# Patient Record
Sex: Female | Born: 1980 | Race: White | Hispanic: No | Marital: Married | Smoking: Current every day smoker
Health system: Southern US, Community
[De-identification: ages and names within clinical notes are randomized; demographics above are authoritative.]

## PROBLEM LIST (undated history)

## (undated) VITALS — BP 113/73 | HR 84 | Temp 98.3°F | Resp 16 | Ht 64.5 in | Wt 186.0 lb

## (undated) VITALS — BP 110/76 | HR 88 | Temp 98.2°F | Resp 16 | Ht 63.0 in | Wt 182.0 lb

## (undated) DIAGNOSIS — K648 Other hemorrhoids: Secondary | ICD-10-CM

## (undated) DIAGNOSIS — Z87410 Personal history of cervical dysplasia: Secondary | ICD-10-CM

## (undated) DIAGNOSIS — F4310 Post-traumatic stress disorder, unspecified: Secondary | ICD-10-CM

## (undated) DIAGNOSIS — M419 Scoliosis, unspecified: Secondary | ICD-10-CM

## (undated) DIAGNOSIS — F1010 Alcohol abuse, uncomplicated: Secondary | ICD-10-CM

## (undated) DIAGNOSIS — F329 Major depressive disorder, single episode, unspecified: Secondary | ICD-10-CM

## (undated) DIAGNOSIS — K644 Residual hemorrhoidal skin tags: Secondary | ICD-10-CM

## (undated) DIAGNOSIS — K6289 Other specified diseases of anus and rectum: Secondary | ICD-10-CM

## (undated) HISTORY — PX: FOOT SURGERY: SHX648

## (undated) HISTORY — PX: PELVIC FRACTURE SURGERY: SHX119

## (undated) HISTORY — PX: KNEE SURGERY: SHX244

---
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
## 1998-03-18 ENCOUNTER — Encounter: Admission: RE | Admit: 1998-03-18 | Discharge: 1998-03-18 | Payer: Self-pay | Admitting: Family Medicine

## 1998-06-17 ENCOUNTER — Encounter: Admission: RE | Admit: 1998-06-17 | Discharge: 1998-06-17 | Payer: Self-pay | Admitting: Family Medicine

## 1998-06-27 ENCOUNTER — Encounter: Admission: RE | Admit: 1998-06-27 | Discharge: 1998-06-27 | Payer: Self-pay | Admitting: Family Medicine

## 1998-11-12 ENCOUNTER — Encounter: Admission: RE | Admit: 1998-11-12 | Discharge: 1998-11-12 | Payer: Self-pay | Admitting: Family Medicine

## 1998-12-11 ENCOUNTER — Encounter: Admission: RE | Admit: 1998-12-11 | Discharge: 1998-12-11 | Payer: Self-pay | Admitting: Family Medicine

## 1998-12-25 ENCOUNTER — Encounter: Admission: RE | Admit: 1998-12-25 | Discharge: 1998-12-25 | Payer: Self-pay | Admitting: Family Medicine

## 1999-01-01 ENCOUNTER — Encounter: Admission: RE | Admit: 1999-01-01 | Discharge: 1999-01-01 | Payer: Self-pay | Admitting: Family Medicine

## 1999-04-02 ENCOUNTER — Encounter: Admission: RE | Admit: 1999-04-02 | Discharge: 1999-04-02 | Payer: Self-pay | Admitting: Family Medicine

## 1999-07-14 ENCOUNTER — Encounter: Admission: RE | Admit: 1999-07-14 | Discharge: 1999-07-14 | Payer: Self-pay | Admitting: Family Medicine

## 1999-07-15 ENCOUNTER — Encounter: Payer: Self-pay | Admitting: Sports Medicine

## 1999-07-15 ENCOUNTER — Encounter: Admission: RE | Admit: 1999-07-15 | Discharge: 1999-07-15 | Payer: Self-pay | Admitting: Sports Medicine

## 1999-07-16 ENCOUNTER — Encounter: Admission: RE | Admit: 1999-07-16 | Discharge: 1999-07-16 | Payer: Self-pay | Admitting: Family Medicine

## 1999-07-21 ENCOUNTER — Encounter: Admission: RE | Admit: 1999-07-21 | Discharge: 1999-07-21 | Payer: Self-pay | Admitting: Family Medicine

## 1999-08-13 ENCOUNTER — Encounter: Admission: RE | Admit: 1999-08-13 | Discharge: 1999-08-13 | Payer: Self-pay | Admitting: Family Medicine

## 1999-12-15 ENCOUNTER — Other Ambulatory Visit: Admission: RE | Admit: 1999-12-15 | Discharge: 1999-12-15 | Payer: Self-pay | Admitting: Internal Medicine

## 2000-01-10 ENCOUNTER — Emergency Department (HOSPITAL_COMMUNITY): Admission: EM | Admit: 2000-01-10 | Discharge: 2000-01-10 | Payer: Self-pay | Admitting: Emergency Medicine

## 2000-01-10 ENCOUNTER — Encounter: Payer: Self-pay | Admitting: Emergency Medicine

## 2000-09-07 ENCOUNTER — Encounter: Admission: RE | Admit: 2000-09-07 | Discharge: 2000-09-07 | Payer: Self-pay | Admitting: Family Medicine

## 2000-09-27 ENCOUNTER — Other Ambulatory Visit: Admission: RE | Admit: 2000-09-27 | Discharge: 2000-09-27 | Payer: Self-pay | Admitting: *Deleted

## 2000-09-27 ENCOUNTER — Ambulatory Visit (HOSPITAL_COMMUNITY): Admission: RE | Admit: 2000-09-27 | Discharge: 2000-09-27 | Payer: Self-pay | Admitting: *Deleted

## 2000-09-27 ENCOUNTER — Encounter: Admission: RE | Admit: 2000-09-27 | Discharge: 2000-09-27 | Payer: Self-pay | Admitting: Family Medicine

## 2000-10-04 ENCOUNTER — Encounter: Admission: RE | Admit: 2000-10-04 | Discharge: 2000-10-04 | Payer: Self-pay | Admitting: Family Medicine

## 2000-10-27 ENCOUNTER — Encounter: Admission: RE | Admit: 2000-10-27 | Discharge: 2000-10-27 | Payer: Self-pay | Admitting: Family Medicine

## 2000-11-22 ENCOUNTER — Ambulatory Visit (HOSPITAL_COMMUNITY): Admission: RE | Admit: 2000-11-22 | Discharge: 2000-11-22 | Payer: Self-pay | Admitting: Family Medicine

## 2000-11-22 ENCOUNTER — Encounter: Admission: RE | Admit: 2000-11-22 | Discharge: 2000-11-22 | Payer: Self-pay | Admitting: Family Medicine

## 2000-12-20 ENCOUNTER — Encounter: Admission: RE | Admit: 2000-12-20 | Discharge: 2000-12-20 | Payer: Self-pay | Admitting: Family Medicine

## 2000-12-30 ENCOUNTER — Ambulatory Visit (HOSPITAL_COMMUNITY): Admission: RE | Admit: 2000-12-30 | Discharge: 2000-12-30 | Payer: Self-pay

## 2001-01-12 ENCOUNTER — Inpatient Hospital Stay (HOSPITAL_COMMUNITY): Admission: AD | Admit: 2001-01-12 | Discharge: 2001-01-12 | Payer: Self-pay | Admitting: Obstetrics

## 2001-02-02 ENCOUNTER — Encounter: Admission: RE | Admit: 2001-02-02 | Discharge: 2001-02-02 | Payer: Self-pay | Admitting: Family Medicine

## 2001-02-03 ENCOUNTER — Encounter: Admission: RE | Admit: 2001-02-03 | Discharge: 2001-02-03 | Payer: Self-pay | Admitting: Family Medicine

## 2001-02-14 ENCOUNTER — Encounter: Admission: RE | Admit: 2001-02-14 | Discharge: 2001-02-14 | Payer: Self-pay | Admitting: Family Medicine

## 2001-03-01 ENCOUNTER — Encounter: Admission: RE | Admit: 2001-03-01 | Discharge: 2001-03-01 | Payer: Self-pay | Admitting: Family Medicine

## 2001-03-18 ENCOUNTER — Encounter: Admission: RE | Admit: 2001-03-18 | Discharge: 2001-03-18 | Payer: Self-pay | Admitting: Family Medicine

## 2001-03-25 ENCOUNTER — Encounter: Admission: RE | Admit: 2001-03-25 | Discharge: 2001-03-25 | Payer: Self-pay | Admitting: Family Medicine

## 2001-03-26 ENCOUNTER — Inpatient Hospital Stay (HOSPITAL_COMMUNITY): Admission: AD | Admit: 2001-03-26 | Discharge: 2001-03-26 | Payer: Self-pay | Admitting: *Deleted

## 2001-03-28 ENCOUNTER — Encounter: Admission: RE | Admit: 2001-03-28 | Discharge: 2001-03-28 | Payer: Self-pay | Admitting: Family Medicine

## 2001-03-29 ENCOUNTER — Inpatient Hospital Stay (HOSPITAL_COMMUNITY): Admission: AD | Admit: 2001-03-29 | Discharge: 2001-03-29 | Payer: Self-pay | Admitting: Obstetrics

## 2001-04-06 ENCOUNTER — Encounter: Admission: RE | Admit: 2001-04-06 | Discharge: 2001-04-06 | Payer: Self-pay | Admitting: Family Medicine

## 2001-04-11 ENCOUNTER — Encounter: Admission: RE | Admit: 2001-04-11 | Discharge: 2001-04-11 | Payer: Self-pay | Admitting: Family Medicine

## 2001-04-15 ENCOUNTER — Encounter (HOSPITAL_COMMUNITY): Admission: RE | Admit: 2001-04-15 | Discharge: 2001-04-19 | Payer: Self-pay | Admitting: *Deleted

## 2001-04-19 ENCOUNTER — Encounter: Admission: RE | Admit: 2001-04-19 | Discharge: 2001-04-19 | Payer: Self-pay | Admitting: Sports Medicine

## 2001-04-21 ENCOUNTER — Inpatient Hospital Stay (HOSPITAL_COMMUNITY): Admission: AD | Admit: 2001-04-21 | Discharge: 2001-04-24 | Payer: Self-pay | Admitting: Obstetrics & Gynecology

## 2001-10-24 ENCOUNTER — Encounter: Admission: RE | Admit: 2001-10-24 | Discharge: 2001-10-24 | Payer: Self-pay | Admitting: Family Medicine

## 2001-11-09 ENCOUNTER — Other Ambulatory Visit: Admission: RE | Admit: 2001-11-09 | Discharge: 2001-11-09 | Payer: Self-pay | Admitting: *Deleted

## 2001-11-09 ENCOUNTER — Encounter: Admission: RE | Admit: 2001-11-09 | Discharge: 2001-11-09 | Payer: Self-pay | Admitting: Family Medicine

## 2001-11-28 ENCOUNTER — Encounter: Admission: RE | Admit: 2001-11-28 | Discharge: 2001-11-28 | Payer: Self-pay | Admitting: Family Medicine

## 2002-04-03 ENCOUNTER — Encounter: Admission: RE | Admit: 2002-04-03 | Discharge: 2002-04-03 | Payer: Self-pay | Admitting: Family Medicine

## 2002-06-26 ENCOUNTER — Encounter: Admission: RE | Admit: 2002-06-26 | Discharge: 2002-06-26 | Payer: Self-pay | Admitting: Sports Medicine

## 2002-06-26 ENCOUNTER — Encounter: Admission: RE | Admit: 2002-06-26 | Discharge: 2002-06-26 | Payer: Self-pay | Admitting: Family Medicine

## 2002-06-26 ENCOUNTER — Encounter: Payer: Self-pay | Admitting: Sports Medicine

## 2002-06-28 ENCOUNTER — Encounter: Admission: RE | Admit: 2002-06-28 | Discharge: 2002-06-28 | Payer: Self-pay | Admitting: Family Medicine

## 2002-11-27 ENCOUNTER — Encounter (INDEPENDENT_AMBULATORY_CARE_PROVIDER_SITE_OTHER): Payer: Self-pay | Admitting: Specialist

## 2002-11-27 ENCOUNTER — Encounter: Admission: RE | Admit: 2002-11-27 | Discharge: 2002-11-27 | Payer: Self-pay | Admitting: Sports Medicine

## 2002-11-27 ENCOUNTER — Other Ambulatory Visit: Admission: RE | Admit: 2002-11-27 | Discharge: 2002-11-27 | Payer: Self-pay | Admitting: Family Medicine

## 2002-12-04 ENCOUNTER — Encounter: Admission: RE | Admit: 2002-12-04 | Discharge: 2002-12-04 | Payer: Self-pay | Admitting: Family Medicine

## 2003-01-09 ENCOUNTER — Encounter: Admission: RE | Admit: 2003-01-09 | Discharge: 2003-01-09 | Payer: Self-pay | Admitting: Family Medicine

## 2003-02-13 ENCOUNTER — Encounter: Admission: RE | Admit: 2003-02-13 | Discharge: 2003-02-13 | Payer: Self-pay | Admitting: Family Medicine

## 2003-02-28 ENCOUNTER — Encounter: Admission: RE | Admit: 2003-02-28 | Discharge: 2003-02-28 | Payer: Self-pay | Admitting: Family Medicine

## 2003-05-15 ENCOUNTER — Encounter: Admission: RE | Admit: 2003-05-15 | Discharge: 2003-05-15 | Payer: Self-pay | Admitting: Obstetrics and Gynecology

## 2003-05-17 ENCOUNTER — Encounter: Admission: RE | Admit: 2003-05-17 | Discharge: 2003-05-17 | Payer: Self-pay | Admitting: Obstetrics and Gynecology

## 2003-06-11 ENCOUNTER — Encounter (INDEPENDENT_AMBULATORY_CARE_PROVIDER_SITE_OTHER): Payer: Self-pay | Admitting: *Deleted

## 2003-06-11 ENCOUNTER — Ambulatory Visit (HOSPITAL_COMMUNITY): Admission: RE | Admit: 2003-06-11 | Discharge: 2003-06-11 | Payer: Self-pay | Admitting: Family Medicine

## 2003-06-11 HISTORY — PX: CERVICAL BIOPSY  W/ LOOP ELECTRODE EXCISION: SUR135

## 2003-06-29 ENCOUNTER — Encounter: Admission: RE | Admit: 2003-06-29 | Discharge: 2003-06-29 | Payer: Self-pay | Admitting: Family Medicine

## 2003-09-27 ENCOUNTER — Encounter: Admission: RE | Admit: 2003-09-27 | Discharge: 2003-09-27 | Payer: Self-pay | Admitting: Sports Medicine

## 2003-12-20 ENCOUNTER — Encounter: Admission: RE | Admit: 2003-12-20 | Discharge: 2003-12-20 | Payer: Self-pay | Admitting: Family Medicine

## 2004-06-03 ENCOUNTER — Ambulatory Visit: Payer: Self-pay | Admitting: Family Medicine

## 2004-09-10 ENCOUNTER — Ambulatory Visit: Payer: Self-pay | Admitting: Family Medicine

## 2004-10-09 ENCOUNTER — Other Ambulatory Visit: Admission: RE | Admit: 2004-10-09 | Discharge: 2004-10-09 | Payer: Self-pay | Admitting: Family Medicine

## 2004-10-09 ENCOUNTER — Ambulatory Visit: Payer: Self-pay | Admitting: Family Medicine

## 2004-10-14 ENCOUNTER — Ambulatory Visit (HOSPITAL_COMMUNITY): Admission: RE | Admit: 2004-10-14 | Discharge: 2004-10-14 | Payer: Self-pay | Admitting: Family Medicine

## 2004-10-28 ENCOUNTER — Ambulatory Visit: Payer: Self-pay | Admitting: Family Medicine

## 2004-11-19 ENCOUNTER — Inpatient Hospital Stay (HOSPITAL_COMMUNITY): Admission: AD | Admit: 2004-11-19 | Discharge: 2004-11-19 | Payer: Self-pay | Admitting: Obstetrics and Gynecology

## 2004-11-25 ENCOUNTER — Ambulatory Visit: Payer: Self-pay | Admitting: Family Medicine

## 2004-12-01 ENCOUNTER — Ambulatory Visit (HOSPITAL_COMMUNITY): Admission: RE | Admit: 2004-12-01 | Discharge: 2004-12-01 | Payer: Self-pay | Admitting: Family Medicine

## 2004-12-30 ENCOUNTER — Ambulatory Visit: Payer: Self-pay | Admitting: Family Medicine

## 2005-02-02 ENCOUNTER — Ambulatory Visit: Payer: Self-pay | Admitting: Sports Medicine

## 2005-02-04 ENCOUNTER — Ambulatory Visit: Payer: Self-pay | Admitting: Family Medicine

## 2005-02-05 ENCOUNTER — Ambulatory Visit (HOSPITAL_COMMUNITY): Admission: RE | Admit: 2005-02-05 | Discharge: 2005-02-05 | Payer: Self-pay | Admitting: Family Medicine

## 2005-03-04 ENCOUNTER — Ambulatory Visit: Payer: Self-pay | Admitting: Family Medicine

## 2005-03-16 ENCOUNTER — Inpatient Hospital Stay (HOSPITAL_COMMUNITY): Admission: AD | Admit: 2005-03-16 | Discharge: 2005-03-16 | Payer: Self-pay | Admitting: Obstetrics & Gynecology

## 2005-03-16 ENCOUNTER — Ambulatory Visit: Payer: Self-pay | Admitting: *Deleted

## 2005-04-07 ENCOUNTER — Ambulatory Visit: Payer: Self-pay | Admitting: Family Medicine

## 2005-04-14 ENCOUNTER — Ambulatory Visit: Payer: Self-pay | Admitting: Family Medicine

## 2005-04-17 ENCOUNTER — Inpatient Hospital Stay (HOSPITAL_COMMUNITY): Admission: AD | Admit: 2005-04-17 | Discharge: 2005-04-17 | Payer: Self-pay | Admitting: *Deleted

## 2005-04-22 ENCOUNTER — Ambulatory Visit: Payer: Self-pay | Admitting: Family Medicine

## 2005-04-27 ENCOUNTER — Ambulatory Visit: Payer: Self-pay | Admitting: Family Medicine

## 2005-04-27 ENCOUNTER — Inpatient Hospital Stay (HOSPITAL_COMMUNITY): Admission: AD | Admit: 2005-04-27 | Discharge: 2005-04-27 | Payer: Self-pay | Admitting: Obstetrics & Gynecology

## 2005-04-30 ENCOUNTER — Ambulatory Visit: Payer: Self-pay | Admitting: Family Medicine

## 2005-05-05 ENCOUNTER — Ambulatory Visit: Payer: Self-pay | Admitting: Family Medicine

## 2005-05-06 ENCOUNTER — Ambulatory Visit: Payer: Self-pay | Admitting: Family Medicine

## 2005-05-06 ENCOUNTER — Inpatient Hospital Stay (HOSPITAL_COMMUNITY): Admission: AD | Admit: 2005-05-06 | Discharge: 2005-05-08 | Payer: Self-pay | Admitting: Obstetrics

## 2005-05-06 ENCOUNTER — Ambulatory Visit: Payer: Self-pay | Admitting: *Deleted

## 2005-05-08 HISTORY — PX: TUBAL LIGATION: SHX77

## 2005-07-07 ENCOUNTER — Ambulatory Visit: Payer: Self-pay | Admitting: Family Medicine

## 2006-07-12 ENCOUNTER — Ambulatory Visit: Payer: Self-pay | Admitting: Sports Medicine

## 2006-08-11 ENCOUNTER — Ambulatory Visit: Payer: Self-pay | Admitting: Family Medicine

## 2006-09-24 ENCOUNTER — Encounter (INDEPENDENT_AMBULATORY_CARE_PROVIDER_SITE_OTHER): Payer: Self-pay | Admitting: *Deleted

## 2006-09-24 LAB — CONVERTED CEMR LAB

## 2006-10-05 ENCOUNTER — Encounter: Payer: Self-pay | Admitting: Family Medicine

## 2006-10-05 ENCOUNTER — Encounter (INDEPENDENT_AMBULATORY_CARE_PROVIDER_SITE_OTHER): Payer: Self-pay | Admitting: Specialist

## 2006-10-05 ENCOUNTER — Ambulatory Visit: Payer: Self-pay | Admitting: Family Medicine

## 2006-10-05 LAB — CONVERTED CEMR LAB: Chlamydia, DNA Probe: NEGATIVE

## 2006-10-12 ENCOUNTER — Ambulatory Visit: Payer: Self-pay | Admitting: Family Medicine

## 2006-10-14 DIAGNOSIS — F339 Major depressive disorder, recurrent, unspecified: Secondary | ICD-10-CM | POA: Insufficient documentation

## 2006-10-15 ENCOUNTER — Encounter (INDEPENDENT_AMBULATORY_CARE_PROVIDER_SITE_OTHER): Payer: Self-pay | Admitting: *Deleted

## 2007-01-27 ENCOUNTER — Encounter: Payer: Self-pay | Admitting: Family Medicine

## 2007-06-16 ENCOUNTER — Ambulatory Visit: Payer: Self-pay | Admitting: Family Medicine

## 2007-06-16 ENCOUNTER — Telehealth (INDEPENDENT_AMBULATORY_CARE_PROVIDER_SITE_OTHER): Payer: Self-pay | Admitting: *Deleted

## 2008-03-14 ENCOUNTER — Encounter: Payer: Self-pay | Admitting: *Deleted

## 2008-03-14 ENCOUNTER — Ambulatory Visit: Payer: Self-pay | Admitting: Family Medicine

## 2008-03-14 LAB — CONVERTED CEMR LAB
Bilirubin Urine: NEGATIVE
Blood in Urine, dipstick: NEGATIVE
Ketones, urine, test strip: NEGATIVE
Nitrite: NEGATIVE
Protein, U semiquant: NEGATIVE
Urobilinogen, UA: 0.2

## 2008-03-16 ENCOUNTER — Encounter: Payer: Self-pay | Admitting: Family Medicine

## 2008-03-16 ENCOUNTER — Encounter: Payer: Self-pay | Admitting: *Deleted

## 2008-03-16 ENCOUNTER — Ambulatory Visit: Payer: Self-pay | Admitting: Family Medicine

## 2008-03-16 ENCOUNTER — Telehealth: Payer: Self-pay | Admitting: Family Medicine

## 2008-03-16 LAB — CONVERTED CEMR LAB
Beta hcg, urine, semiquantitative: NEGATIVE
Bilirubin Urine: NEGATIVE
Blood in Urine, dipstick: NEGATIVE
Ketones, urine, test strip: NEGATIVE
Protein, U semiquant: NEGATIVE
Specific Gravity, Urine: 1.015
Urobilinogen, UA: 0.2
Whiff Test: NEGATIVE

## 2008-03-17 ENCOUNTER — Encounter: Payer: Self-pay | Admitting: Family Medicine

## 2008-03-19 LAB — CONVERTED CEMR LAB
BUN: 10 mg/dL (ref 6–23)
CO2: 22 meq/L (ref 19–32)
Calcium: 9.7 mg/dL (ref 8.4–10.5)
Chlamydia, DNA Probe: NEGATIVE
Chloride: 104 meq/L (ref 96–112)
MCHC: 34.5 g/dL (ref 30.0–36.0)
RBC: 4.84 M/uL (ref 3.87–5.11)

## 2008-03-20 ENCOUNTER — Ambulatory Visit: Payer: Self-pay | Admitting: Family Medicine

## 2008-03-20 ENCOUNTER — Encounter: Payer: Self-pay | Admitting: Family Medicine

## 2008-03-20 DIAGNOSIS — M549 Dorsalgia, unspecified: Secondary | ICD-10-CM | POA: Insufficient documentation

## 2008-03-22 ENCOUNTER — Ambulatory Visit: Payer: Self-pay | Admitting: Family Medicine

## 2008-03-22 ENCOUNTER — Telehealth: Payer: Self-pay | Admitting: *Deleted

## 2008-03-22 LAB — CONVERTED CEMR LAB
Bilirubin Urine: NEGATIVE
Blood in Urine, dipstick: NEGATIVE
Glucose, Urine, Semiquant: NEGATIVE
Ketones, urine, test strip: NEGATIVE
Nitrite: NEGATIVE
Specific Gravity, Urine: <1.005
Urobilinogen, UA: 0.2
WBC Urine, dipstick: NEGATIVE
pH: 6

## 2008-03-23 ENCOUNTER — Encounter: Payer: Self-pay | Admitting: Family Medicine

## 2008-03-23 ENCOUNTER — Ambulatory Visit (HOSPITAL_COMMUNITY): Admission: RE | Admit: 2008-03-23 | Discharge: 2008-03-23 | Payer: Self-pay | Admitting: Family Medicine

## 2008-03-23 ENCOUNTER — Ambulatory Visit: Payer: Self-pay | Admitting: Family Medicine

## 2008-03-25 ENCOUNTER — Encounter: Payer: Self-pay | Admitting: Family Medicine

## 2008-04-02 ENCOUNTER — Telehealth (INDEPENDENT_AMBULATORY_CARE_PROVIDER_SITE_OTHER): Payer: Self-pay | Admitting: Family Medicine

## 2008-04-04 ENCOUNTER — Telehealth: Payer: Self-pay | Admitting: Family Medicine

## 2008-04-05 ENCOUNTER — Ambulatory Visit: Payer: Self-pay | Admitting: Family Medicine

## 2008-04-05 ENCOUNTER — Encounter: Payer: Self-pay | Admitting: Family Medicine

## 2008-04-05 ENCOUNTER — Telehealth: Payer: Self-pay | Admitting: *Deleted

## 2008-04-09 ENCOUNTER — Telehealth: Payer: Self-pay | Admitting: *Deleted

## 2008-04-10 ENCOUNTER — Ambulatory Visit: Payer: Self-pay | Admitting: Family Medicine

## 2008-04-10 ENCOUNTER — Encounter: Payer: Self-pay | Admitting: Family Medicine

## 2008-04-10 ENCOUNTER — Telehealth: Payer: Self-pay | Admitting: Family Medicine

## 2008-04-16 ENCOUNTER — Encounter: Admission: RE | Admit: 2008-04-16 | Discharge: 2008-05-10 | Payer: Self-pay | Admitting: Family Medicine

## 2008-04-24 ENCOUNTER — Ambulatory Visit: Payer: Self-pay | Admitting: Family Medicine

## 2008-06-12 ENCOUNTER — Ambulatory Visit: Payer: Self-pay | Admitting: Family Medicine

## 2008-06-13 ENCOUNTER — Telehealth: Payer: Self-pay | Admitting: Family Medicine

## 2008-07-19 ENCOUNTER — Encounter: Payer: Self-pay | Admitting: Family Medicine

## 2008-07-25 ENCOUNTER — Telehealth: Payer: Self-pay | Admitting: *Deleted

## 2008-07-25 ENCOUNTER — Encounter: Payer: Self-pay | Admitting: Family Medicine

## 2008-07-25 ENCOUNTER — Ambulatory Visit: Payer: Self-pay | Admitting: Family Medicine

## 2008-07-25 DIAGNOSIS — K625 Hemorrhage of anus and rectum: Secondary | ICD-10-CM | POA: Insufficient documentation

## 2008-07-26 ENCOUNTER — Emergency Department (HOSPITAL_COMMUNITY): Admission: EM | Admit: 2008-07-26 | Discharge: 2008-07-26 | Payer: Self-pay | Admitting: Emergency Medicine

## 2008-07-26 ENCOUNTER — Telehealth: Payer: Self-pay | Admitting: Family Medicine

## 2008-07-30 LAB — CONVERTED CEMR LAB
HCT: 46.3 % — ABNORMAL HIGH (ref 36.0–46.0)
RDW: 12.4 % (ref 11.5–15.5)
WBC: 10.3 10*3/uL (ref 4.0–10.5)

## 2008-09-05 ENCOUNTER — Telehealth: Payer: Self-pay | Admitting: Family Medicine

## 2008-09-06 ENCOUNTER — Ambulatory Visit: Payer: Self-pay | Admitting: Family Medicine

## 2008-09-19 ENCOUNTER — Telehealth: Payer: Self-pay | Admitting: Family Medicine

## 2008-09-19 ENCOUNTER — Encounter: Payer: Self-pay | Admitting: *Deleted

## 2008-10-03 ENCOUNTER — Telehealth: Payer: Self-pay | Admitting: *Deleted

## 2008-10-11 ENCOUNTER — Ambulatory Visit: Payer: Self-pay | Admitting: Family Medicine

## 2008-10-11 ENCOUNTER — Telehealth: Payer: Self-pay | Admitting: Family Medicine

## 2008-11-07 ENCOUNTER — Telehealth: Payer: Self-pay | Admitting: *Deleted

## 2008-11-07 ENCOUNTER — Telehealth: Payer: Self-pay | Admitting: Family Medicine

## 2008-12-04 ENCOUNTER — Encounter: Payer: Self-pay | Admitting: Family Medicine

## 2008-12-04 ENCOUNTER — Telehealth: Payer: Self-pay | Admitting: *Deleted

## 2009-01-04 ENCOUNTER — Encounter
Admission: RE | Admit: 2009-01-04 | Discharge: 2009-01-04 | Payer: Self-pay | Admitting: Physical Medicine & Rehabilitation

## 2009-01-07 ENCOUNTER — Telehealth: Payer: Self-pay | Admitting: Family Medicine

## 2009-05-24 IMAGING — US US ABDOMEN COMPLETE
1 series · 14 of 25 positions shown · non-contrast
Comparison: None

CLINICAL DATA: Abdominal pain.  Nephrolithiasis versus
pyelonephritis.

ABDOMEN ULTRASOUND
TECHNIQUE: Complete abdominal ultrasound examination was performed
including evaluation of the liver, gallbladder, bile ducts,
pancreas, kidneys, spleen, IVC, and abdominal aorta.

[Series 1: unknown · 0.30mm/px · 14 of 90 slices shown]
[im 1/90]
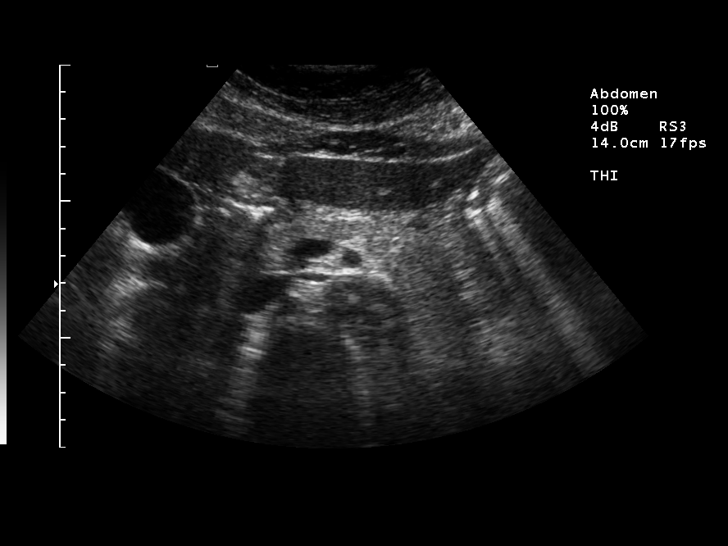
[im 8/90]
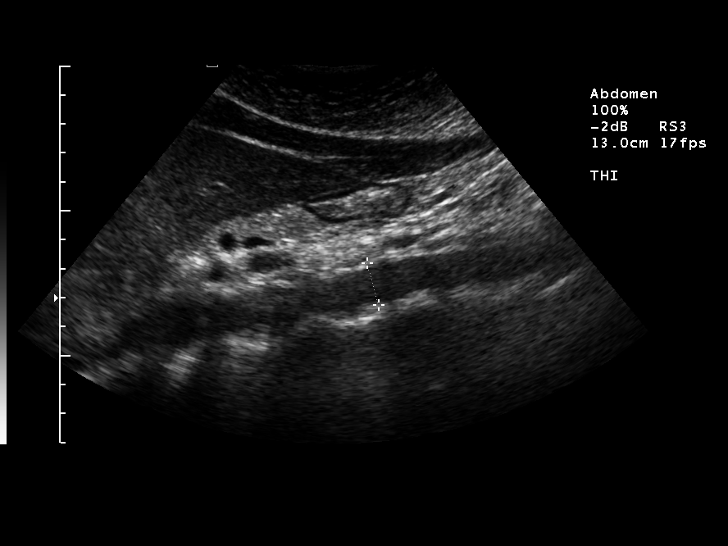
[im 15/90]
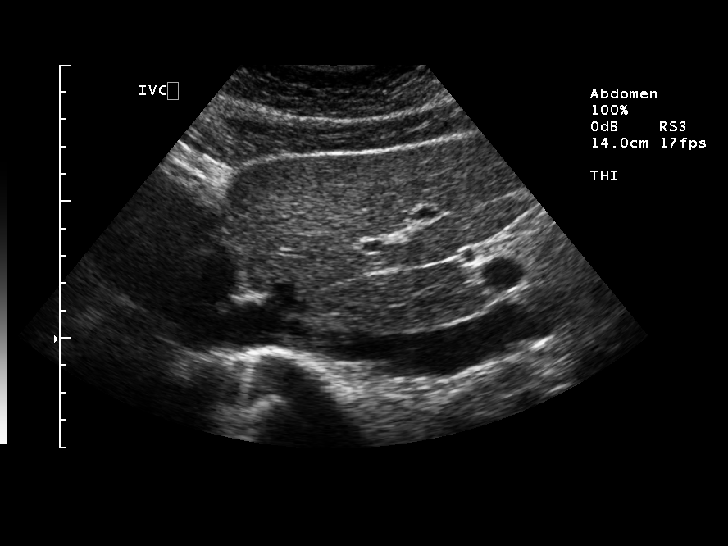
[im 23/90]
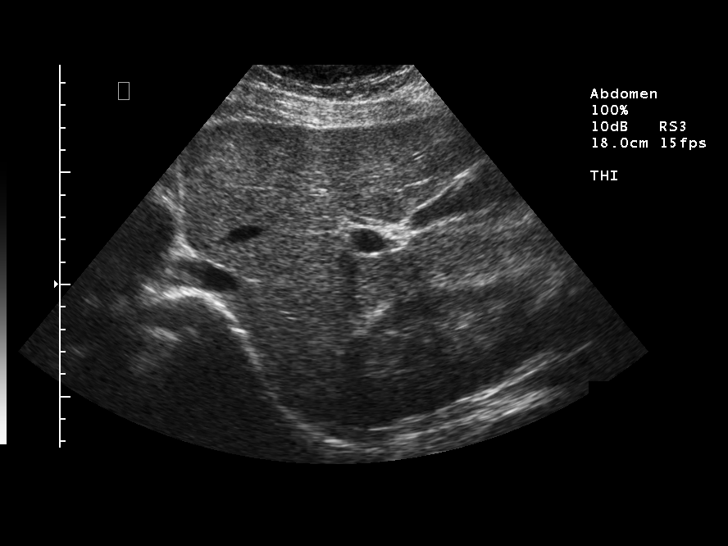
[im 30/90]
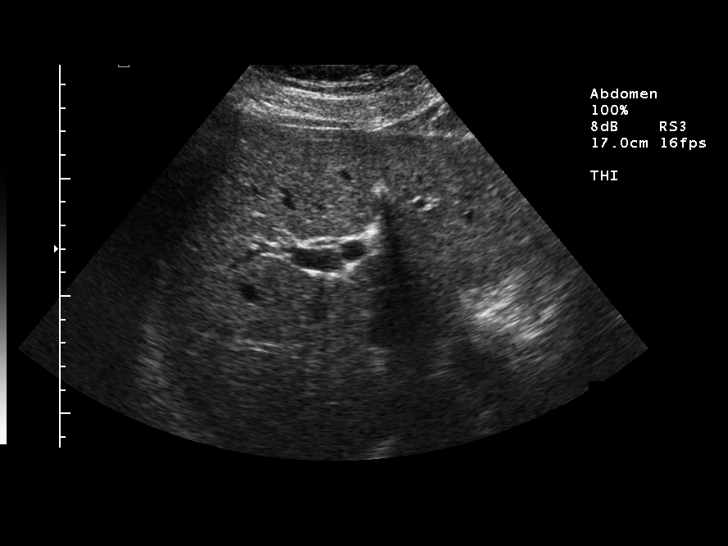
[im 34/90]
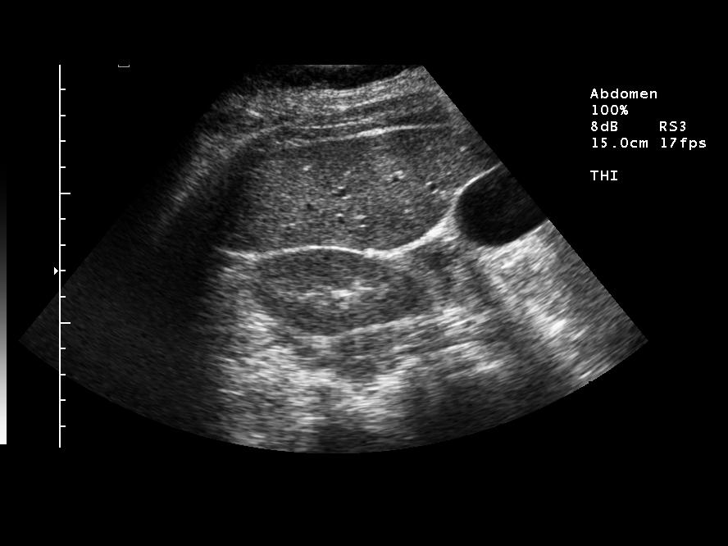
[im 41/90]
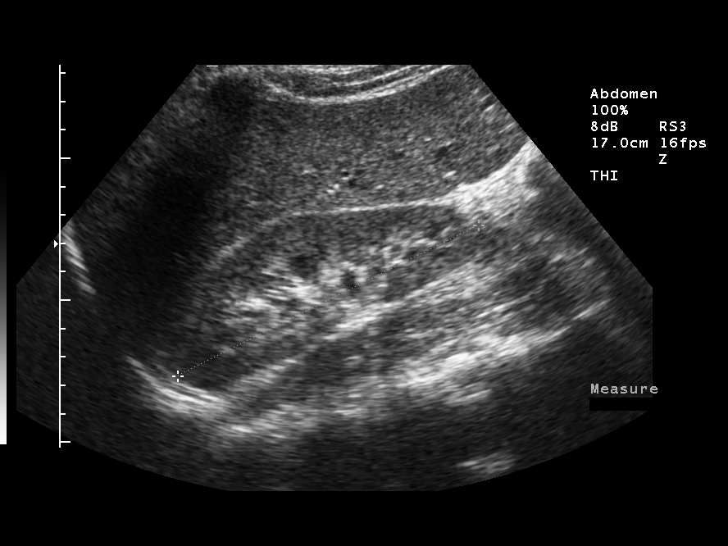
[im 49/90]
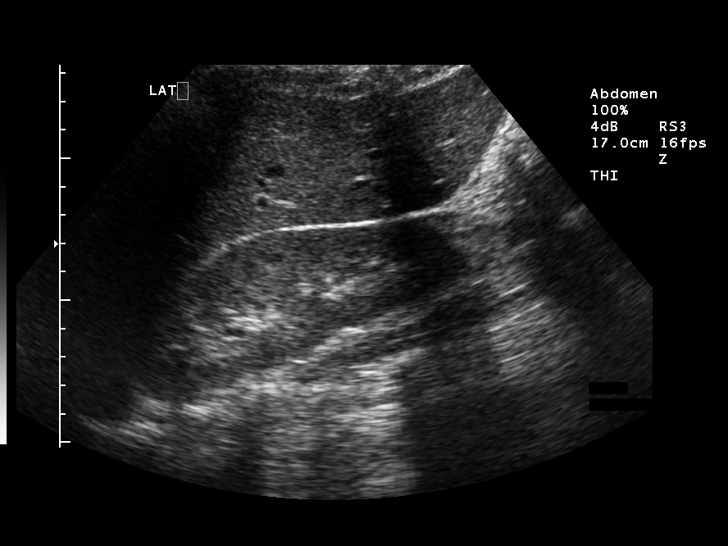
[im 56/90]
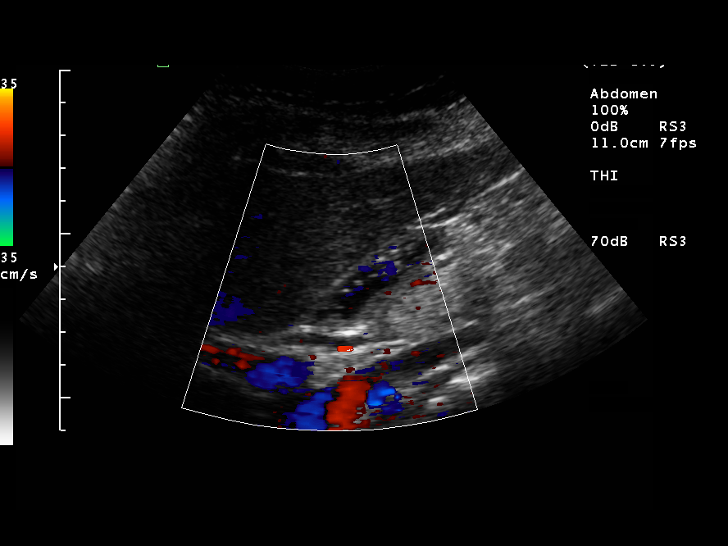
[im 60/90]
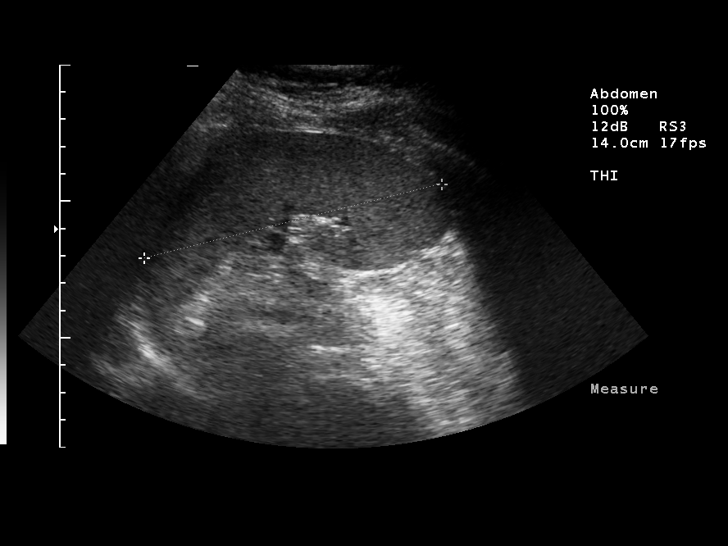
[im 67/90]
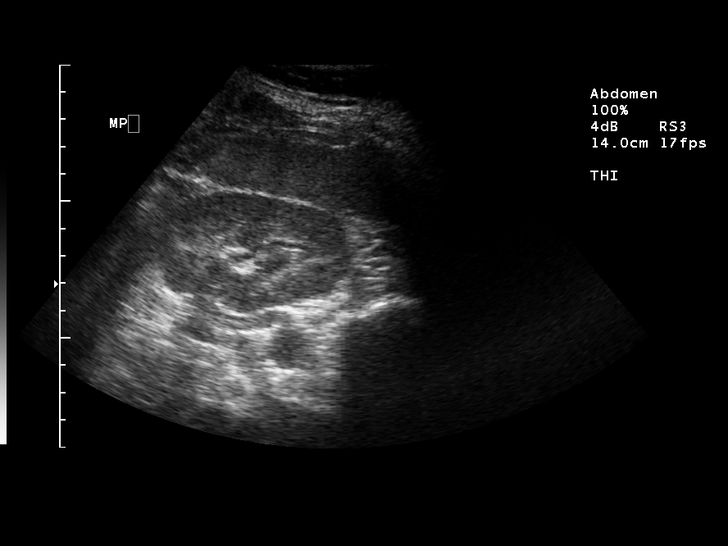
[im 75/90]
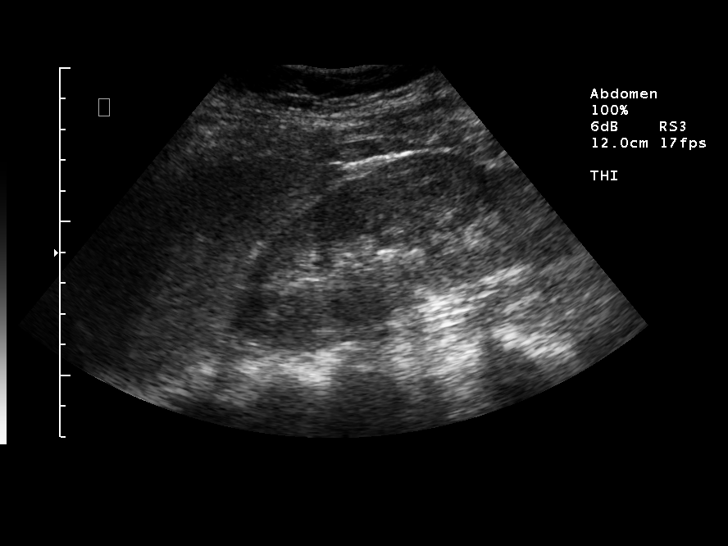
[im 82/90]
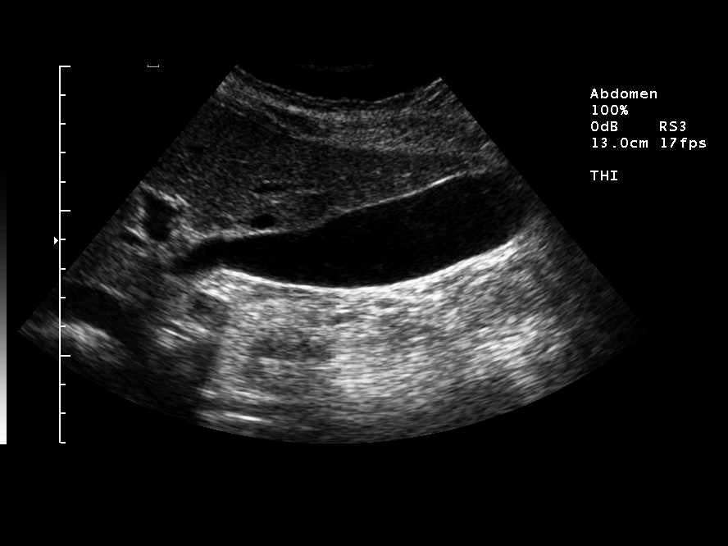
[im 90/90]
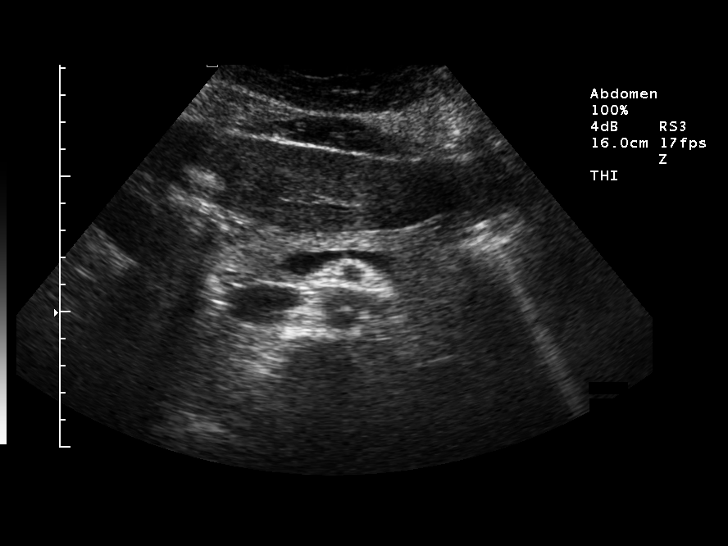

[14 of 25 positions shown; findings below may reference images not displayed]

FINDINGS: Gallbladder is normal without wall thickening, stone, or
pericholecystic fluid.  Sonographic Murphy's sign was not elicited.

Common duct normal at 4mm.

Liver, IVC, pancreas all within normal limits.

Spleen normal in size and echotexture.

Right kidney 11.8 cm and left kidney 12.2 cm.  No hydronephrosis.
No ultrasound evidence of renal calculi.

Abdominal aorta nonaneurysmal without ascites.
IMPRESSION: 1.  Normal abdominal ultrasound as described.  Please note that
ultrasound is of low sensitivity for both renal calculi and
pyelonephritis.  If these are clinical concerns, dedicated CT
should be considered.

## 2009-05-25 ENCOUNTER — Encounter (INDEPENDENT_AMBULATORY_CARE_PROVIDER_SITE_OTHER): Payer: Self-pay | Admitting: *Deleted

## 2009-05-25 DIAGNOSIS — F17200 Nicotine dependence, unspecified, uncomplicated: Secondary | ICD-10-CM | POA: Insufficient documentation

## 2009-05-25 DIAGNOSIS — Z720 Tobacco use: Secondary | ICD-10-CM | POA: Insufficient documentation

## 2009-05-28 ENCOUNTER — Encounter: Payer: Self-pay | Admitting: Family Medicine

## 2009-05-28 ENCOUNTER — Ambulatory Visit: Payer: Self-pay | Admitting: Family Medicine

## 2009-05-28 DIAGNOSIS — N938 Other specified abnormal uterine and vaginal bleeding: Secondary | ICD-10-CM | POA: Insufficient documentation

## 2009-05-28 DIAGNOSIS — N949 Unspecified condition associated with female genital organs and menstrual cycle: Secondary | ICD-10-CM

## 2009-05-28 LAB — CONVERTED CEMR LAB: Pap Smear: NORMAL

## 2009-05-30 LAB — CONVERTED CEMR LAB
Hemoglobin: 14.8 g/dL (ref 12.0–15.0)
MCHC: 32.7 g/dL (ref 30.0–36.0)
MCV: 98.1 fL (ref 78.0–100.0)
Platelets: 230 10*3/uL (ref 150–400)

## 2009-06-20 ENCOUNTER — Encounter: Payer: Self-pay | Admitting: Family Medicine

## 2009-06-20 ENCOUNTER — Ambulatory Visit: Payer: Self-pay | Admitting: Family Medicine

## 2009-06-20 LAB — CONVERTED CEMR LAB: hCG, Beta Chain, Quant, S: <2 milliintl units/mL

## 2009-06-27 ENCOUNTER — Ambulatory Visit (HOSPITAL_COMMUNITY): Admission: RE | Admit: 2009-06-27 | Discharge: 2009-06-27 | Payer: Self-pay | Admitting: Family Medicine

## 2009-07-03 ENCOUNTER — Encounter: Payer: Self-pay | Admitting: Family Medicine

## 2009-09-05 ENCOUNTER — Ambulatory Visit: Payer: Self-pay | Admitting: Family Medicine

## 2009-09-05 DIAGNOSIS — S139XXA Sprain of joints and ligaments of unspecified parts of neck, initial encounter: Secondary | ICD-10-CM | POA: Insufficient documentation

## 2010-01-27 ENCOUNTER — Encounter: Payer: Self-pay | Admitting: Family Medicine

## 2010-07-02 ENCOUNTER — Ambulatory Visit: Payer: Self-pay | Admitting: Family Medicine

## 2010-07-02 ENCOUNTER — Encounter: Payer: Self-pay | Admitting: Family Medicine

## 2010-07-02 DIAGNOSIS — F322 Major depressive disorder, single episode, severe without psychotic features: Secondary | ICD-10-CM | POA: Insufficient documentation

## 2010-07-02 DIAGNOSIS — F329 Major depressive disorder, single episode, unspecified: Secondary | ICD-10-CM | POA: Insufficient documentation

## 2010-07-28 ENCOUNTER — Ambulatory Visit: Payer: Self-pay | Admitting: Family Medicine

## 2010-07-30 ENCOUNTER — Encounter: Payer: Self-pay | Admitting: Family Medicine

## 2010-08-07 ENCOUNTER — Telehealth: Payer: Self-pay | Admitting: Family Medicine

## 2010-08-19 ENCOUNTER — Encounter: Payer: Self-pay | Admitting: Family Medicine

## 2010-08-20 LAB — CONVERTED CEMR LAB
ALT: 18 units/L (ref 0–35)
Albumin: 4.8 g/dL (ref 3.5–5.2)
Alkaline Phosphatase: 58 units/L (ref 39–117)
BUN: 10 mg/dL (ref 6–23)
Chloride: 103 meq/L (ref 96–112)
Creatinine, Ser: 0.7 mg/dL (ref 0.40–1.20)
Direct LDL: 172 mg/dL — ABNORMAL HIGH
Potassium: 4.3 meq/L (ref 3.5–5.3)

## 2010-08-28 IMAGING — US US TRANSVAGINAL NON-OB
1 series · 14 of 25 positions shown · non-contrast
Comparison: None

CLINICAL DATA: Dysfunctional uterine bleeding with bloating and
abdominal pain.  Irregular menses

TRANSABDOMINAL AND TRANSVAGINAL ULTRASOUND OF PELVIS
TECHNIQUE: Both transabdominal and transvaginal ultrasound
examinations of the pelvis were performed including evaluation of
the uterus, ovaries, adnexal regions, and pelvic cul-de-sac.

[Series 1: us transvaginal non-ob · 0.21mm/px · 14 of 39 slices shown]
[im 1/39]
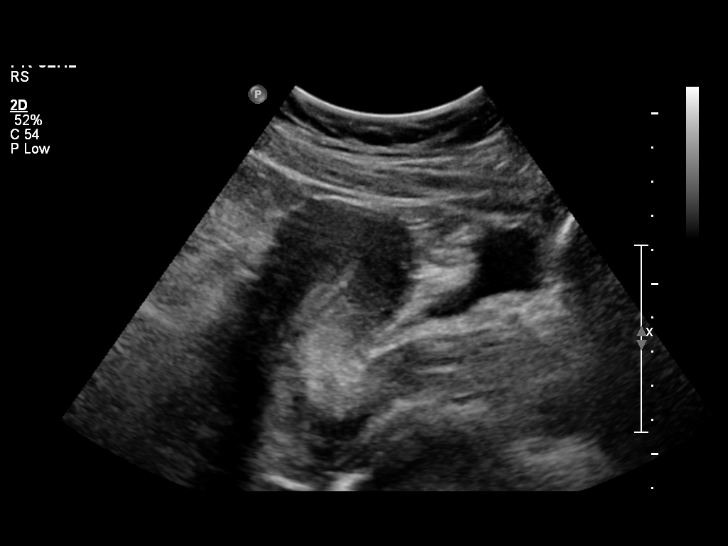
[im 4/39]
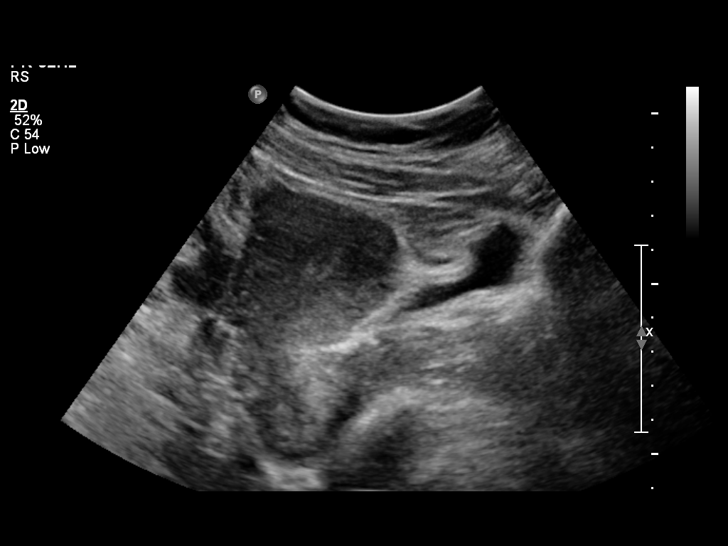
[im 7/39]
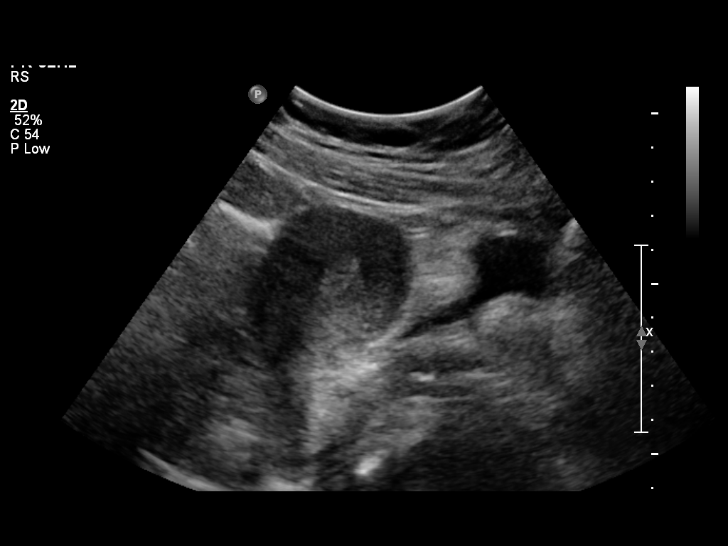
[im 10/39]
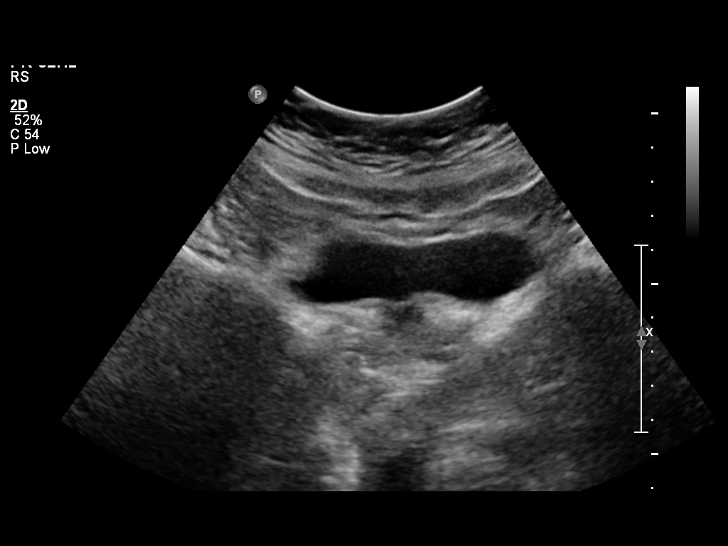
[im 13/39]
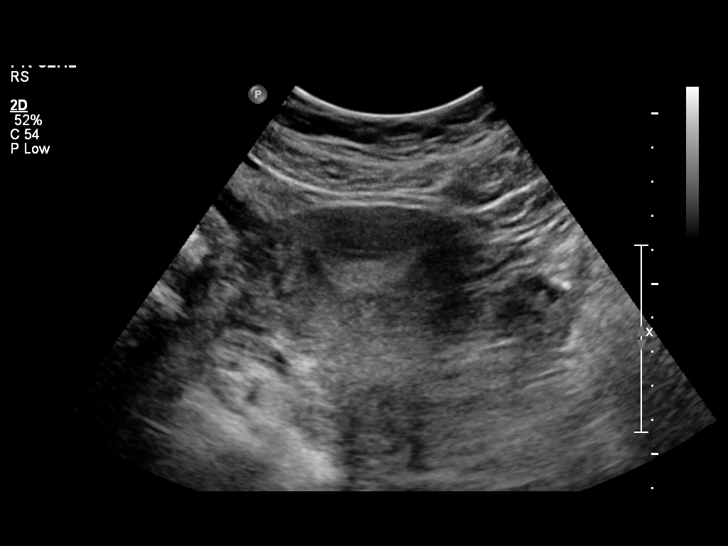
[im 15/39]
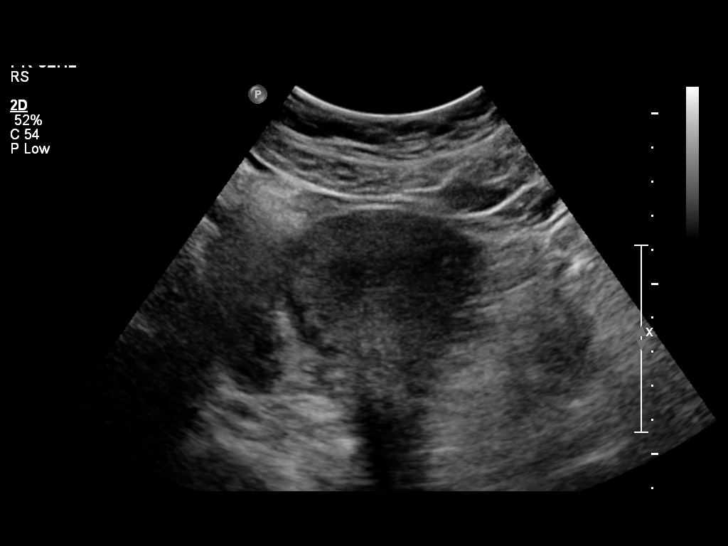
[im 18/39]
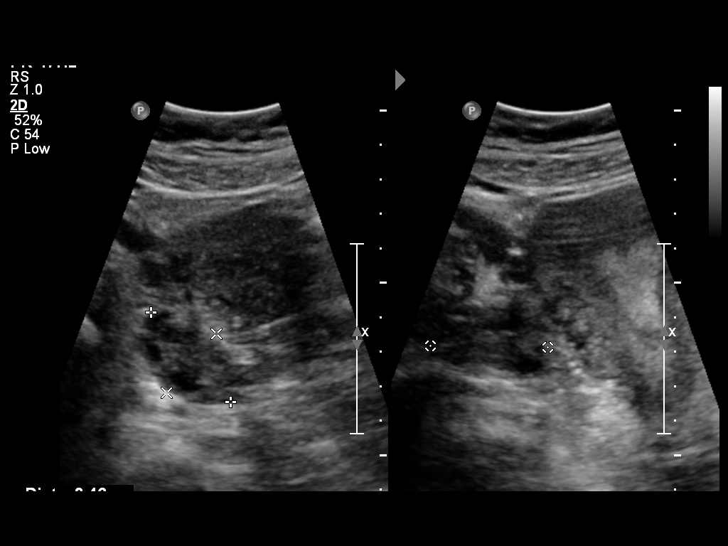
[im 21/39]
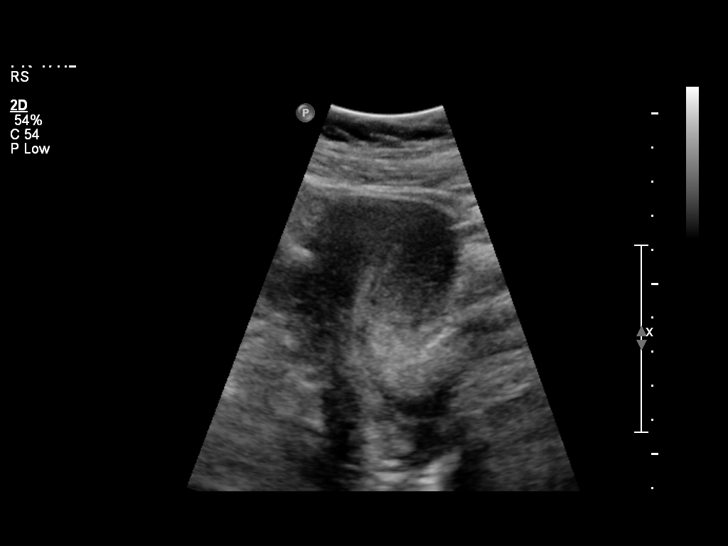
[im 24/39]
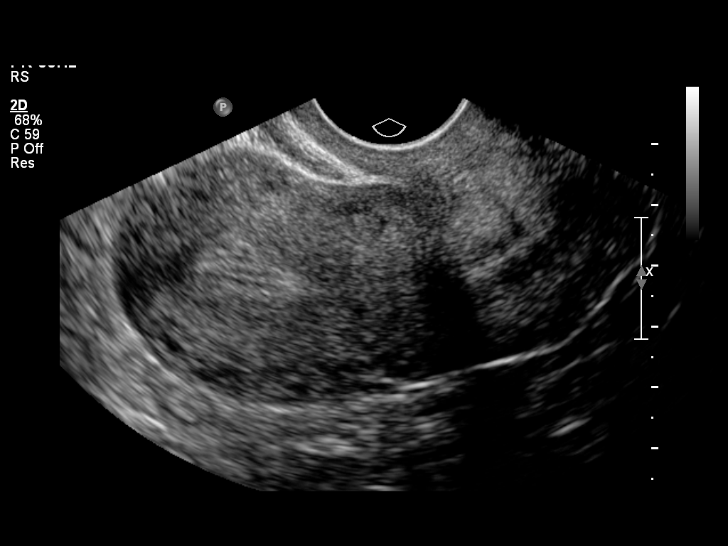
[im 26/39]
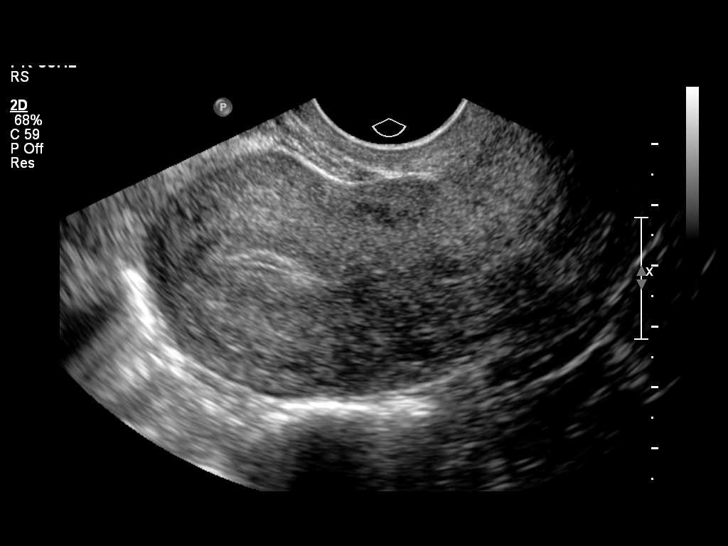
[im 29/39]
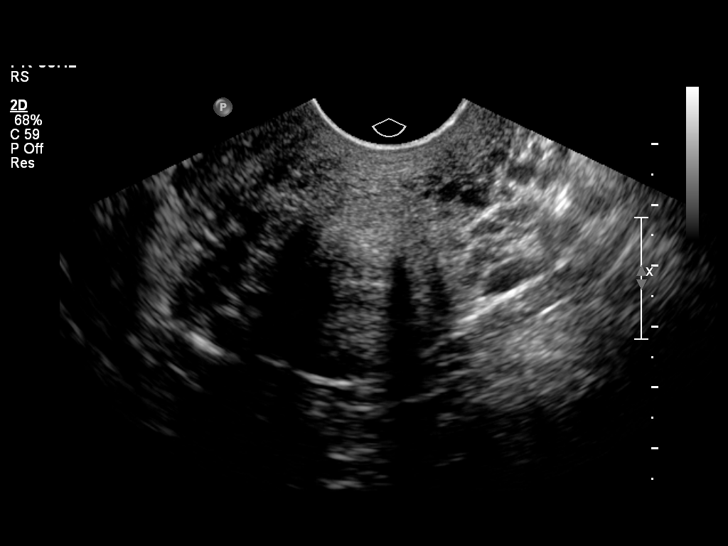
[im 32/39]
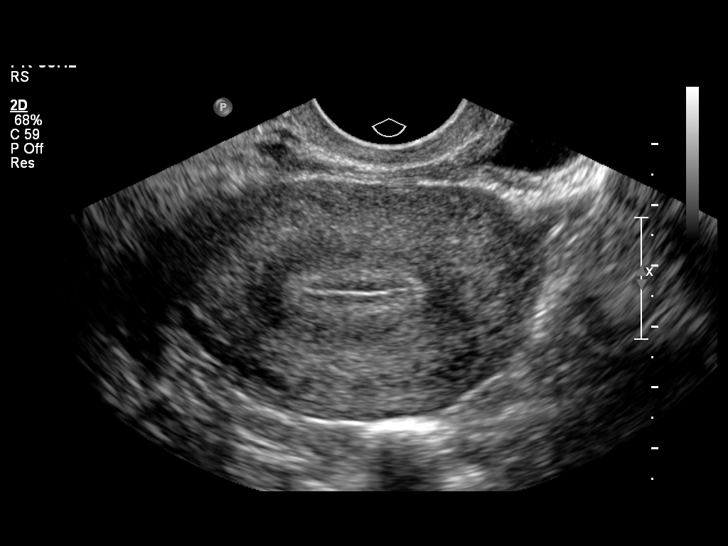
[im 35/39]
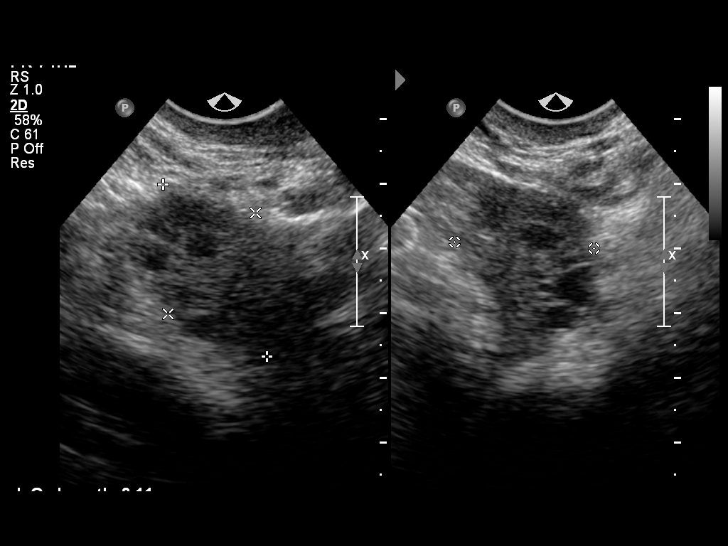
[im 39/39]
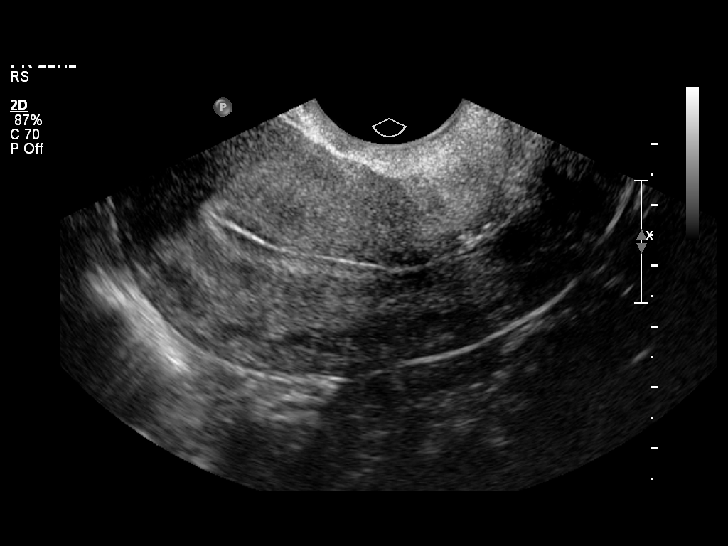

[14 of 25 positions shown; findings below may reference images not displayed]

FINDINGS: Uterus the uterus demonstrates a sagittal length of 8.4 cm, an AP
width of 4.1 cm and a transverse width of 5.2 cm.  A homogeneous
uterine myometrium is seen.

Endometrium a tri- layered endometrium is identified with an AP
width of 7.6 mm.  No areas of focal thickening or inhomogeneity are
seen.  This would suggest a periovulatory phase of the cycle.

Right Ovary the right ovary measures 3.7 x 2.5 x 3.0 cm and has a
normal appearance.

Left Ovary the left ovary measures 3.1 x 2.1 x 2.2 cm and has a
normal appearance.

Other Findings:  No pelvic fluid or separate adnexal masses are
noted.
IMPRESSION: Normal pelvic ultrasound
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
## 2010-09-16 NOTE — Miscellaneous (Signed)
Summary: chart update  Had some problems getting pt to wean off narcotics for back pain about 2 years ago. Would avoid these meds in this patient if possible.   Clinical Lists Changes  Medications: Removed medication of VICODIN 5-500 MG TABS (HYDROCODONE-ACETAMINOPHEN) 1-2 by mouth every 6 hours as needed for pain Removed medication of FLEXERIL 10 MG TABS (CYCLOBENZAPRINE HCL) 1 by mouth three times a day as needed Removed medication of NAPROXEN 500 MG TABS (NAPROXEN) 1 by mouth two times a day x 5 days

## 2010-09-16 NOTE — Assessment & Plan Note (Signed)
Summary: depression   Vital Signs:  Patient profile:   30 year old female Height:      63 inches Weight:      174.50 pounds BMI:     31.02 BSA:     1.83 Temp:     98.8 degrees F Pulse rate:   102 / minute BP sitting:   116 / 75  Vitals Entered By: Jone Baseman CMA (July 02, 2010 11:12 AM) CC: f/u Is Patient Diabetic? No Pain Assessment Patient in pain? no        Primary Care Provider:  Antoine Primas DO  CC:  f/u.  History of Present Illness: Pt states feelings of hoplesness, tearfulness, not enjoying things they used to, being more detached from family and friend, insomnia, trouble focusing, and overall feeling of fatigue.  Pt is feeling very overwhelmed with her husband out of work, her child with behavioral problems and family coming into town.  Pt states her mood is even effecting her at work.   Denies SI, HI   Still smoking, 1/2ppd     Habits & Providers  Alcohol-Tobacco-Diet     Tobacco Status: current     Tobacco Counseling: to quit use of tobacco products     Cigarette Packs/Day: 0.5  Exercise-Depression-Behavior     Depression Counseling: further diagnostic testing and/or other treatment is indicated  Current Medications (verified): 1)  Ibuprofen 800 Mg  Tabs (Ibuprofen) .Marland Kitchen.. 1 By Mouth Q8 Hrs As Needed Pain. 2)  Provera 10 Mg Tabs (Medroxyprogesterone Acetate) .... Take Once Daily For 10 Days Starting On Day 16 of Your Cycle 3)  Alprazolam 1 Mg Tabs (Alprazolam) .... Take 1 Tab By Mouth Three Times A Day Scheduled For The Next Week Then As Needed 4)  Effexor Xr 37.5 Mg Xr24h-Cap (Venlafaxine Hcl) .... Take 1 Tab By Mouth Daily For The Next Week Then 2 Tabs Daily  Allergies (verified): No Known Drug Allergies  Past History:  Past medical, surgical, family and social histories (including risk factors) reviewed, and no changes noted (except as noted below).  Past Medical History: Reviewed history from 10/14/2006 and no changes required. 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 Appendectomy - 08/17/1992, BTL 2006 - 08/31/2005  Family History: Reviewed history from 05/28/2009 and no changes required. bladder cancer - sister, Breast Ca - P. aunt,  Mom's brother - lung cancer father dead of prostate CA w/ bone mets, Migraines - sis,  prostate Ca - GF RA - mom  Social History: Reviewed history from 09/06/2008 and no changes required. 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  Separated/Divorced from first husband.  Lives w/ FOB #2 Gerilyn Pilgrim born 9/06).  Denies tob/drugs.  Occ [facility]  Review of Systems       denies fever, chills, nausea, vomiting, diarrhea or constipation   Physical Exam  General:  alert, well-developed, and well-nourished.  Tearful Eyes:  PERRL, EOMI Mouth:  MMM Lungs:  normal respiratory effort.   Heart:  normal rate.  no rhythm Abdomen:  soft and non-tender.   Pulses:  2+ Extremities:  no edema Psych:  very anxious mild flat affect, tearful   Impression & Recommendations:  Problem # 1:  DEPRESSION, MAJOR (ICD-296.20) Assessment New Pt appears for anxious and depressed, feels that side effects of SSRI may be worse,  will try effexor and see if helps.  Will titrate up after 1 week. Will give xanax for the time being but with hx of possible narcotic abuse will only use temporary. Will see again in 2-3 weeks. gave red flags of what to look for.  if effexor does not work would think mor eof wellbutrin due to maybe helping with smoking cessaastion Orders: [facility]- Est  Level 4 (52841)  Problem # 2:  TOBACCO USER (ICD-305.1) Assessment: Unchanged still not ready to quit.  Not a good time with all the stress.  Orders: [facility]- Est  Level 4 (32440)  Complete Medication List: 1)  Ibuprofen 800 Mg Tabs (Ibuprofen) .Marland Kitchen..  1 by mouth q8 hrs as needed pain. 2)  Provera 10 Mg Tabs (Medroxyprogesterone acetate) .... Take once daily for 10 days starting on day 16 of your cycle 3)  Alprazolam 1 Mg Tabs (Alprazolam) .... Take 1 tab by mouth three times a day scheduled for the next week then as needed 4)  Effexor Xr 37.5 Mg Xr24h-cap (Venlafaxine hcl) .... Take 1 tab by mouth daily for the next week then 2 tabs daily Prescriptions: ALPRAZOLAM 1 MG TABS (ALPRAZOLAM) take 1 tab by mouth three times a day scheduled for the next week then as needed  #90 x 0   Entered and Authorized by:   Antoine Primas DO   Signed by:   Antoine Primas DO on 07/02/2010   Method used:   Print then Give to Patient   RxID:   [id number] EFFEXOR XR 37.5 MG XR24H-CAP (VENLAFAXINE HCL) take 1 tab by mouth daily for the next week then 2 tabs daily  #62 x 1   Entered and Authorized by:   Antoine Primas DO   Signed by:   Antoine Primas DO on 07/02/2010   Method used:   Electronically to        [id number] ([facility])       [address] [place], [place]  [address]       Ph: [phone] or [phone]       Fax: [phone]   RxID:   [id number] ALPRAZOLAM 1 MG TABS (ALPRAZOLAM) take 1 tab by mouth three times a day scheduled for the next week then as needed  #90 x 0   Entered and Authorized by:   Antoine Primas DO   Signed by:   Antoine Primas DO on 07/02/2010   Method used:   Print then Give to Patient   RxID:   [id number]    Orders Added: 1)  [facility]- Est  Level 4 [71062]

## 2010-09-16 NOTE — Letter (Signed)
Summary: Work Engineer, production  All     ,     Phone:   Fax:     Today's Date: September 05, 2009  Name of Patient: [name]  The above named patient had a medical visit today at:  4 pm.  Please take this into consideration when reviewing the time away from work/school.    Special Instructions:  Needs to rest neck and arms for several days.  May try to return to work in 3-5 days.   Sincerely yours,      Tinnie Gens MD
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
## 2010-09-18 NOTE — Progress Notes (Signed)
  Medications Added VENLAFAXINE HCL 150 MG XR24H-TAB (VENLAFAXINE HCL) 1 tab by mouth daily       Phone Note Call from Patient   Caller: Patient Call For: [phone] Summary of Call: Patient is calling about her rx for Effexor.  Also a fax was sent to Dr. Katrinka Blazing from patient's insurance company  authorizing them to cover meds. Initial call taken by: Abundio Miu,  August 07, 2010 9:02 AM  Follow-up for Phone Call        rx done, increased to 150 xl and will see how pt will do and will see at the neginning of next year.  LM with husband.  Follow-up by: Antoine Primas DO,  August 07, 2010 9:21 AM    New/Updated Medications: VENLAFAXINE HCL 150 MG XR24H-TAB (VENLAFAXINE HCL) 1 tab by mouth daily Prescriptions: VENLAFAXINE HCL 150 MG XR24H-TAB (VENLAFAXINE HCL) 1 tab by mouth daily  #90 x 0   Entered and Authorized by:   Antoine Primas DO   Signed by:   Antoine Primas DO on 08/07/2010   Method used:   Handwritten   RxID:   [id number]

## 2010-09-18 NOTE — Miscellaneous (Signed)
Summary:  PA required    Clinical Lists Changes PA required for Venlafaxine ER because of quanity that has been pre scribed. called [employer] and they will fax form . case # [id number].  form placed in MD box.  Theresia Lo RN  July 30, 2010 11:56 AM Done to be faxed

## 2010-09-20 ENCOUNTER — Encounter: Payer: Self-pay | Admitting: *Deleted

## 2010-10-27 ENCOUNTER — Encounter: Payer: Self-pay | Admitting: Family Medicine

## 2010-10-28 ENCOUNTER — Ambulatory Visit (INDEPENDENT_AMBULATORY_CARE_PROVIDER_SITE_OTHER): Payer: 59 | Admitting: Family Medicine

## 2010-10-28 ENCOUNTER — Encounter: Payer: Self-pay | Admitting: Family Medicine

## 2010-10-28 DIAGNOSIS — F17200 Nicotine dependence, unspecified, uncomplicated: Secondary | ICD-10-CM

## 2010-10-28 DIAGNOSIS — F329 Major depressive disorder, single episode, unspecified: Secondary | ICD-10-CM

## 2010-10-28 DIAGNOSIS — M9909 Segmental and somatic dysfunction of abdomen and other regions: Secondary | ICD-10-CM | POA: Insufficient documentation

## 2010-10-28 DIAGNOSIS — M999 Biomechanical lesion, unspecified: Secondary | ICD-10-CM

## 2010-10-28 MED ORDER — VENLAFAXINE HCL ER 75 MG PO CP24: 225.0000 mg | ORAL_CAPSULE | Freq: Every day | ORAL | Status: DC

## 2010-10-28 NOTE — Assessment & Plan Note (Signed)
After verbal consent given pt did have manipulation done with HVLA and muscle energy to improve pain. Pt to f/u in 6 weeks if more is necessary given exercises to do.

## 2010-10-28 NOTE — Progress Notes (Signed)
  Subjective:    Patient ID: [name], female    DOB: 02-Sep-1980, 30 y.o.   MRN: [id number]  HPI 30 yo female here for well check. Pt is doing well physically no complaints.  Has had three normal paps last one in 2010 would like to be on the three year plan.   Depression.  Pt has been having some problem still with her depression/anxiety. Pt states she ran  Out of xanax and felt like she was going through withdrawal.  Pt is having more anxiety with her sone who is still having behavioral issues and still not getting a long with her husband.  Pt stated she has not been intimate with her husband for 5 months which bothers her, she still has the drive but he seems to push her away. Denies  Suicidal and Homicidal ideation    Smoking-  Still smoking not ready to quit yet, would look to it in the future and open to discuss it at next visit.   Pt also has had some manipulation in the past and is having a little back pain.   Review of Systems Denies fever, chills, nausea vomiting abdominal pain, dysuria, chest pain, shortness of breath dyspnea on exertion or numbness in extremities    Objective:   Physical Exam     General Appearance:    Alert, cooperative, no distress, appears stated age  Head:    Normocephalic, without obvious abnormality, atraumatic  Eyes:    PERRL, conjunctiva/corneas clear, EOM's intact, fundi    benign, both eyes  Ears:    Normal TM's and external ear canals, both ears  Nose:   Nares normal, septum midline, mucosa normal, no drainage    or sinus tenderness  Throat:   Lips, mucosa, and tongue normal; teeth and gums normal  Neck:   Supple, symmetrical, trachea midline, no adenopathy;    thyroid:  no enlargement/tenderness/nodules  Back:     Symmetric, no curvature, mild left lumbar muscle spasm  Lungs:     Clear to auscultation bilaterally, respirations unlabored  Chest Wall:    No tenderness or deformity   Heart:    Regular rate and rhythm, S1 and S2 normal, no  murmur, rub   or gallop     Abdomen:     Soft, non-tender, bowel sounds active all four quadrants,    no masses, no organomegaly        Extremities:   Extremities normal, atraumatic, no cyanosis or edema  Pulses:   2+ and symmetric all extremities  Skin:   Skin color, texture, turgor normal, no rashes or lesions     Neurologic:   CNII-XII intact, normal strength, sensation and reflexes    throughout    OMT Findings: Cervical:not examined Thoracic: T5 rotated and side bent right T7 rotated and side bent left Lumbar: L2 rotated and side bent left Sacrum: Posterior left sacrum with anterior left ilium .     Assessment & Plan:

## 2010-10-28 NOTE — Assessment & Plan Note (Signed)
Will increase Effexor to 225mg  daily, did just refill rx for xanax gave pt rx for ativan when refill comes to time to try to decrease dosing of benzo, do not fill until

## 2010-10-28 NOTE — Assessment & Plan Note (Signed)
Pt still not ready to quit yet at this time will readdress at follow up.

## 2010-10-28 NOTE — Patient Instructions (Signed)
Good to see you  We will increase to 225 of effexor Gave you prescription for Ativan to try instead jof effexor next time do not fill until the 7th of April I want to see you back in 6 weeks.

## 2010-11-17 ENCOUNTER — Telehealth: Payer: Self-pay | Admitting: Family Medicine

## 2010-11-17 NOTE — Telephone Encounter (Signed)
Called pt back states that the increase in meds is doing well but pt is wondering about her son and his psychiatry referral will send to support staff to check in on it.  Please call mom with any information once known.

## 2010-11-17 NOTE — Telephone Encounter (Signed)
Calling regarding her son Madelaine Bhat and herself.  Please call her back regarding info on psychiatrist for Adam.   Really need to speak with you on this matter and her meds.

## 2010-11-20 NOTE — Telephone Encounter (Signed)
Left message for Victorino Dike at [facility] to call me back regarding referral.

## 2010-11-25 NOTE — Telephone Encounter (Signed)
[facility] no longer has child psychiatrist available, called [facility] [phone]) and they are willing to see patient but patient has to call and make this appointment, left message on moms voicemail to call and set up appointment.

## 2010-12-09 ENCOUNTER — Encounter: Payer: Self-pay | Admitting: *Deleted

## 2011-01-02 NOTE — Op Note (Signed)
NAME:  [name], [name]                       ACCOUNT NO.:  [id number]   MEDICAL RECORD NO.:  [id number]                   PATIENT TYPE:  AMB   LOCATION:  SDC                                  [facility]ACILITY:  [facility]   PHYSICIAN:  Tanya S. Shawnie Pons, M.Reilly.                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   PROCEDURE:  Colposcopy with LEEP.   SURGEON:  Shelbie Proctor. Shawnie Pons, M.Reilly.   ANESTHESIA:  General anesthesia per Raul Del, M.Reilly.   SPECIMENS:  Cervix to pathology.   ESTIMATED BLOOD LOSS:  Less than 25 mL.   COMPLICATIONS:  None.   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  The patient also had a history of  marked anxiety disorder and is unable to have this procedure performed in  the office.  The patient agreed to have it done under sedation in the  operating room.   DESCRIPTION OF PROCEDURE:  The patient was taken to the OR where she was  placed in dorsal lithotomy and Allen stirrups after anesthesia was induced  and the patient was complete.  A Teflon coated speculum was placed inside  the vagina.  The cervix was visualized and liberally doused with acidic  acid.  Colposcopy was then performed.  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 There were some __________ as  well as some abnormal vessels there.  Colposcope was then removed from the  field and a LEEP was performed with a green loupe, taking off  ectocervix and  endocervix and two passes and one to get primarily anterior portion and one  to get primarily the posterior portion of the cervix.  The bed was then  coagulated with the electrocautery ball and good hemostasis was obtained.  The patient was then taken out of dorsal lithotomy position.  The speculum  was then removed from the patient's vagina.  The patient was taken out of  dorsal lithotomy position.  All instrument, needle, and lap counts were  correct x2.  The patient was awakened and taken to the recovery room in  stable condition.  Shelbie Proctor. Shawnie Pons, M.Reilly.   TSP/MEDQ  Reilly:  06/11/2003  T:  06/11/2003  Job:  [id number]

## 2011-01-02 NOTE — Group Therapy Note (Signed)
   NAME:  WHITLOW, Davy                         ACCOUNT NO.:  n/a   MEDICAL RECORD NO.:  [id number]                   PATIENT TYPE:   LOCATION:  [facility]                           FACILITY:   PHYSICIAN:  Tinnie Gens, MD                     DATE OF BIRTH:   DATE OF SERVICE:                                    CLINIC NOTE   CHIEF COMPLAINT:  Evaluate for LEEP.   SUBJECTIVE:  [name] is a 30 year old female who is referred to the GYN  Clinic from the [facility] for evaluation for a LEEP  procedure.  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  She was referred as the colposcopy did  not confirm the Pap smear results.  Of note, she does have a 74-year-old son.   She tells [place] that she has a phobia of procedures and had a very difficult  time tolerating her colposcopy and requests to be sedated for the LEEP  procedure.  She would like to go ahead with the LEEP as a history of cancer  runs strongly in her family and she would like to get it taken care of as  soon as possible.   OBJECTIVE:  VITAL SIGNS:  Noted.  GENERAL:  Well-developed, well-nourished, no apparent distress.   ASSESSMENT:  High grade squamous intraepithelial lesion on Pap smear not  confirmed by subsequent colposcopy.  Our staff will call the patient with an  appointment for a LEEP procedure under heavy sedation.  The procedure was  explained in detail to the patient.     Rosemarie Ax, MD                      Tinnie Gens, MD    NR/MEDQ  D:  05/17/2003  T:  05/18/2003  Job:  [id number]

## 2011-01-02 NOTE — Op Note (Signed)
NAME:  [name], [name]             ACCOUNT NO.:  [id number]   MEDICAL RECORD NO.:  [id number]          PATIENT TYPE:  INP   LOCATION:  9129                          [facility]ILITY:  [facility]   PHYSICIAN:  Tanya S. Shawnie Pons, M.D.   DATE OF BIRTH:  09/17/1980   DATE OF PROCEDURE:  05/08/2005  DATE OF DISCHARGE:                                 OPERATIVE REPORT   PREOPERATIVE DIAGNOSIS:  Multiparity, desires permanent sterilization.   POSTOPERATIVE DIAGNOSIS:  Multiparity, desires permanent sterilization.   PROCEDURE:  Postpartum tubal ligation using Filshie clips.   SURGEON:  Shelbie Proctor. Shawnie Pons, M.D.   ASSISTANT:  Marc Morgans. Mayford Knife, M.D.   ANESTHESIA:  General.   COMPLICATIONS:  None.   ESTIMATED BLOOD LOSS:  Minimal.   FLUIDS REPLACED:  750 mL Ringer's lactate.   URINE OUTPUT:  The patient voided immediately before the procedure.   FINDINGS:  Normal uterus, tubes, and ovaries.   DESCRIPTION OF PROCEDURE:  The patient was taken to the operating room where  general anesthesia was obtained without difficulty.  The patient was then  placed in the dorsal lithotomy position and prepped and draped in the usual  sterile fashion.  A small incision was made inferior to the umbilicus where  a previous scar was.  I was able to enter the intrauterine cavity using  blunt dissection.  A pack was used to help move the bowel aside.  The left  fallopian tube was grasped with a Babcock and then with another followed  down to the fimbria.  Next, in the midportion of the fallopian tube,  a  Filshie clip was used without difficulty.   Attention was turned to the right side where in a similar fashion, bowel had  to be pushed aside so that good visualization was obtained of the right  fallopian tube.  It was grasped with Babcock clamps, brought up to the  surface, grasped with a second Babcock clamp, and followed down to the  fimbria.  Next, a Filshie clip was placed in the midportion.  The packing  was removed.   Excellent hemostasis was obtained.  Next the fascia was closed  using 0  Vicryl in a running fashion.  Then 4-0 Vicryl was used to close the skin in  a subcuticular fashion.  There were no complications and the patient  tolerated the procedure well.  We also did inject 0.25% Marcaine into the  incision site prior to making the incision.     ______________________________  Marc Morgans Mayford Knife, M.D.    ______________________________  Shelbie Proctor. Shawnie Pons, M.D.    TLW/MEDQ  D:  05/08/2005  T:  05/08/2005  Job:  [id number]

## 2011-01-02 NOTE — H&P (Signed)
NAME:  [name], [name]             ACCOUNT NO.:  [id number]   MEDICAL RECORD NO.:  [id number]          PATIENT TYPE:  MAT   LOCATION:  MATC                          [facility]   PHYSICIAN:  Lesly Dukes, M.D. DATE OF BIRTH:  11-30-1980   DATE OF ADMISSION:  04/27/2005  DATE OF DISCHARGE:                                HISTORY & PHYSICAL   HISTORY OF PRESENT ILLNESS:  [name] is a 30 year old, G2, para 1-0-  0-1 female at 27 and 4 estimated week gestational age who presented for  abdominal pain.  Within several minutes of talking, she has changed her  story from having it for 2 days up to 9 months.  The pain is constant.  She  says she has had nausea and vomiting, but she has had vomiting her entire  pregnancy.  She denies diarrhea.  She also has a history of an appendectomy  in 1994.  She is not contracting, and her cervix is 1, long, and high.  The  patient has a varied exam, depending on when you examine her; sometimes it  hurts on the right side, sometimes it hurts on her ribs, sometimes it hurts  on her back, but everything hurts, but if you do distract her, I noted that  you could push harder without her wincing in pain.  She has no pain when I  jiggle the bed violently.  I explained to the patient that we would give her  pain medication after she took a urine drug screen.  I have strong suspicion  the patient is drug seeking.  When I explained this to her, the patient went  irate, jumped up off the bed, and said that she would not take a urine drug  screen, that she has great offense to this.  At this point, it was obvious  that her pain had somewhat miraculously resolved.  The patient then stormed  out.  Her husband then came back in to the emergency room.  We had a  somewhat civil conversation with [name], the R.N. present; however, as he  realized that we were not going to give her pain medication unless she  consented for a drug screen, then he became irate,  used multiple expletives.  Security was outside the room; however, the did not need to come in.  I  offered the patient to come back in and we could still follow through with  the ultrasound.  However, they said that they did not want to be seen here  anymore.  I explained if they changed their mind and would to come back in  and be examined, or receive pain medication after a urine drug screen that  could be run, then we would gladly do that.  Once again, the patient's  husbands used multiple expletives and left to go to the waiting room.           ______________________________  Lesly Dukes, M.D.     KHL/MEDQ  D:  04/27/2005  T:  04/27/2005  Job:  [id number]

## 2011-01-26 ENCOUNTER — Other Ambulatory Visit: Payer: Self-pay | Admitting: Family Medicine

## 2011-05-21 ENCOUNTER — Emergency Department (HOSPITAL_COMMUNITY): Payer: Self-pay

## 2011-05-21 ENCOUNTER — Emergency Department (HOSPITAL_COMMUNITY)
Admission: EM | Admit: 2011-05-21 | Discharge: 2011-05-22 | Disposition: A | Discharge status: Home / Self Care | Payer: Self-pay | Attending: Emergency Medicine | Admitting: Emergency Medicine

## 2011-05-21 DIAGNOSIS — R079 Chest pain, unspecified: Secondary | ICD-10-CM | POA: Insufficient documentation

## 2011-05-21 DIAGNOSIS — R509 Fever, unspecified: Secondary | ICD-10-CM | POA: Insufficient documentation

## 2011-05-21 DIAGNOSIS — R070 Pain in throat: Secondary | ICD-10-CM | POA: Insufficient documentation

## 2011-05-21 DIAGNOSIS — R05 Cough: Secondary | ICD-10-CM | POA: Insufficient documentation

## 2011-05-21 DIAGNOSIS — J3489 Other specified disorders of nose and nasal sinuses: Secondary | ICD-10-CM | POA: Insufficient documentation

## 2011-05-21 DIAGNOSIS — R5383 Other fatigue: Secondary | ICD-10-CM | POA: Insufficient documentation

## 2011-05-21 DIAGNOSIS — Z79899 Other long term (current) drug therapy: Secondary | ICD-10-CM | POA: Insufficient documentation

## 2011-05-21 DIAGNOSIS — R059 Cough, unspecified: Secondary | ICD-10-CM | POA: Insufficient documentation

## 2011-05-21 DIAGNOSIS — IMO0001 Reserved for inherently not codable concepts without codable children: Secondary | ICD-10-CM | POA: Insufficient documentation

## 2011-05-21 DIAGNOSIS — R5381 Other malaise: Secondary | ICD-10-CM | POA: Insufficient documentation

## 2011-05-21 DIAGNOSIS — B9789 Other viral agents as the cause of diseases classified elsewhere: Secondary | ICD-10-CM | POA: Insufficient documentation

## 2011-05-21 DIAGNOSIS — R42 Dizziness and giddiness: Secondary | ICD-10-CM | POA: Insufficient documentation

## 2011-05-21 DIAGNOSIS — R55 Syncope and collapse: Secondary | ICD-10-CM | POA: Insufficient documentation

## 2011-05-21 LAB — URINALYSIS, ROUTINE W REFLEX MICROSCOPIC
Bilirubin Urine: NEGATIVE
Ketones, ur: NEGATIVE mg/dL
Urobilinogen, UA: 0.2 mg/dL (ref 0.0–1.0)

## 2011-05-21 LAB — DIFFERENTIAL
Basophils Absolute: 0 10*3/uL (ref 0.0–0.1)
Basophils Relative: 0 % (ref 0–1)
Eosinophils Absolute: 0 10*3/uL (ref 0.0–0.7)
Eosinophils Relative: 0 % (ref 0–5)
Monocytes Absolute: 0.6 10*3/uL (ref 0.1–1.0)
Monocytes Relative: 6 % (ref 3–12)
Neutro Abs: 5.4 10*3/uL (ref 1.7–7.7)

## 2011-05-21 LAB — CBC
HCT: 43.8 % (ref 36.0–46.0)
Hemoglobin: 15.9 g/dL — ABNORMAL HIGH (ref 12.0–15.0)
MCH: 33.2 pg (ref 26.0–34.0)
MCV: 91.4 fL (ref 78.0–100.0)
RBC: 4.79 MIL/uL (ref 3.87–5.11)
WBC: 9.9 10*3/uL (ref 4.0–10.5)

## 2011-05-21 LAB — CK TOTAL AND CKMB (NOT AT ARMC)
Relative Index: INVALID (ref 0.0–2.5)
Total CK: 58 U/L (ref 7–177)

## 2011-05-21 LAB — COMPREHENSIVE METABOLIC PANEL
Albumin: 4.5 g/dL (ref 3.5–5.2)
BUN: 11 mg/dL (ref 6–23)
Calcium: 9.7 mg/dL (ref 8.4–10.5)
Chloride: 101 mEq/L (ref 96–112)
Creatinine, Ser: 0.8 mg/dL (ref 0.50–1.10)
Total Bilirubin: 0.3 mg/dL (ref 0.3–1.2)
Total Protein: 8.3 g/dL (ref 6.0–8.3)

## 2011-05-21 LAB — POCT I-STAT TROPONIN I: Troponin i, poc: 0 ng/mL (ref 0.00–0.08)

## 2011-05-21 LAB — MONONUCLEOSIS SCREEN: Mono Screen: NEGATIVE

## 2011-05-22 LAB — CBC
HCT: 45.4 % (ref 36.0–46.0)
MCV: 95.2 fL (ref 78.0–100.0)
Platelets: 224 10*3/uL (ref 150–400)
RDW: 12.4 % (ref 11.5–15.5)

## 2011-05-22 LAB — BASIC METABOLIC PANEL
BUN: 9 mg/dL (ref 6–23)
Creatinine, Ser: 0.71 mg/dL (ref 0.4–1.2)
GFR calc non Af Amer: >60 mL/min (ref >60)
Glucose, Bld: 85 mg/dL (ref 70–99)
Potassium: 4.7 mEq/L (ref 3.5–5.1)

## 2011-05-22 LAB — DIFFERENTIAL
Basophils Absolute: 0 10*3/uL (ref 0.0–0.1)
Eosinophils Absolute: 0.1 10*3/uL (ref 0.0–0.7)
Eosinophils Relative: 1 % (ref 0–5)
Lymphocytes Relative: 30 % (ref 12–46)

## 2012-04-06 ENCOUNTER — Encounter: Payer: Self-pay | Admitting: Family Medicine

## 2012-04-06 ENCOUNTER — Ambulatory Visit (INDEPENDENT_AMBULATORY_CARE_PROVIDER_SITE_OTHER): Payer: Self-pay | Admitting: Family Medicine

## 2012-04-06 VITALS — BP 120/79 | HR 106 | Temp 98.3°F | Ht 63.0 in | Wt 187.0 lb

## 2012-04-06 DIAGNOSIS — B0089 Other herpesviral infection: Secondary | ICD-10-CM

## 2012-04-06 MED ORDER — CETIRIZINE HCL 10 MG PO TABS: 10.0000 mg | ORAL_TABLET | Freq: Every day | ORAL | Status: DC

## 2012-04-06 MED ORDER — ACYCLOVIR 200 MG PO CAPS: 400.0000 mg | ORAL_CAPSULE | Freq: Every day | ORAL | Status: AC

## 2012-04-06 NOTE — Patient Instructions (Signed)
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 We are going to treat you as we discussed and see you back in 1 month.   Herpetic Whitlow Herpetic whitlow is a painful infection of the hand. It can involve 1 or more fingers. It usually affects the end of the finger. This is caused by the Herpes simplex virus 1 (HSV-1) and herpes simplex virus 2 (HSV-2). It is an occupational risk among health care workers.  Herpetic whitlow is characterized by a starting infection, which may be followed by a problem free period but with future recurrences. After the initial infection, the virus enters nerve endings and lies dormant in those nerves. The primary infection usually is the most troublesome. Recurrences observed in 20-50% of cases are usually milder and shorter in duration. Once nerves are infected with herpes virus they are thought to contain that virus for the rest of your life. CAUSES  Males and females are affected equally by herpetic whitlow. In health care workers, infection with HSV-1 is most common. It comes from exposure to infected secretions from the mouths of patients. Herpetic whitlow is started by exposure to infected body fluids. The virus gets in through a break in the skin. This could be any small thing such as a torn cuticle. The virus then invades the skin cells. Signs of infection show in days. In children, HSV-1 is the most likely cause. Infection involving the finger usually is due to finger-sucking or thumb-sucking in patients with herpes infection. Toddlers and preschool children are most likely to engage in thumb-sucking or finger-sucking behavior. They are susceptible to herpetic whitlow if they have herpes infection of the mouth. SYMPTOMS   Following exposure, problems usually develop within 2-20 days (incubation period). Sometimes fever and sleepiness are observed. Most often initial symptoms are pain and burning or  tingling of the infected digit.   This usually is followed by redness, swelling. There will be development of rice sized vesicles on a red base over the next 7-10 days.   These vesicles may ulcerate or break. They usually contain clear fluid. But the fluid may appear cloudy or bloody. Inflammation of the lymph channels which return the body fluids to the heart and lymphnodes (swollen glands) are common. After 10-14 days, symptoms usually improve. The sores (lesions) crust over and heal.   The infectious phase is believed to be over at this point. Complete resolution happens over the next 5-7 days.   Problems from this infection are usually related to secondary infections. Complications may include delayed resolution, bacterial overgrowth. These rarely spread throughout the body with serious consequences.  DIAGNOSIS  The diagnosis is usually easily made on physical exam. Sometimes lab work is needed. HOME CARE INSTRUCTIONS   Only take over-the-counter or prescription medicines for pain, discomfort, or fever as directed by your caregiver. Do notuse aspirin.   Do not touch the blisters or pick the scabs. Wash your hands often. Do not touch your eyes, mouth or genital areas without washing your hands first. Do not share towels and wash cloths.   Apply an ice pack to the sore area for discomfort.   This infection is contagious. Avoid close contact with other people until blisters heal. This can be transferred to both the mouth and the genital area.   Eat a well balanced diet.   This problem can be prevented by use of gloves. Observe fluid precautions if you are handling people.  In the general adult population, herpetic whitlow is most often from yourself. It is most frequently secondary to infection with HSV-2.  MAKE SURE YOU:   Understand these instructions.   Will watch your condition.   Will get help right away if you are not doing well or get worse.  Document Released: 10/24/2002  Document Revised: 07/23/2011 Document Reviewed: 03/22/2008 [facility] Patient Information 2012 [place], [place].

## 2012-04-07 NOTE — Progress Notes (Signed)
Subjective:   1. Concern for spider bite and hives-patient states that 4 weeks ago she thought she was bit on her right first finger by a sider (never saw spider but saw a central area that looked like puncture. It tunrned into a brown circle . It got swollen 1.5 days ago and drained some clear fluid. Now states that over last 2 weeks she has noted spreading to other fingers. She feels some swellling in her hands and lips intermittently when not taking benadryl and areas on her fingers are painful.   During this time, she has been having hives all over her body with exception of not on her legs. Lips started swelling 1.5 weeks ago but no tongue swelling or SOB. Has been taking BEnadryl BID. Associated with small bumps on her lips. Denies new shampoo, soap, sheets, or other contacts.    Denies fever, chills, nausea, vomiting.   ROS--See HPI  Past Medical History-smoking status noted: current smoker.  Reviewed problem list.  Medications- reviewed and updated Chief complaint-noted  Objective: BP 120/79  Pulse 106  Temp 98.3 F (36.8 C) (Oral)  Ht 5\' 3"  (1.6 m)  Wt 187 lb (84.823 kg)  BMI 33.13 kg/m2 Gen: NAD, anxious appearing when discussing diagnosis CV: RRR no mrg Lungs: CTAB HEENT: small vesicles noted on left upper lip and on inside of mouth on the left side MSK: no edema.  Skin: no hives noted, erythema and crusting/scaling noted on right 1st finger but also in smaller patches on other fingers. Multiple small circular lesions on both hands with central area of indentation. No dermatographism.   Assessment/Plan: See problem oriented charted

## 2012-04-07 NOTE — Assessment & Plan Note (Signed)
Treat with acyclovir due to cost of valtrex. Will have patient follow up in 1 month. Consider HIV testing due to diffuse appearance and no improvement after 4 weeks and usually self limited after 2-3 weeks. For hives, will start patient on Zyrtec. Hopefully, will no longer need benadryl once current infection calmed down. Patient knows to report immediately if any tongue swelling or shortness of breath.   Will consider chronic therapy for at least 3 months at follow up visit in 1 month.   Provided counseling for >50% of 30 minute office visit. Patient very anxious about diagnosis. Explained could recur even after our treatment.

## 2012-10-27 ENCOUNTER — Encounter: Payer: Self-pay | Admitting: Family Medicine

## 2012-10-27 ENCOUNTER — Telehealth: Payer: Self-pay | Admitting: Family Medicine

## 2012-10-27 ENCOUNTER — Ambulatory Visit
Admission: RE | Admit: 2012-10-27 | Discharge: 2012-10-27 | Disposition: A | Discharge status: Home / Self Care | Payer: BC Managed Care – PPO | Source: Ambulatory Visit | Attending: Family Medicine | Admitting: Family Medicine

## 2012-10-27 ENCOUNTER — Ambulatory Visit (INDEPENDENT_AMBULATORY_CARE_PROVIDER_SITE_OTHER): Payer: BC Managed Care – PPO | Admitting: Family Medicine

## 2012-10-27 VITALS — BP 115/80 | HR 98 | Temp 98.8°F

## 2012-10-27 DIAGNOSIS — M25571 Pain in right ankle and joints of right foot: Secondary | ICD-10-CM

## 2012-10-27 DIAGNOSIS — M25579 Pain in unspecified ankle and joints of unspecified foot: Secondary | ICD-10-CM

## 2012-10-27 MED ORDER — HYDROCODONE-ACETAMINOPHEN 5-325 MG PO TABS: 1.0000 | ORAL_TABLET | Freq: Four times a day (QID) | ORAL | Status: DC | PRN

## 2012-10-27 NOTE — Telephone Encounter (Signed)
Patient is calling requesting the results of her xray.  She is asking for Dr. Louanne Belton to call her before the end of the day please.

## 2012-10-27 NOTE — Patient Instructions (Signed)
Go get an x-ray.  We will be in touch with you about the results later this afternoon. I have given you a prescription for pain medication.

## 2012-10-27 NOTE — Telephone Encounter (Signed)
Please let the patient know the x-ray does not show a fracture.  She should wear the post-op shoe for 2 weeks and come in for an appointment.  We will re-examine her at that time and decide if it is OK for her to take the shoe off.

## 2012-10-27 NOTE — Progress Notes (Signed)
Patient ID: [name], female   DOB: 05-Dec-1980, 32 y.o.   MRN: [id number] Subjective: The patient is a 32 y.o. year old female who presents today for right foot pain. The patient had a heavy stool fall on her right forefoot at approximately 9 AM this morning. She had immediate pain. She is having a hard time. Weight. She is not taking any medication for this.  Patient's past medical, social, and family history were reviewed and updated as appropriate. History  Substance Use Topics  . Smoking status: Current Every Day Smoker -- 0.50 packs/day    Types: Cigarettes  . Smokeless tobacco: Never Used  . Alcohol Use: Not on file   Objective:  Filed Vitals:   10/27/12 1119  BP: 115/80  Pulse: 98  Temp: 98.8 F (37.1 C)   Gen: No acute distress Extremities: There is mild swelling and bruising of the right forefoot. There is significant pain to palpation along the metatarsals. Patient is able to move all toes, although with some pain. Pulses are preserved. There is no disturbance of the ankle motion.  Assessment/Plan: Right forefoot injury: We will need to exclude fracture. I will obtain a complete x-ray of the right foot. Patient with postop shoe and crutches. Will provide her with narcotics for pain  Please also see individual problems in problem list for problem-specific plans.

## 2012-10-27 NOTE — Telephone Encounter (Signed)
Pt called back and message was given - an appt was also made for 3/25

## 2012-10-27 NOTE — Telephone Encounter (Signed)
Attempted to call patient no answer

## 2012-11-08 ENCOUNTER — Ambulatory Visit: Payer: BC Managed Care – PPO | Admitting: Family Medicine

## 2012-11-12 ENCOUNTER — Encounter (HOSPITAL_COMMUNITY): Payer: Self-pay | Admitting: *Deleted

## 2012-11-12 ENCOUNTER — Inpatient Hospital Stay (HOSPITAL_COMMUNITY)
Admission: AD | Admit: 2012-11-12 | Discharge: 2012-11-17 | DRG: 430 | Disposition: A | Discharge status: Home / Self Care | Payer: BC Managed Care – PPO | Attending: Psychiatry | Admitting: Psychiatry

## 2012-11-12 DIAGNOSIS — F411 Generalized anxiety disorder: Secondary | ICD-10-CM | POA: Diagnosis present

## 2012-11-12 DIAGNOSIS — M419 Scoliosis, unspecified: Secondary | ICD-10-CM

## 2012-11-12 DIAGNOSIS — B0089 Other herpesviral infection: Secondary | ICD-10-CM

## 2012-11-12 DIAGNOSIS — F1210 Cannabis abuse, uncomplicated: Secondary | ICD-10-CM | POA: Diagnosis present

## 2012-11-12 DIAGNOSIS — F4329 Adjustment disorder with other symptoms: Secondary | ICD-10-CM

## 2012-11-12 DIAGNOSIS — F332 Major depressive disorder, recurrent severe without psychotic features: Principal | ICD-10-CM | POA: Diagnosis present

## 2012-11-12 DIAGNOSIS — F17200 Nicotine dependence, unspecified, uncomplicated: Secondary | ICD-10-CM

## 2012-11-12 DIAGNOSIS — F322 Major depressive disorder, single episode, severe without psychotic features: Secondary | ICD-10-CM | POA: Diagnosis present

## 2012-11-12 DIAGNOSIS — N938 Other specified abnormal uterine and vaginal bleeding: Secondary | ICD-10-CM

## 2012-11-12 DIAGNOSIS — F4321 Adjustment disorder with depressed mood: Secondary | ICD-10-CM | POA: Diagnosis present

## 2012-11-12 DIAGNOSIS — Z79899 Other long term (current) drug therapy: Secondary | ICD-10-CM

## 2012-11-12 DIAGNOSIS — F32A Depression, unspecified: Secondary | ICD-10-CM

## 2012-11-12 DIAGNOSIS — F329 Major depressive disorder, single episode, unspecified: Secondary | ICD-10-CM

## 2012-11-12 DIAGNOSIS — N949 Unspecified condition associated with female genital organs and menstrual cycle: Secondary | ICD-10-CM

## 2012-11-12 DIAGNOSIS — F1010 Alcohol abuse, uncomplicated: Secondary | ICD-10-CM | POA: Diagnosis present

## 2012-11-12 HISTORY — DX: Scoliosis, unspecified: M41.9

## 2012-11-12 LAB — COMPREHENSIVE METABOLIC PANEL
AST: 17 U/L (ref 0–37)
Albumin: 4.2 g/dL (ref 3.5–5.2)
Alkaline Phosphatase: 61 U/L (ref 39–117)
Chloride: 101 mEq/L (ref 96–112)
Potassium: 3.6 mEq/L (ref 3.5–5.1)
Sodium: 135 mEq/L (ref 135–145)
Total Bilirubin: 0.5 mg/dL (ref 0.3–1.2)

## 2012-11-12 LAB — CBC
Platelets: 217 10*3/uL (ref 150–400)
RDW: 12 % (ref 11.5–15.5)
WBC: 13.6 10*3/uL — ABNORMAL HIGH (ref 4.0–10.5)

## 2012-11-12 LAB — HEPATIC FUNCTION PANEL
ALT: 25 U/L (ref 0–35)
Alkaline Phosphatase: 61 U/L (ref 39–117)
Bilirubin, Direct: <0.1 mg/dL (ref 0.0–0.3)
Total Protein: 7.9 g/dL (ref 6.0–8.3)

## 2012-11-12 MED ORDER — CHLORDIAZEPOXIDE HCL 25 MG PO CAPS
25.0000 mg | ORAL_CAPSULE | Freq: Four times a day (QID) | ORAL | Status: AC | PRN
  Administered 2012-11-12 – 2012-11-15 (×10): 25 mg via ORAL
  Filled 2012-11-12 (×11): qty 1

## 2012-11-12 MED ORDER — NAPROXEN 500 MG PO TABS
500.0000 mg | ORAL_TABLET | Freq: Two times a day (BID) | ORAL | Status: DC | PRN
  Administered 2012-11-12: 500 mg via ORAL
  Filled 2012-11-12 (×2): qty 1

## 2012-11-12 MED ORDER — ALUM & MAG HYDROXIDE-SIMETH 200-200-20 MG/5ML PO SUSP: 30.0000 mL | ORAL | Status: DC | PRN

## 2012-11-12 MED ORDER — HYDROXYZINE HCL 25 MG PO TABS
25.0000 mg | ORAL_TABLET | Freq: Four times a day (QID) | ORAL | Status: DC | PRN
  Administered 2012-11-12 – 2012-11-15 (×10): 25 mg via ORAL

## 2012-11-12 MED ORDER — MAGNESIUM HYDROXIDE 400 MG/5ML PO SUSP
30.0000 mL | Freq: Every day | ORAL | Status: DC | PRN
  Administered 2012-11-13: 30 mL via ORAL

## 2012-11-12 MED ORDER — ACETAMINOPHEN 325 MG PO TABS: 650.0000 mg | ORAL_TABLET | Freq: Four times a day (QID) | ORAL | Status: DC | PRN

## 2012-11-12 NOTE — Progress Notes (Signed)
Met with pt 1:1 who has been visible in the dayroom this evening. She is flat and depressed both in affect and mood. Attended group and interacts appropriately. When asked about SI pt replies, "well, I haven't had any tonight and that's an improvement." Pt supported and encouraged. Medicated with librium and vistaril per her request prior to sleep and on reassess, relief was achieved. No HI/AVH. Will continue to monitor closely. Lawrence Marseilles
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
## 2012-11-12 NOTE — Progress Notes (Signed)
Psychoeducational Group Note  Date: 11/12/2012 Time:  1015  Group Topic/Focus:  Identifying Needs:   The focus of this group is to help patients identify their personal needs that have been historically problematic and identify healthy behaviors to address their needs.  Participation Level:  Active  Participation Quality:  Appropriate  Affect:  Appropriate  Cognitive:  Appropriate  Insight:  Improving  Engagement in Group:  Engaged  Additional Comments:  Paid close attention to all that was being said and participated and gave input.   Chloeanne Poteet A 

## 2012-11-12 NOTE — Clinical Social Work Note (Signed)
[facility] Group Notes: (Clinical Social Work)   11/12/2012      Type of Therapy:  Group Therapy   Participation Level:  Did Not Attend    Ambrose Mantle, LCSW 11/12/2012, 5:08 PM

## 2012-11-12 NOTE — Progress Notes (Signed)
32 year old female admitted as a walk-in and as a voluntary admission. Pt reports that "I feel like I'm losing my mind." , and "I've lost everybody in my family". Pt reports having lost her mother about a year ago and 2 of her sisters earlier this year and states she is having difficulties with her 70 year old son. Pt does endorse depression but denies any SI but does state that if she wasn't here she would be ok with it. Pt able to contract for safety on the unit. Pt is married and lives with her husband and her 2 boys and will return there upon discharge. Pt reports that she uses xanax on a regular basis that is prescribed from her psychiatrist. Pt was oriented to the unit and safety maintained.

## 2012-11-12 NOTE — Tx Team (Signed)
Initial Interdisciplinary Treatment Plan  PATIENT STRENGTHS: (choose at least two) Ability for insight Average or above average intelligence Capable of independent living  PATIENT STRESSORS: Loss of family members* Traumatic event   PROBLEM LIST: Problem List/Patient Goals Date to be addressed Date deferred Reason deferred Estimated date of resolution  Depression 11/12/12                                                      DISCHARGE CRITERIA:  Ability to meet basic life and health needs Improved stabilization in mood, thinking, and/or behavior Verbal commitment to aftercare and medication compliance  PRELIMINARY DISCHARGE PLAN: Outpatient therapy Participate in family therapy Return to previous living arrangement  PATIENT/FAMIILY INVOLVEMENT: This treatment plan has been presented to and reviewed with the patient, [name], and/or family member, .  The patient and family have been given the opportunity to ask questions and make suggestions.  Donella Pascarella, [place] 11/12/2012, 3:49 PM

## 2012-11-13 DIAGNOSIS — F329 Major depressive disorder, single episode, unspecified: Secondary | ICD-10-CM | POA: Insufficient documentation

## 2012-11-13 DIAGNOSIS — F1010 Alcohol abuse, uncomplicated: Secondary | ICD-10-CM

## 2012-11-13 DIAGNOSIS — F1210 Cannabis abuse, uncomplicated: Secondary | ICD-10-CM

## 2012-11-13 DIAGNOSIS — F32A Depression, unspecified: Secondary | ICD-10-CM | POA: Insufficient documentation

## 2012-11-13 DIAGNOSIS — F1994 Other psychoactive substance use, unspecified with psychoactive substance-induced mood disorder: Secondary | ICD-10-CM

## 2012-11-13 LAB — URINALYSIS, ROUTINE W REFLEX MICROSCOPIC
Bilirubin Urine: NEGATIVE
Ketones, ur: NEGATIVE mg/dL
Nitrite: NEGATIVE
pH: 6 (ref 5.0–8.0)

## 2012-11-13 LAB — RAPID URINE DRUG SCREEN, HOSP PERFORMED
Amphetamines: NOT DETECTED
Benzodiazepines: POSITIVE — AB
Opiates: NOT DETECTED
Tetrahydrocannabinol: POSITIVE — AB

## 2012-11-13 MED ORDER — GABAPENTIN 100 MG PO CAPS
100.0000 mg | ORAL_CAPSULE | Freq: Three times a day (TID) | ORAL | Status: DC
  Administered 2012-11-13 – 2012-11-15 (×7): 100 mg via ORAL
  Filled 2012-11-13 (×15): qty 1

## 2012-11-13 MED ORDER — CITALOPRAM HYDROBROMIDE 20 MG PO TABS
20.0000 mg | ORAL_TABLET | Freq: Every day | ORAL | Status: DC
  Administered 2012-11-13 – 2012-11-16 (×4): 20 mg via ORAL
  Filled 2012-11-13 (×6): qty 1

## 2012-11-13 MED ORDER — NICOTINE 21 MG/24HR TD PT24
21.0000 mg | MEDICATED_PATCH | Freq: Every day | TRANSDERMAL | Status: DC
  Administered 2012-11-14 – 2012-11-17 (×4): 21 mg via TRANSDERMAL
  Filled 2012-11-13 (×6): qty 1

## 2012-11-13 NOTE — Progress Notes (Signed)
Pt as nurses' station, tearful, complaining of disturbing nightmares. "I've had them over the last few months but this was really bad." "I also feel like I'm going to jump out of my skin." "I don't have my xanax anymore." Pt supported, educated about removing nicotine patch prior to sleep. Medicated with librium and vistaril. Will reassess within 1 hr. Lawrence [place]se[place]

## 2012-11-13 NOTE — Progress Notes (Signed)
Psychoeducational Group Note  Date:  11/13/2012 Time:  1015  Group Topic/Focus:  Making Healthy Choices:   The focus of this group is to help patients identify negative/unhealthy choices they were using prior to admission and identify positive/healthier coping strategies to replace them upon discharge.  Participation Level:  Active  Participation Quality:  Appropriate  Affect:  Appropriate  Cognitive:  Appropriate  Insight:  Engaged  Engagement in Group:  Improving  Additional Comments:    Chalet Kerwin A 11/13/2012 
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
## 2012-11-13 NOTE — Progress Notes (Signed)
[facility] Group Notes:  (Nursing/MHT/Case Management/Adjunct)  Date:  11/12/2012 Time:  2000  Type of Therapy:  Psychoeducational Skills  Participation Level:  Active  Participation Quality:  Attentive  Affect:  Depressed  Cognitive:  Appropriate  Insight:  Good  Engagement in Group:  Engaged  Modes of Intervention:  Education  Summary of Progress/Problems: The patient initially became tearful in group as she listened to what her peers had to say. She does admit to being overwhelmed and depressed. She also admits to being afraid and nervous.   Rayshaun Needle S 11/13/2012, 12:12 AM

## 2012-11-13 NOTE — Clinical Social Work Note (Signed)
[facility] Group Notes:  (Clinical Social Work)  11/13/2012   3:00-4:00PM  Summary of Progress/Problems:    The main focus of today's process group was to define "support" and describe what healthy supports are.  We then discussed how and why to increase patient supports, using motivational interviewing.  An emphasis was placed on using counselor, doctor, therapy groups, self-help groups and problem-specific support groups to expand supports.   The patient expressed understanding.  Type of Therapy:  Process Group  Participation Level:  Active  Participation Quality:  Appropriate, Attentive, Sharing and Supportive  Affect:  Appropriate  Cognitive:  Appropriate  Insight:  Engaged  Engagement in Therapy:  Engaged  Modes of Intervention:  Education,  Teacher, English as a foreign language, Exploration, Discussion   Ambrose Mantle, LCSW 11/13/2012, 4:45 PM

## 2012-11-13 NOTE — Progress Notes (Signed)
D Valisha has had a difficult time today...handling her feelings of extreme despair and sadness and pain due to the losses of 4 loved ones recently. She is engaged in her recovery. She is accepting of her diagnosis, she is trying to learn and understand her illness, yet she admits she feels utterly and totally alone.     A She has attended her groups as scheduled. SHe completed her AM self inventory and on it she wrote that she cont to have " off and on" SI within the past 24 hrs, but she contracts for safety. She rated her depression and hopelessness " 8 / 6 " and said her DC plan is to " hopelfully not to cry as much".     R Safety is in place and POC icnludes starting her on celexa today . Therapeutic relationhip fostered.
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
## 2012-11-13 NOTE — Progress Notes (Signed)
Psychoeducational Group Note  Psychoeducational Group Note  Date: 11/13/2012 Time:  11/13/2012  Group Topic/Focus:  Gratefulness:  The focus of this group is to help patients identify what two things they are most grateful for in their lives. What helps ground them and to center them on their work to their recovery.  Participation Level:  Active  Participation Quality:  Appropriate  Affect:  Appropriate  Cognitive:  Appropriate  Insight:  Improving  Engagement in Group:  Engaged  Additional Comments:    Dione Housekeeper

## 2012-11-13 NOTE — Progress Notes (Signed)
Spoke with pt 1:1 who remains flat and depressed. She does state she's had a better day today and is starting to process her emotions. Plans to follow up with Hospice for grief counseling. Pt had a good visit with husband though was tearful when they parted. States she knows she needs to be here even though she misses him. Interacts appropriately. Attends group. Medicated per orders. Support offered. Pt benefiting from librium and vistaril for anxiety. She denies SI/HI/AVh at this time and remains safe. Lawrence Marseilles

## 2012-11-14 ENCOUNTER — Encounter (HOSPITAL_COMMUNITY): Payer: Self-pay | Admitting: Registered Nurse

## 2012-11-14 DIAGNOSIS — F329 Major depressive disorder, single episode, unspecified: Secondary | ICD-10-CM

## 2012-11-14 NOTE — Tx Team (Signed)
Interdisciplinary Treatment Plan Update   Date Reviewed:  11/14/2012  Time Reviewed:  9:41 AM  Progress in Treatment:   Attending groups: Yes Participating in groups: Yes Taking medication as prescribed: Yes  Tolerating medication: Yes Family/Significant other contact made: No contact made at this time. Patient understands diagnosis: Yes  Discussing patient identified problems/goals with staff: Yes Medical problems stabilized or resolved: Yes Denies suicidal/homicidal ideation: Yes Patient has not harmed self or others: Yes  For review of initial/current patient goals, please see plan of care.  Estimated Length of Stay:  2-3 days  Reasons for Continued Hospitalization:  Anxiety Depression Medication stabilization  New Problems/Goals identified:    Discharge Plan or Barriers:   Home with outpatient follow up with Dr. Evelene Croon  Additional Comments:  Patient reports admitting to hospital with SI.  She advised of the deaths of her two sisters one in 09-08-22 and the other in February of this years. She shared mother died 09-Sep-2011. She currently denies SI/HI and rates depression at six/seven and anxiety at eight.    Attendees:  Patient:  11/14/2012 9:41 AM   Signature: 11/14/2012 9:41 AM  Signature: 11/14/2012 9:41 AM  Signature:  11/14/2012 9:41 AM  Signature: Liborio Nixon, RN 11/14/2012 9:41 AM  Signature:  Neill Loft RN 11/14/2012 9:41 AM  Signature:  Juline Patch, LCSW 11/14/2012 9:41 AM  Signature: Silverio Decamp, PMH-NP 11/14/2012 9:41 AM  Signature: Liliane Bade, BSW 11/14/2012 9:41 AM  Signature:  11/14/2012 9:41 AM  Signature:    Signature:    Signature:      Scribe for Treatment Team:   Juline Patch,  11/14/2012 9:41 AM

## 2012-11-14 NOTE — Progress Notes (Signed)
[facility] MD Progress Note  11/14/2012 12:59 PM [name]  MRN:  [id number] Subjective:  Patient states "my mom died and I had 2 sisters to die this year and I was having feelings that I wanted to be with my family.  I have learned a lot of things in the 24 hours that I have been here.  Like how to deal with my 34 yr old son who thinks I am an allen and I have to learn to live with out them." "Nobody told me that I was to take off the nicotine patch so I slept in it and had night terrors one of the patients told me I was suppose to take it off. When I was talking to one of the nurses about the night terrors she asked me if I took off the patch; and I told her no because I was told that it was good for 24 hours"  Diagnosis:  Axis I: Depression, Major, Alcohol Abuse, Cannabis abus  ADL's:  Intact  Sleep: Fair  Appetite:  Fair Improving "I ate today" Suicidal Ideation: Yes Plan:  No "I did not have a plan to kill my self.  I don't feel that way anymore; I want to live I just want to live happy" Homicidal Ideation:  Denies AEB (as evidenced by):  Patient is participating in group sessions. "I have found my self to be a listener and people have came to me individually and I just let them talk.  I am learning to love myself again; basically it is out of my control that people die; I just have to learn to cope and live life through them and for them."  "I was just really low and needed hope"  Psychiatric Specialty Exam: Review of Systems  Gastrointestinal:       Patient states that when ever she has been drinking liquids feel like she is choking "like going down wrong way.  It is not happen every time but it is increasing.  I noticed it over the last few month and now it is increasing.  Discussed with patient needing to see GI specialist or having her PCP refer.  Also instructed that if situation worsened to inform staff   Psychiatric/Behavioral: Positive for depression (Rates 6/10.  Family  deaths is the biggest stressor and "my son was added into that".), suicidal ideas and substance abuse. Negative for hallucinations and memory loss. The patient is nervous/anxious (Rates 7/10 "but not all the time; I can tell when it is time for me to take some anxiety medicine."). The patient does not have insomnia.   All other systems reviewed and are negative.    Blood pressure 123/83, pulse 90, temperature 98.1 F (36.7 C), temperature source Oral, resp. rate 18, height 5\' 3"  (1.6 m), weight 82.555 kg (182 lb).Body mass index is 32.25 kg/(m^2).  General Appearance: Casual and Fairly Groomed  Patent attorney::  Fair  Speech:  Clear and Coherent and Normal Rate  Volume:  Normal  Mood:  Anxious, Depressed, Hopeless and Worthless  Affect:  Depressed and Flat  Thought Process:  Circumstantial, Goal Directed and Logical  Orientation:  Full (Time, Place, and Person)  Thought Content:  WDL  Suicidal Thoughts:  Yes.  with intent/plan  Homicidal Thoughts:  No  Memory:  Immediate;   Good Recent;   Good Remote;   Good  Judgement:  Fair  Insight:  Fair  Psychomotor Activity:  Normal  Concentration:  Fair  Recall:  Fair  Akathisia:  No  Handed:  Right  AIMS (if indicated):     Assets:  Communication Skills Desire for Improvement Housing Physical Health Social Support Transportation  Sleep:  Number of Hours: 6   Current Medications: Current Facility-Administered Medications  Medication Dose Route Frequency Provider Last Rate Last Dose  . acetaminophen (TYLENOL) tablet 650 mg  650 mg Oral Q6H PRN Davey Bergsma, NP      . alum & mag hydroxide-simeth (MAALOX/MYLANTA) 200-200-20 MG/5ML suspension 30 mL  30 mL Oral Q4H PRN Jaeson Molstad, NP      . chlordiazePOXIDE (LIBRIUM) capsule 25 mg  25 mg Oral Q6H PRN Asencion Guisinger, NP   25 mg at 11/14/12 0939  . citalopram (CELEXA) tablet 20 mg  20 mg Oral Daily Verne Spurr, PA-C   20 mg at 11/14/12 0801  . gabapentin (NEURONTIN) capsule 100 mg  100 mg  Oral TID Mike Craze, MD   100 mg at 11/14/12 0801  . hydrOXYzine (ATARAX/VISTARIL) tablet 25 mg  25 mg Oral Q6H PRN Alegria Dominique, NP   25 mg at 11/14/12 0939  . magnesium hydroxide (MILK OF MAGNESIA) suspension 30 mL  30 mL Oral Daily PRN Madalyne Husk, NP   30 mL at 11/13/12 2251  . naproxen (NAPROSYN) tablet 500 mg  500 mg Oral BID PRN Wanona Stare, NP   500 mg at 11/12/12 1609  . nicotine (NICODERM CQ - dosed in mg/24 hours) patch 21 mg  21 mg Transdermal Q0600 Himabindu Ravi, MD   21 mg at 11/14/12 0654    Lab Results:  Results for orders placed during the hospital encounter of 11/12/12 (from the past 48 hour(s))  CBC     Status: Abnormal   Collection Time    11/12/12  7:50 PM      Result Value Range   WBC 13.6 (*) 4.0 - 10.5 K/uL   RBC 4.51  3.87 - 5.11 MIL/uL   Hemoglobin 14.5  12.0 - 15.0 g/dL   HCT 16.1  09.6 - 04.5 %   MCV 91.6  78.0 - 100.0 fL   MCH 32.2  26.0 - 34.0 pg   MCHC 35.1  30.0 - 36.0 g/dL   RDW 40.9  81.1 - 91.4 %   Platelets 217  150 - 400 K/uL  COMPREHENSIVE METABOLIC PANEL     Status: Abnormal   Collection Time    11/12/12  7:50 PM      Result Value Range   Sodium 135  135 - 145 mEq/L   Potassium 3.6  3.5 - 5.1 mEq/L   Chloride 101  96 - 112 mEq/L   CO2 26  19 - 32 mEq/L   Glucose, Bld 102 (*) 70 - 99 mg/dL   BUN 9  6 - 23 mg/dL   Creatinine, Ser 7.82  0.50 - 1.10 mg/dL   Calcium 9.2  8.4 - 95.6 mg/dL   Total Protein 8.0  6.0 - 8.3 g/dL   Albumin 4.2  3.5 - 5.2 g/dL   AST 17  0 - 37 U/L   ALT 24  0 - 35 U/L   Alkaline Phosphatase 61  39 - 117 U/L   Total Bilirubin 0.5  0.3 - 1.2 mg/dL   GFR calc non Af Amer 81 (*) >90 mL/min   GFR calc Af Amer >90  >90 mL/min   Comment:            The eGFR has been calculated     using  the CKD EPI equation.     This calculation has not been     validated in all clinical     situations.     eGFR's persistently     <90 mL/min signify     possible Chronic Kidney Disease.  TSH     Status: None   Collection  Time    11/12/12  7:50 PM      Result Value Range   TSH 3.845  0.350 - 4.500 uIU/mL  HEPATIC FUNCTION PANEL     Status: None   Collection Time    11/12/12  7:50 PM      Result Value Range   Total Protein 7.9  6.0 - 8.3 g/dL   Albumin 4.2  3.5 - 5.2 g/dL   AST 17  0 - 37 U/L   ALT 25  0 - 35 U/L   Alkaline Phosphatase 61  39 - 117 U/L   Total Bilirubin 0.6  0.3 - 1.2 mg/dL   Bilirubin, Direct <1.6  0.0 - 0.3 mg/dL   Indirect Bilirubin NOT CALCULATED  0.3 - 0.9 mg/dL  URINALYSIS, ROUTINE W REFLEX MICROSCOPIC     Status: Abnormal   Collection Time    11/12/12  9:34 PM      Result Value Range   Color, Urine AMBER (*) YELLOW   Comment: BIOCHEMICALS MAY BE AFFECTED BY COLOR   APPearance CLOUDY (*) CLEAR   Specific Gravity, Urine 1.035 (*) 1.005 - 1.030   pH 6.0  5.0 - 8.0   Glucose, UA NEGATIVE  NEGATIVE mg/dL   Hgb urine dipstick NEGATIVE  NEGATIVE   Bilirubin Urine NEGATIVE  NEGATIVE   Ketones, ur NEGATIVE  NEGATIVE mg/dL   Protein, ur NEGATIVE  NEGATIVE mg/dL   Urobilinogen, UA 1.0  0.0 - 1.0 mg/dL   Nitrite NEGATIVE  NEGATIVE   Leukocytes, UA NEGATIVE  NEGATIVE   Comment: MICROSCOPIC NOT DONE ON URINES WITH NEGATIVE PROTEIN, BLOOD, LEUKOCYTES, NITRITE, OR GLUCOSE <1000 mg/dL.  PREGNANCY, URINE     Status: None   Collection Time    11/12/12  9:34 PM      Result Value Range   Preg Test, Ur NEGATIVE  NEGATIVE   Comment:            THE SENSITIVITY OF THIS     METHODOLOGY IS >20 mIU/mL.  URINE RAPID DRUG SCREEN (HOSP PERFORMED)     Status: Abnormal   Collection Time    11/12/12  9:34 PM      Result Value Range   Opiates NONE DETECTED  NONE DETECTED   Cocaine NONE DETECTED  NONE DETECTED   Benzodiazepines POSITIVE (*) NONE DETECTED   Amphetamines NONE DETECTED  NONE DETECTED   Tetrahydrocannabinol POSITIVE (*) NONE DETECTED   Barbiturates NONE DETECTED  NONE DETECTED   Comment:            DRUG SCREEN FOR MEDICAL PURPOSES     ONLY.  IF CONFIRMATION IS NEEDED     FOR ANY  PURPOSE, NOTIFY LAB     WITHIN 5 DAYS.                LOWEST DETECTABLE LIMITS     FOR URINE DRUG SCREEN     Drug Class       Cutoff (ng/mL)     Amphetamine      1000     Barbiturate      200     Benzodiazepine   200  Tricyclics       300     Opiates          300     Cocaine          300     THC              50    Physical Findings: AIMS: Facial and Oral Movements Muscles of Facial Expression: None, normal Lips and Perioral Area: None, normal Jaw: None, normal Tongue: None, normal,Extremity Movements Upper (arms, wrists, hands, fingers): None, normal Lower (legs, knees, ankles, toes): None, normal, Trunk Movements Neck, shoulders, hips: None, normal, Overall Severity Severity of abnormal movements (highest score from questions above): None, normal Incapacitation due to abnormal movements: None, normal Patient's awareness of abnormal movements (rate only patient's report): No Awareness, Dental Status Current problems with teeth and/or dentures?: No Does patient usually wear dentures?: No  CIWA:    COWS:     Treatment Plan Summary: Daily contact with patient to assess and evaluate symptoms and progress in treatment Medication management  Plan:  Will continue current plan and treatment.   Medical Decision Making Problem Points:  Established problem, stable/improving (1), Review of last therapy session (1) and Review of psycho-social stressors (1) Data Points:  Independent review of image, tracing, or specimen (2) Review or order clinical lab tests (1) Review and summation of old records (2) Review of medication regiment & side effects (2) Review or order of Psychological tests (1)  I certify that inpatient services furnished can reasonably be expected to improve the patient's condition.   Misk Galentine 11/14/2012, 12:59 PM
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
## 2012-11-14 NOTE — Progress Notes (Signed)
D: Patient in the dayroom interacting with peers on approach.  Patient states earlier in the day she had increased anxiety.  Patient states she just wants to stp crying.  Patient states she has had many losses in her life recently and it is hard to deal with.  Patient denies SI/HI and denies AVH.                              A: Staff to monitor Q 15 mins for safety.  Encouragement and support offered.  Scheduled medications administered per orders.  Librium and vistaril administered prn or anxiety. R: Patient remains safe on the unit.  Patient visible on the unit and interacting with peers.  Patient taking administered medications.

## 2012-11-14 NOTE — Progress Notes (Signed)
[facility] LCSW Aftercare Discharge Planning Group Note  11/14/2012 12:48 PM  Participation Quality:  Appropriate  Affect:  Appropriate and Depressed  Cognitive:  Appropriate  Insight:  Engaged  Engagement in Group:  Engaged  Modes of Intervention:  Education, Exploration, Problem-solving, Rapport Building and Support  Summary of Progress/Problems:  Patient advised of admitting to hospital with depression and SI due to deaths of two sisters in January and February.  She currently denies SI/HI and rates depression and anxiety at seven/eight.  Wynn Banker 11/14/2012, 12:48 PM

## 2012-11-14 NOTE — Progress Notes (Signed)
D: Pt reports feeling depressed 6/10. Pt reports that she misses her love ones that she have loss. Pt reports that she is learning how to cope with these feelings. Pt reports that she is anxious and is trying to stop depending on taking xanax because it's not good for her. Pt is tearful today. Pt is compliant with treatment, attending groups and taking meds. Pt received prn meds for withdrawal symptoms.   A: Medications administered as ordered per MD. Verbal support given. Pt encouraged to attend groups. 15 minute checks performed for safety.  R: Pt continues to feel depressed. Pt is engaged with other pt's on the unit. Pt is receptive to treatment. Pt expressed working on using coping skills to decrease her depression.
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
## 2012-11-14 NOTE — Progress Notes (Signed)
Adult Psychoeducational Group Note  Date:  11/14/2012 Time:  6:26 PM  Group Topic/Focus:  Self Care:   The focus of this group is to help patients understand the importance of self-care in order to improve or restore emotional, physical, spiritual, interpersonal, and financial health.  Participation Level:  Active  Participation Quality:  Appropriate, Sharing and Supportive  Affect:  Appropriate  Cognitive:  Appropriate  Insight: Appropriate and Good  Engagement in Group:  Engaged  Modes of Intervention:  Discussion, Education and Support  Additional Comments:  Pt participated in group discussion on the multi-faceted nature of the self. Pt actively participated in group by completing the self-care inventory and sharing areas in which she excels in her self-care, as well as areas in which she needs improvement.   Reinaldo Raddle K 11/14/2012, 6:26 PM

## 2012-11-15 DIAGNOSIS — F411 Generalized anxiety disorder: Secondary | ICD-10-CM | POA: Diagnosis present

## 2012-11-15 DIAGNOSIS — F4321 Adjustment disorder with depressed mood: Secondary | ICD-10-CM | POA: Diagnosis present

## 2012-11-15 DIAGNOSIS — F4329 Adjustment disorder with other symptoms: Secondary | ICD-10-CM | POA: Diagnosis present

## 2012-11-15 MED ORDER — HYDROXYZINE HCL 25 MG PO TABS
25.0000 mg | ORAL_TABLET | Freq: Four times a day (QID) | ORAL | Status: DC | PRN
  Administered 2012-11-15 – 2012-11-16 (×5): 25 mg via ORAL
  Filled 2012-11-15: qty 12

## 2012-11-15 MED ORDER — GABAPENTIN 100 MG PO CAPS
200.0000 mg | ORAL_CAPSULE | Freq: Three times a day (TID) | ORAL | Status: DC
  Administered 2012-11-15 – 2012-11-16 (×2): 200 mg via ORAL
  Filled 2012-11-15 (×6): qty 2

## 2012-11-15 NOTE — Progress Notes (Signed)
Patient ID: [name], female   DOB: 30-Jul-1981, 32 y.o.   MRN: [id number] [facility] MD Progress Note  11/15/2012 4:47 PM TIEA MANNINEN  MRN:  [id number] Subjective:  No one told me that my Librium was going to be discontinued! We are not having groups, keeping [place] cooped up like this is making things worse! The nurse does not want seem to want to give me my medications! I feel like I'm going to crawl out of my skin!! I'm tired of waiting on the doctor to show up!  Objective: Yarielys is tearful and labile today. She is anxious and reporting what seems to be withdrawal symptoms from her benzos, as well as THC an alcohol. CIWA is not documented. She is frustrated due to the outbreak of the Norovirus. Her medication is not giving her the immediate relief she is used to with xanax.    Diagnosis:  Axis I: Depression, Major, Alcohol Abuse, Cannabis abus  ADL's:  Intact  Sleep: Fair  Appetite:  Fair Improving "I ate today" Suicidal Ideation:  No Plan:  No "I did not have a plan to kill my self.  I don't feel that way anymore; I want to live I just want to live happy" Homicidal Ideation:  Denies AEB (as evidenced by):  Patient is participating in group sessions. "I have found my self to be a listener and people have came to me individually and I just let them talk.  I am learning to love myself again; basically it is out of my control that people die; I just have to learn to cope and live life through them and for them."  "I was just really low and needed hope"  Psychiatric Specialty Exam: Review of Systems  Gastrointestinal:       Patient states that when ever she has been drinking liquids feel like she is choking "like going down wrong way.  It is not happen every time but it is increasing.  I noticed it over the last few month and now it is increasing.  Discussed with patient needing to see GI specialist or having her PCP refer.  Also instructed that if situation worsened to inform staff    Psychiatric/Behavioral: Positive for depression (Rates 6/10.  Family deaths is the biggest stressor and "my son was added into that".), suicidal ideas and substance abuse. Negative for hallucinations and memory loss. The patient is nervous/anxious (Rates 7/10 "but not all the time; I can tell when it is time for me to take some anxiety medicine."). The patient does not have insomnia.   All other systems reviewed and are negative.    Blood pressure 113/79, pulse 89, temperature 98.2 F (36.8 C), temperature source Oral, resp. rate 20, height 5\' 3"  (1.6 m), weight 82.555 kg (182 lb).Body mass index is 32.25 kg/(m^2).  General Appearance: disheveled  Patent attorney::  Fair  Speech:  Clear and Coherent and Normal Rate  Volume:  Normal  Mood:  irritable  Affect:  labile  Thought Process:  Circumstantial, Goal Directed and Logical  Orientation:  Full (Time, Place, and Person)  Thought Content:  WDL  Suicidal Thoughts:  no  Homicidal Thoughts:  No  Memory:  Immediate;   Good Recent;   Good Remote;   Good  Judgement:  Fair  Insight:  poor  Psychomotor Activity:  Normal  Concentration:  Fair  Recall:  Fair  Akathisia:  No  Handed:  Right  AIMS (if indicated):     Assets:  Communication Skills Desire for Improvement Housing Physical Health Social Support Transportation  Sleep:  Number of Hours: 6.5   Current Medications: Current Facility-Administered Medications  Medication Dose Route Frequency Provider Last Rate Last Dose  . acetaminophen (TYLENOL) tablet 650 mg  650 mg Oral Q6H PRN Shuvon Rankin, NP      . alum & mag hydroxide-simeth (MAALOX/MYLANTA) 200-200-20 MG/5ML suspension 30 mL  30 mL Oral Q4H PRN Shuvon Rankin, NP      . citalopram (CELEXA) tablet 20 mg  20 mg Oral Daily Verne Spurr, PA-C   20 mg at 11/15/12 0814  . gabapentin (NEURONTIN) capsule 200 mg  200 mg Oral TID Verne Spurr, PA-C      . hydrOXYzine (ATARAX/VISTARIL) tablet 25 mg  25 mg Oral QID PRN Karolee Stamps, NP   25 mg at 11/15/12 1405  . magnesium hydroxide (MILK OF MAGNESIA) suspension 30 mL  30 mL Oral Daily PRN Shuvon Rankin, NP   30 mL at 11/13/12 2251  . naproxen (NAPROSYN) tablet 500 mg  500 mg Oral BID PRN Shuvon Rankin, NP   500 mg at 11/12/12 1609  . nicotine (NICODERM CQ - dosed in mg/24 hours) patch 21 mg  21 mg Transdermal Q0600 Himabindu Ravi, MD   21 mg at 11/15/12 1610    Lab Results:  No results found for this or any previous visit (from the past 48 hour(s)).  Physical Findings: AIMS: Facial and Oral Movements Muscles of Facial Expression: None, normal Lips and Perioral Area: None, normal Jaw: None, normal Tongue: None, normal,Extremity Movements Upper (arms, wrists, hands, fingers): None, normal Lower (legs, knees, ankles, toes): None, normal, Trunk Movements Neck, shoulders, hips: None, normal, Overall Severity Severity of abnormal movements (highest score from questions above): None, normal Incapacitation due to abnormal movements: None, normal Patient's awareness of abnormal movements (rate only patient's report): No Awareness, Dental Status Current problems with teeth and/or dentures?: No Does patient usually wear dentures?: No  CIWA:    COWS:     Treatment Plan Summary: Daily contact with patient to assess and evaluate symptoms and progress in treatment Medication management  Plan:   1. Continue crisis management and stabilization. 2. Medication management to reduce current symptoms to base line and improve patient's overall level of functioning 3. Treat health problems as indicated. 4. Develop treatment plan to decrease risk of relapse upon discharge and the need for     readmission. 5. Psycho-social education regarding relapse prevention and self care. 6. Health care follow up as needed for medical problems. 7. Increased Gabapentin to 200mg  TID for anxiety. 8. Continue Vistaril as written. 9. Encouraged the patient to understand her grief process  with psychoeducation. 10. Discussed coping skills and self soothing skills to assist patient through high/anxiety moments. 11. Education done regarding the on set of action with SSRIs as well as Neurontin. 12. Education done regarding the withdrawal from alcohol as well as THC.  Medical Decision Making Problem Points:  Established problem, stable/improving (1), Review of last therapy session (1) and Review of psycho-social stressors (1) Data Points:  Independent review of image, tracing, or specimen (2) Review or order clinical lab tests (1) Review and summation of old records (2) Review of medication regiment & side effects (2) Review or order of Psychological tests (1)  I certify that inpatient services furnished can reasonably be expected to improve the patient's condition.  Rona Ravens. Aliyana Dlugosz RPAC 4:57 PM 11/15/2012

## 2012-11-15 NOTE — Progress Notes (Signed)
D: Patient in her room on approach.  Patient states she is ok.  Patient states she has been laying around all day.  Patient states she wants to get off the addictive medications if she can.  Patient states she has tried to see how long she can go without her prn vistaril.  Patient states she thinks the neurontin is helping some.  Patient states' "I am in control of my life."  Patient denies SI/HI and denies AVH. A: Staff to monitor Q 15 mins for safety.  Encouragement and support offered.  Patient taking administered medications per orders.  Vistaril administered prn for anxiety. R: Patient remains safe on the unit.  Patient cooperative and taking administered medications.

## 2012-11-15 NOTE — Progress Notes (Signed)
[facility] INPATIENT:  Family/Significant Other Suicide Prevention Education  Suicide Prevention Education:  Patient Refusal for Family/Significant Other Suicide Prevention Education: The patient [name] has refused to provide written consent for family/significant other to be provided Family/Significant Other Suicide Prevention Education during admission and/or prior to discharge.  Physician notified.  Wynn Banker 11/15/2012, 9:37 AM

## 2012-11-15 NOTE — H&P (Signed)
I have examined there patient and agree with the findings. I certify that inpatient services furnished can reasonably be expected to improve the patient's condition. Yasmin Dibello, MD, M[facility]  

## 2012-11-15 NOTE — Progress Notes (Signed)
Pt denies SI/HI/AVH. Pt denies pain and show no s/s of distress. Pt continues to state that she is anxious and have  withdrawals but doesn't present with any physical s/s of withdrawals. Pt is depressed. Pt reported that she continues to use coping skills to manage her depression. Pt is med seeking and continues to ask for prn med routinely as if it is scheduled. Pt asked by writer to challenge herself to use coping skills instead of depending on meds. Pt is sitting on her bed reading a magazine. Pt is not in any distress.  Medications administered as ordered prn. Verbal support given. Pt encouraged to work on Pharmacologist for anxiety. 15 minute checks performed for safety.  Pt is depressed.

## 2012-11-16 DIAGNOSIS — F332 Major depressive disorder, recurrent severe without psychotic features: Principal | ICD-10-CM

## 2012-11-16 LAB — VITAMIN D 1,25 DIHYDROXY: Vitamin D3 1, 25 (OH)2: 61 pg/mL

## 2012-11-16 MED ORDER — CITALOPRAM HYDROBROMIDE 40 MG PO TABS
40.0000 mg | ORAL_TABLET | Freq: Every day | ORAL | Status: DC
  Administered 2012-11-17: 40 mg via ORAL
  Filled 2012-11-16 (×2): qty 3
  Filled 2012-11-16 (×3): qty 1

## 2012-11-16 MED ORDER — GABAPENTIN 300 MG PO CAPS
300.0000 mg | ORAL_CAPSULE | Freq: Three times a day (TID) | ORAL | Status: DC
  Administered 2012-11-16 – 2012-11-17 (×3): 300 mg via ORAL
  Filled 2012-11-16 (×3): qty 1
  Filled 2012-11-16: qty 9
  Filled 2012-11-16: qty 1
  Filled 2012-11-16 (×4): qty 9
  Filled 2012-11-16 (×3): qty 1
  Filled 2012-11-16: qty 9
  Filled 2012-11-16: qty 1

## 2012-11-16 NOTE — Progress Notes (Signed)
D: Patient appropriate and cooperative with staff and peers. Patient's affect/mood is depressed and tearful at times. Patient complained of anxiety 8/10. She reported on the self inventory sheet that her sleep is fair, appetite is poor, energy level is low and ability to pay attention is improving. Patient rated depression "6" and feelings of hopelessness "2".  A: Support and encouragement provided to patient. Scheduled medications administered per MD orders. Maintain Q15 minute checks for safety.  R: Patient receptive. Denies SI/HI/AVH. Patient remains safe.

## 2012-11-16 NOTE — Progress Notes (Signed)
Pt watching a movie at this time.  She reports she is feeling a little better this evening since the patients can come out of their rooms a little more today.  She still looks sad, although she is not tearful.  She states she will probably be discharged tomorrow and will follow up with IOP and start meetings with Hospice for grief counseling.  Pt denies SI/HI/AV.  Pt voices no other needs/concerns at this time.  Support and encouragement given.  Safety maintained with q15 minute checks.

## 2012-11-16 NOTE — [facility] Suicide Risk Assessment (Signed)
[facility] INPATIENT:  Family/Significant Other Suicide Prevention Education  Suicide Prevention Education:  Education Completed; [name], Husband, [phone] has been identified by the patient as the family member/significant other with whom the patient will be residing, and identified as the person(s) who will aid the patient in the event of a mental health crisis (suicidal ideations/suicide attempt).  With written consent from the patient, the family member/significant other has been provided the following suicide prevention education, prior to the and/or following the discharge of the patient.  The suicide prevention education provided includes the following:  Suicide risk factors  Suicide prevention and interventions  National Suicide Hotline telephone number  [facility] assessment telephone number  [facility] Emergency Assistance 911  [facility] and/or Residential Mobile Crisis Unit telephone number  Request made of family/significant other to:  Remove weapons (e.g., guns, rifles, knives), all items previously/currently identified as safety concern.  Husband reports patient does not have access to guns.  Remove drugs/medications (over-the-counter, prescriptions, illicit drugs), all items previously/currently identified as a safety concern.  The family member/significant other verbalizes understanding of the suicide prevention education information provided.  The family member/significant other agrees to remove the items of safety concern listed above.  [name] 11/16/2012, 2:21 PM

## 2012-11-16 NOTE — Progress Notes (Addendum)
[facility] MD Progress Note  11/16/2012 10:07 AM [name]  MRN:  [id number] Subjective:  Patient reports being very depressed, crying. States she misses both her sisters who died this year, " they were my best friends". Feeling very anxious. Tolerating neurontin well, some mild decrease in anxiety. States she does not want her kids to see her so emotional.  Diagnosis:   Axis I: Bereavement and Major Depression, Recurrent severe Axis II: Deferred Axis III:  Past Medical History  Diagnosis Date  . Depression   . Abnormal Pap smear and cervical HPV (human papillomavirus)     CIN in 2005  . Tobacco abuse   . Scoliosis 11/12/2012  . Right foot injury 11/12/2012    Took place 10/27/2012   Axis IV: other psychosocial or environmental problems Axis V: 41-50 serious symptoms  ADL's:  Intact  Sleep: Fair  Appetite:  Fair    Psychiatric Specialty Exam: Review of Systems  Neurological: Positive for loss of consciousness.    Blood pressure 110/76, pulse 88, temperature 98.2 F (36.8 C), temperature source Oral, resp. rate 16, height 5\' 3"  (1.6 m), weight 82.555 kg (182 lb).Body mass index is 32.25 kg/(m^2).  General Appearance: Casual  Eye Contact::  Fair  Speech:  Normal Rate  Volume:  Normal  Mood:  Depressed and Dysphoric  Affect:  Constricted, Depressed and Tearful  Thought Process:  Coherent  Orientation:  Full (Time, Place, and Person)  Thought Content:  Rumination  Suicidal Thoughts:  No  Homicidal Thoughts:  No  Memory:  Immediate;   Fair Recent;   Fair Remote;   Fair  Judgement:  Fair  Insight:  Fair  Psychomotor Activity:  Decreased  Concentration:  Fair  Recall:  Fair  Akathisia:  No  Handed:  Right  AIMS (if indicated):     Assets:  Communication Skills Desire for Improvement Housing Social Support  Sleep:  Number of Hours: 6.75   Current Medications: Current Facility-Administered Medications  Medication Dose Route Frequency Provider Last Rate Last Dose   . acetaminophen (TYLENOL) tablet 650 mg  650 mg Oral Q6H PRN Shuvon Rankin, NP      . alum & mag hydroxide-simeth (MAALOX/MYLANTA) 200-200-20 MG/5ML suspension 30 mL  30 mL Oral Q4H PRN Shuvon Rankin, NP      . citalopram (CELEXA) tablet 20 mg  20 mg Oral Daily Verne Spurr, PA-C   20 mg at 11/16/12 0814  . gabapentin (NEURONTIN) capsule 300 mg  300 mg Oral TID Shereda Graw, MD      . hydrOXYzine (ATARAX/VISTARIL) tablet 25 mg  25 mg Oral QID PRN Karolee Stamps, NP   25 mg at 11/16/12 8469  . magnesium hydroxide (MILK OF MAGNESIA) suspension 30 mL  30 mL Oral Daily PRN Shuvon Rankin, NP   30 mL at 11/13/12 2251  . naproxen (NAPROSYN) tablet 500 mg  500 mg Oral BID PRN Shuvon Rankin, NP   500 mg at 11/12/12 1609  . nicotine (NICODERM CQ - dosed in mg/24 hours) patch 21 mg  21 mg Transdermal Q0600 Brahim Dolman, MD   21 mg at 11/16/12 6295    Lab Results: No results found for this or any previous visit (from the past 48 hour(s)).  Physical Findings: AIMS: Facial and Oral Movements Muscles of Facial Expression: None, normal Lips and Perioral Area: None, normal Jaw: None, normal Tongue: None, normal,Extremity Movements Upper (arms, wrists, hands, fingers): None, normal Lower (legs, knees, ankles, toes): None, normal, Trunk Movements Neck, shoulders,  hips: None, normal, Overall Severity Severity of abnormal movements (highest score from questions above): None, normal Incapacitation due to abnormal movements: None, normal Patient's awareness of abnormal movements (rate only patient's report): No Awareness, Dental Status Current problems with teeth and/or dentures?: No Does patient usually wear dentures?: No  CIWA:    COWS:     Treatment Plan Summary: Daily contact with patient to assess and evaluate symptoms and progress in treatment Medication management  Plan: Increase Neurontin to 300mg  po tid. Increase Celexa to 40mg . Encouraged patient to consider IOP upon discharge to  address her grief.  Medical Decision Making Problem Points:  Established problem, stable/improving (1), Review of last therapy session (1) and Review of psycho-social stressors (1) Data Points:  Review of medication regiment & side effects (2) Review of new medications or change in dosage (2)  I certify that inpatient services furnished can reasonably be expected to improve the patient's condition.   Shuvon B. Rankin FNP-BC Family Nurse Practitioner, Board Certified   [name] 11/16/2012, 10:07 AM

## 2012-11-16 NOTE — Tx Team (Signed)
Interdisciplinary Treatment Plan Update   Date Reviewed:  11/16/2012  Time Reviewed:  9:34 AM  Progress in Treatment:   Attending groups: Yes Participating in groups: Yes Taking medication as prescribed: Yes  Tolerating medication: Yes Family/Significant other contact made: Patient refused to give consent for family contact. Patient understands diagnosis: Yes  Discussing patient identified problems/goals with staff: Yes Medical problems stabilized or resolved: Yes Denies suicidal/homicidal ideation: Yes Patient has not harmed self or others: Yes  For review of initial/current patient goals, please see plan of care.  Estimated Length of Stay:  1-2 days   Reasons for Continued Hospitalization:  Anxiety Depression Medication stabilization  New Problems/Goals identified:    Discharge Plan or Barriers:   Home with outpatient follow up with Dr. Evelene Croon  Additional Comments:  Patient is denying SI/HI today.  She rates depression at six and anxiety at six.  Patient very tearful during meeting.  She is in agreement for referral to Hospice and to [facility]-IOP.   Attendees:  Patient: [name], 11/16/2012 9:34 AM   Signature: Patrick North, MD 11/16/2012 9:34 AM  Signature: 11/16/2012 9:34 AM  Signature:  11/16/2012 9:34 AM  Signature: Harold Barban,  RN 11/16/2012 9:34 AM  Signature:  Leighton Parody,  RN 11/16/2012 9:34 AM  Signature:  Juline Patch, LCSW 11/16/2012 9:34 AM  Signature: Silverio Decamp, PMH-NP 11/16/2012 9:34 AM  Signature:  11/16/2012 9:34 AM  Signature:  11/16/2012 9:34 AM  Signature:    Signature:    Signature:      Scribe for Treatment Team:   Juline Patch,  11/16/2012 9:34 AM

## 2012-11-17 MED ORDER — CITALOPRAM HYDROBROMIDE 40 MG PO TABS: 40.0000 mg | ORAL_TABLET | Freq: Every day | ORAL | Status: DC

## 2012-11-17 MED ORDER — HYDROXYZINE HCL 25 MG PO TABS: 25.0000 mg | ORAL_TABLET | Freq: Four times a day (QID) | ORAL | Status: DC | PRN

## 2012-11-17 MED ORDER — GABAPENTIN 300 MG PO CAPS: 300.0000 mg | ORAL_CAPSULE | Freq: Three times a day (TID) | ORAL | Status: DC

## 2012-11-17 NOTE — Progress Notes (Signed)
Patient denies SI/HI, denies A/V hallucinations. Patient verbalizes understanding of discharge instructions, follow up care and prescriptions. Patient given all belongings from [facility] locker. Patient escorted out by staff, transported by family. 
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
## 2012-11-17 NOTE — [facility] Suicide Risk Assessment (Signed)
Suicide Risk Assessment  Discharge Assessment     Demographic Factors:  Female, caucasian, married  Mental Status Per Nursing Assessment::   On Admission:  NA  Current Mental Status by Physician: Alert and oriented to 4. Denies aH/VH/SI/HI.  Loss Factors: Loss of both sisters recently  Historical Factors: Impulsivity  Risk Reduction Factors:   Employed, Positive social support and Positive coping skills or problem solving skills  Continued Clinical Symptoms:  Depression:   Recent sense of peace/wellbeing  Cognitive Features That Contribute To Risk:  Cognitively intact   Suicide Risk:  Minimal: No identifiable suicidal ideation.  Patients presenting with no risk factors but with morbid ruminations; may be classified as minimal risk based on the severity of the depressive symptoms  Discharge Diagnoses:   AXIS I:  Major Depression, Recurrent severe AXIS II:  Deferred AXIS III:   Past Medical History  Diagnosis Date  . Depression   . Abnormal Pap smear and cervical HPV (human papillomavirus)     CIN in 2005  . Tobacco abuse   . Scoliosis 11/12/2012  . Right foot injury 11/12/2012    Took place 10/27/2012   AXIS IV:  other psychosocial or environmental problems AXIS V:  61-70 mild symptoms  Plan Of Care/Follow-up recommendations:  Activity:  as tolerated Diet:  regular Follow up with outpatient appointments. Is patient on multiple antipsychotic therapies at discharge:  No   Has Patient had three or more failed trials of antipsychotic monotherapy by history:  No  Recommended Plan for Multiple Antipsychotic Therapies: NA  [name] 11/17/2012, 9:53 AM

## 2012-11-17 NOTE — Progress Notes (Signed)
[facility] Adult Case Management Discharge Plan :  Will you be returning to the same living situation after discharge: Yes,  Patient is returning to her home. At discharge, do you have transportation home?:Yes,  Patient will arrange transportation home. Do you have the ability to pay for your medications:Yes,  Patient can afford medications.  Release of information consent forms completed and in the chart;  Patient's signature needed at discharge.  Patient to Follow up at: Follow-up Information   Follow up with [facility] On 12/08/2012. (You are scheduled with Dr. Evelene Croon on Thursday, December 08, 2012 @ 1:45 PM)    Contact information:   [address] [place], [place]   [address]  [phone]      Follow up with [facility] On 11/21/2012. (You are scheduled to start Mental Health Intensive Outpatient Program on Monday, November 21, 2012 at 8:45 AM)    Contact information:   [address]  [place], [place]   [address]  [phone]      Follow up with Hospice. (Hospice will call you at home regarding follow up with them.)    Contact information:   [address] [place], [place]   [address]  [phone]      Patient denies SI/HI:   Yes,  Patient denies SI/HI or thoughts of self harm.    Safety Planning and Suicide Prevention discussed:  Yes,  Reviewed during aftercare group.  Wynn Banker 11/17/2012, 8:36 AM

## 2012-11-21 ENCOUNTER — Other Ambulatory Visit (HOSPITAL_COMMUNITY): Payer: BC Managed Care – PPO

## 2012-11-22 ENCOUNTER — Other Ambulatory Visit (HOSPITAL_COMMUNITY): Payer: BC Managed Care – PPO

## 2012-11-22 NOTE — Progress Notes (Signed)
Patient Discharge Instructions:  After Visit Summary (AVS): Faxed to: 11/22/12  Discharge Summary Note: Faxed to: 11/22/12  Psychiatric Admission Assessment Note: Faxed to: 11/22/12  Suicide Risk Assessment - Discharge Assessment: Faxed to: 11/22/12  Faxed/Sent to the Next Level Care provider: 11/22/12 Next Level Care Provider Has Access to the EMR, 11/22/12 Faxed to Dr Evelene Croon [facility]) @ [phone] Faxed to Hospice @ [phone] Records provided to [facility] via [facility]/Epic access   Jerelene Redden, 11/22/2012, 2:59 PM

## 2012-11-23 ENCOUNTER — Other Ambulatory Visit (HOSPITAL_COMMUNITY): Payer: BC Managed Care – PPO

## 2012-11-24 ENCOUNTER — Other Ambulatory Visit (HOSPITAL_COMMUNITY): Payer: BC Managed Care – PPO

## 2012-11-25 ENCOUNTER — Other Ambulatory Visit (HOSPITAL_COMMUNITY): Payer: BC Managed Care – PPO

## 2012-11-28 ENCOUNTER — Other Ambulatory Visit (HOSPITAL_COMMUNITY): Payer: BC Managed Care – PPO

## 2012-11-29 ENCOUNTER — Other Ambulatory Visit (HOSPITAL_COMMUNITY): Payer: BC Managed Care – PPO

## 2012-11-30 ENCOUNTER — Other Ambulatory Visit (HOSPITAL_COMMUNITY): Payer: BC Managed Care – PPO

## 2012-12-01 ENCOUNTER — Other Ambulatory Visit (HOSPITAL_COMMUNITY): Payer: BC Managed Care – PPO

## 2012-12-02 ENCOUNTER — Other Ambulatory Visit (HOSPITAL_COMMUNITY): Payer: BC Managed Care – PPO

## 2012-12-05 ENCOUNTER — Other Ambulatory Visit (HOSPITAL_COMMUNITY): Payer: BC Managed Care – PPO

## 2012-12-06 ENCOUNTER — Other Ambulatory Visit (HOSPITAL_COMMUNITY): Payer: BC Managed Care – PPO

## 2012-12-07 ENCOUNTER — Other Ambulatory Visit (HOSPITAL_COMMUNITY): Payer: BC Managed Care – PPO

## 2012-12-08 ENCOUNTER — Other Ambulatory Visit (HOSPITAL_COMMUNITY): Payer: BC Managed Care – PPO

## 2012-12-09 ENCOUNTER — Other Ambulatory Visit (HOSPITAL_COMMUNITY): Payer: BC Managed Care – PPO

## 2012-12-12 ENCOUNTER — Other Ambulatory Visit (HOSPITAL_COMMUNITY): Payer: BC Managed Care – PPO

## 2012-12-22 ENCOUNTER — Inpatient Hospital Stay (HOSPITAL_COMMUNITY)
Admission: RE | Admit: 2012-12-22 | Discharge: 2012-12-26 | DRG: 430 | Disposition: A | Discharge status: Home / Self Care | Payer: BC Managed Care – PPO | Attending: Psychiatry | Admitting: Psychiatry

## 2012-12-22 ENCOUNTER — Encounter (HOSPITAL_COMMUNITY): Payer: Self-pay

## 2012-12-22 DIAGNOSIS — F4321 Adjustment disorder with depressed mood: Secondary | ICD-10-CM

## 2012-12-22 DIAGNOSIS — F1010 Alcohol abuse, uncomplicated: Secondary | ICD-10-CM

## 2012-12-22 DIAGNOSIS — F32A Depression, unspecified: Secondary | ICD-10-CM

## 2012-12-22 DIAGNOSIS — F17200 Nicotine dependence, unspecified, uncomplicated: Secondary | ICD-10-CM | POA: Diagnosis present

## 2012-12-22 DIAGNOSIS — Z79899 Other long term (current) drug therapy: Secondary | ICD-10-CM

## 2012-12-22 DIAGNOSIS — F1210 Cannabis abuse, uncomplicated: Secondary | ICD-10-CM

## 2012-12-22 DIAGNOSIS — Z634 Disappearance and death of family member: Secondary | ICD-10-CM

## 2012-12-22 DIAGNOSIS — F411 Generalized anxiety disorder: Secondary | ICD-10-CM

## 2012-12-22 DIAGNOSIS — R45851 Suicidal ideations: Secondary | ICD-10-CM

## 2012-12-22 DIAGNOSIS — F332 Major depressive disorder, recurrent severe without psychotic features: Principal | ICD-10-CM

## 2012-12-22 DIAGNOSIS — F329 Major depressive disorder, single episode, unspecified: Secondary | ICD-10-CM

## 2012-12-22 DIAGNOSIS — F4329 Adjustment disorder with other symptoms: Secondary | ICD-10-CM

## 2012-12-22 LAB — COMPREHENSIVE METABOLIC PANEL
BUN: 16 mg/dL (ref 6–23)
CO2: 30 mEq/L (ref 19–32)
Calcium: 9.4 mg/dL (ref 8.4–10.5)
Creatinine, Ser: 0.93 mg/dL (ref 0.50–1.10)
GFR calc Af Amer: >90 mL/min (ref >90)
GFR calc non Af Amer: 80 mL/min — ABNORMAL LOW (ref >90)
Glucose, Bld: 111 mg/dL — ABNORMAL HIGH (ref 70–99)
Sodium: 135 mEq/L (ref 135–145)
Total Protein: 7.3 g/dL (ref 6.0–8.3)

## 2012-12-22 LAB — RAPID URINE DRUG SCREEN, HOSP PERFORMED
Amphetamines: NOT DETECTED
Benzodiazepines: POSITIVE — AB
Cocaine: NOT DETECTED
Opiates: NOT DETECTED

## 2012-12-22 LAB — CBC
HCT: 39.9 % (ref 36.0–46.0)
Hemoglobin: 13.8 g/dL (ref 12.0–15.0)
MCH: 31.6 pg (ref 26.0–34.0)
MCHC: 34.6 g/dL (ref 30.0–36.0)
MCV: 91.3 fL (ref 78.0–100.0)
RBC: 4.37 MIL/uL (ref 3.87–5.11)

## 2012-12-22 LAB — ETHANOL: Alcohol, Ethyl (B): <11 mg/dL (ref 0–11)

## 2012-12-22 MED ORDER — TRAZODONE HCL 100 MG PO TABS
100.0000 mg | ORAL_TABLET | Freq: Every evening | ORAL | Status: DC | PRN
  Administered 2012-12-22: 100 mg via ORAL
  Filled 2012-12-22: qty 1

## 2012-12-22 MED ORDER — ACETAMINOPHEN 325 MG PO TABS: 650.0000 mg | ORAL_TABLET | Freq: Four times a day (QID) | ORAL | Status: DC | PRN

## 2012-12-22 MED ORDER — RISPERIDONE 0.5 MG PO TABS
0.5000 mg | ORAL_TABLET | Freq: Two times a day (BID) | ORAL | Status: DC
  Administered 2012-12-22 – 2012-12-24 (×4): 0.5 mg via ORAL
  Filled 2012-12-22 (×8): qty 1

## 2012-12-22 MED ORDER — ALUM & MAG HYDROXIDE-SIMETH 200-200-20 MG/5ML PO SUSP: 30.0000 mL | ORAL | Status: DC | PRN

## 2012-12-22 MED ORDER — MAGNESIUM HYDROXIDE 400 MG/5ML PO SUSP: 30.0000 mL | Freq: Every day | ORAL | Status: DC | PRN

## 2012-12-22 MED ORDER — NICOTINE 21 MG/24HR TD PT24
21.0000 mg | MEDICATED_PATCH | Freq: Every day | TRANSDERMAL | Status: DC
  Administered 2012-12-22 – 2012-12-26 (×4): 21 mg via TRANSDERMAL
  Filled 2012-12-22 (×8): qty 1

## 2012-12-22 MED ORDER — NAPROXEN 375 MG PO TABS: 375.0000 mg | ORAL_TABLET | Freq: Three times a day (TID) | ORAL | Status: DC

## 2012-12-22 MED ORDER — NAPROXEN SODIUM 275 MG PO TABS: 440.0000 mg | ORAL_TABLET | Freq: Three times a day (TID) | ORAL | Status: DC

## 2012-12-22 NOTE — [facility] Suicide Risk Assessment (Signed)
Suicide Risk Assessment  Admission Assessment     Nursing information obtained from:  Patient Demographic factors:  Caucasian Current Mental Status:  Suicidal ideation indicated by patient;Self-harm thoughts Loss Factors:  Loss of significant relationship Historical Factors:  Family history of mental illness or substance abuse Risk Reduction Factors:  Responsible for children under 34 years of age;Sense of responsibility to family;Employed;Living with another person, especially a relative;Positive social support  CLINICAL FACTORS:   Depression:   Anhedonia Hopelessness Impulsivity Insomnia Severe  COGNITIVE FEATURES THAT CONTRIBUTE TO RISK:  Thought constriction (tunnel vision)    SUICIDE RISK:   Mild:  Suicidal ideation of limited frequency, intensity, duration, and specificity.  There are no identifiable plans, no associated intent, mild dysphoria and related symptoms, good self-control (both objective and subjective assessment), few other risk factors, and identifiable protective factors, including available and accessible social support.  PLAN OF CARE: Adjust medications as needed. Provide supportive counselling and education.  I certify that inpatient services furnished can reasonably be expected to improve the patient's condition.  [name] 12/22/2012, 12:42 PM
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
## 2012-12-22 NOTE — Progress Notes (Signed)
Patient tearful but cooperative during admission assessment. Patient endorses passive SI, contracts verbally for safety with RN. Patient verbalizes "I don't want to have these bad thoughts but if I was gone right now it wouldn't matter to me." Patient denies HI at this time. Patient denies AVH. Patient stressors include "I lost my Mom a year ago, and  I lost my twin sisters, one in January and one in February of this year." Another stressor verbalized by patient is "my 34 year old son has now decided he wants to live with his biological father after his father has been absent from his life for 8 years." Patient denies any etoh use, states "I haven't had a drink in 2 months." Patient verbalizes "2 nights ago I smoked a bowl(THC) for the first time in two months." Patient verbalizes "I was here a couple of months ago but I could not take the medications I went home on, the celexa makes me shake, the neurotin gives me headaches, and the vistaril did nothing for my anxiety." Patient states "a few days ago I called my doctor, Dr Lafayette Dragon, and got put back on my xanax because it is the only thing that works for me." Patient informed of fall risk status, fall risk assessed "low" at this time. Patient oriented to unit/staff/room. Patient denies any questions/concerns at this time. Patient safe on unit with Q15 minute checks for safety.

## 2012-12-22 NOTE — Progress Notes (Signed)
D: patient in bed crying at the beginning of the shift. She reported that she lost her mother last year, father already dead and her twins sister died early this year; one died in 2022-09-13 and the other one died in 10/14/22. She had passive SI but contracted for safety. She said her only brother wants nothing to do with her and her 34 years old son wants to go live with his father. She felt like she had nothing to live for. Patient had racing thoughts and felt hopeless. A: Writer encouraged and supported patient. Administered her HS medication as prescribed. R: Patient received her medication and returned to her room. She verbally contracted for safety. Q 15 minute check continues as ordered to maintain safety.Marland Kitchen

## 2012-12-22 NOTE — Tx Team (Signed)
Initial Interdisciplinary Treatment Plan  PATIENT STRENGTHS: (choose at least two) Ability for insight Communication skills Financial means General fund of knowledge Motivation for treatment/growth Physical Health Supportive family/friends  PATIENT STRESSORS: Marital or family conflict Traumatic event   PROBLEM LIST: Problem List/Patient Goals Date to be addressed Date deferred Reason deferred Estimated date of resolution  Suicidal Ideations      Depression      Anxiety                                           DISCHARGE CRITERIA:  Improved stabilization in mood, thinking, and/or behavior Medical problems require only outpatient monitoring Need for constant or close observation no longer present  PRELIMINARY DISCHARGE PLAN: Attend aftercare/continuing care group Outpatient therapy  PATIENT/FAMIILY INVOLVEMENT: This treatment plan has been presented to and reviewed with the patient, TANAYA DUNIGAN, and/or family member, .  The patient and family have been given the opportunity to ask questions and make suggestions.  Noah Charon 12/22/2012, 11:39 AM

## 2012-12-22 NOTE — Progress Notes (Signed)
Psychoeducational Group Note  Date:  12/22/2012 Time:  2000  Group Topic/Focus:  Karaoke  Participation Level: Did Not Attend  Participation Quality:  Not Applicable  Affect:  Not Applicable  Cognitive:  Not Applicable  Insight:  Not Applicable  Engagement in Group: Not Applicable  Additional Comments:  Pt stated that she was not in the mood for karaoke and begin to cry, staff escorted pt back to her room.   Kaleen Odea R 12/22/2012, 10:36 PM

## 2012-12-22 NOTE — [facility] Counselor (Addendum)
Adult Psychosocial Assessment Update Interdisciplinary Team  Previous [facility] Health Hospital admissions/discharges:  Admissions Discharges  Date:  11/12/12 Date: 11/17/12  Date: Date:  Date: Date:  Date: Date:  Date: Date:   Changes since the last Psychosocial Assessment (including adherence to outpatient mental health and/or substance abuse treatment, situational issues contributing to decompensation and/or relapse). Patient reports being increasingly depressed with SI.  Patient state"I just don't want to be here anymore."  She shared 63 year old son wanting to live with his father who has not been in his life for eight years, death of mother in 31-Dec-2011 and death of two sisters this year as stressors             Discharge Plan 1. Will you be returning to the same living situation after discharge?   Yes: No:      If no, what is your plan?    Yes, patient plans to return to her home at discharge.       2. Would you like a referral for services when you are discharged? Yes:     If yes, for what services?  No:       Yes, patient is followed by Dr. Evelene Croon for medication management and has been seeing [facility]osp[facility] for grief counseling.       Summary and Recommendations (to be completed by the evaluator) [name] is a 32 year old Caucasian female admitted with Major Depression Disorder.  She will benefit from crisis stabilization, evaluation for medication, psycho-education groups for coping skills development, group therapy and case management for discharge planning.                        Signature:  Wynn Banker, 12/22/2012 4:06 PM

## 2012-12-23 LAB — TSH: TSH: 6.595 u[IU]/mL — ABNORMAL HIGH (ref 0.350–4.500)

## 2012-12-23 MED ORDER — TRAZODONE HCL 100 MG PO TABS: 100.0000 mg | ORAL_TABLET | Freq: Every evening | ORAL | Status: DC | PRN

## 2012-12-23 MED ORDER — HYDROXYZINE HCL 25 MG PO TABS
25.0000 mg | ORAL_TABLET | Freq: Four times a day (QID) | ORAL | Status: DC | PRN
  Administered 2012-12-23 – 2012-12-26 (×7): 25 mg via ORAL

## 2012-12-23 MED ORDER — TRAZODONE HCL 100 MG PO TABS
100.0000 mg | ORAL_TABLET | Freq: Every evening | ORAL | Status: DC | PRN
  Administered 2012-12-23 – 2012-12-25 (×3): 100 mg via ORAL
  Filled 2012-12-23 (×2): qty 1

## 2012-12-23 NOTE — Progress Notes (Signed)
Adult Psychoeducational Group Note  Date:  12/23/2012 Time:  9:30 PM  Group Topic/Focus:  Wrap-Up Group:   The focus of this group is to help patients review their daily goal of treatment and discuss progress on daily workbooks.  Participation Level:  Active  Participation Quality:  Appropriate  Affect:  Appropriate  Cognitive:  Alert  Insight: Appropriate  Engagement in Group:  Engaged  Modes of Intervention:  Discussion  Additional Comments:  Played a game, pt had to pick one positive thing that she enjoyed to do and peers had to guess it. Pt stated that she enjoys to cook for her family.  Kaleen Odea R 12/23/2012, 9:30 PM
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
## 2012-12-23 NOTE — Progress Notes (Signed)
[facility] Group Notes:  (Nursing/MHT/Case Management/Adjunct)  Date:  12/23/2012  Time:  1100  Type of Therapy:Therapeutic actvity  Participation Level:  Active  Participation Quality:  Appropriate and Attentive  Affect:  Appropriate  Cognitive:  Alert and Appropriate  Insight:  Good  Engagement in Group:  Supportive  Modes of Intervention:  Activity and Socialization  Summary of Progress/Problems:  [name] 12/23/2012, 11:48 AM

## 2012-12-23 NOTE — Progress Notes (Signed)
D: Patient in the dayroom on approach.  Patient appears flat and depressed.  Patient states she is having a hard time dealing with the death of her mother and her two sisters.  Patient states she had not been taking her medication plus she had been missing appointments with her therapist due to a conflict with her schedule and being sick.  Patient states she needs to continue to take her medications and make sure she goes to see her therapist.  Patient denies SI/HI and denies AVH.   A: Staff to monitor Q 15 mins for safety.  Encouragement and support offered.  No scheduled medications administered tonight.   R: Patient remains safe on the unit.  Patient attended group tonight.  Patient calm, cooperative and taking administered medications.

## 2012-12-23 NOTE — Progress Notes (Signed)
[facility] MD Progress Note  12/23/2012 11:39 AM KHAMILLE Reilly  MRN:  [id number] Subjective:  [name] reports feeling severely depressed today rating her depression at nine and anxiety at eight. She reports having thoughts to run car off road if outside of hospital. Patient states "I don't feel good. I'm really having a hard time." She describes being "so quick to anger and get irritable for no good reason." She expresses sadness about being in the hospital over mother's day but states "I really need to be here." Patient inquired as to the purpose of her medications and it was explained that risperdal fits many of the symptoms that she is describing. Challis reports that various antidepressants in the past did not help her but made her feel worse. Patient requesting to have a prn medicine available for anxiety. Patient was open to talking about conflict with her son since his father has come back into his life. The patient reports this is her biggest stressor right now. She is also still grieving the loss of several family members.  Diagnosis:   Axis I: Bereavement and Major Depression, Recurrent severe Axis II: Deferred Axis III:  Past Medical History  Diagnosis Date  . Depression   . Abnormal Pap smear and cervical HPV (human papillomavirus)     CIN in 2005  . Tobacco abuse   . Scoliosis 11/12/2012  . Right foot injury 11/12/2012    Took place 10/27/2012   Axis IV: other psychosocial or environmental problems Axis V: 41-50 serious symptoms  ADL's:  Intact  Sleep: Poor  Appetite:  Poor  Suicidal Ideation:  Plan:  Thoughts to drive car off the road Intent:  Does not feels safe outside the hospital Means:  Not currently due to hospitalization Homicidal Ideation:  Denies AEB (as evidenced by):  Psychiatric Specialty Exam: Review of Systems  Constitutional: Negative.   HENT: Negative.   Eyes: Negative.   Respiratory: Negative.   Cardiovascular: Negative.   Gastrointestinal: Negative.    Genitourinary: Negative.   Musculoskeletal: Negative.   Skin: Negative.   Neurological: Negative.   Endo/Heme/Allergies: Negative.   Psychiatric/Behavioral: Positive for depression and suicidal ideas. Negative for hallucinations, memory loss and substance abuse. The patient is nervous/anxious and has insomnia.     Blood pressure 119/78, pulse 97, temperature 97.6 F (36.4 C), temperature source Oral, resp. rate 16, height 5' 4.5" (1.638 m), weight 84.369 kg (186 lb), last menstrual period 12/15/2012, SpO2 100.00%.Body mass index is 31.45 kg/(m^2).  General Appearance: Casual  Eye Contact::  Good  Speech:  Clear and Coherent  Volume:  Normal  Mood:  Anxious and Depressed  Affect:  Depressed  Thought Process:  Coherent and Goal Directed  Orientation:  Full (Time, Place, and Person)  Thought Content:  WDL  Suicidal Thoughts:  Yes.  with intent/plan  Homicidal Thoughts:  No  Memory:  Immediate;   Good Recent;   Good Remote;   Good  Judgement:  Fair  Insight:  Good  Psychomotor Activity:  Normal  Concentration:  Poor  Recall:  Good  Akathisia:  No  Handed:  Right  AIMS (if indicated):     Assets:  Communication Skills Desire for Improvement Housing Intimacy Leisure Time Physical Health Resilience Social Support Talents/Skills Vocational/Educational  Sleep:  Number of Hours: 6.5   Current Medications: Current Facility-Administered Medications  Medication Dose Route Frequency Provider Last Rate Last Dose  . acetaminophen (TYLENOL) tablet 650 mg  650 mg Oral Q6H PRN Patrick North, MD      .  hydrOXYzine (ATARAX/VISTARIL) tablet 25 mg  25 mg Oral Q6H PRN Fransisca Kaufmann, NP      . nicotine (NICODERM CQ - dosed in mg/24 hours) patch 21 mg  21 mg Transdermal Daily Sharon Stapel, MD   21 mg at 12/23/12 0801  . risperiDONE (RISPERDAL) tablet 0.5 mg  0.5 mg Oral BID Lemoyne Nestor, MD   0.5 mg at 12/23/12 0801  . traZODone (DESYREL) tablet 100 mg  100 mg Oral QHS PRN,MR X 1 Fransisca Kaufmann, NP       Facility-Administered Medications Ordered in Other Encounters  Medication Dose Route Frequency Provider Last Rate Last Dose  . naproxen (NAPROSYN) tablet 375 mg  375 mg Oral TID WC Verne Spurr, PA-C        Lab Results:  Results for orders placed during the hospital encounter of 12/22/12 (from the past 48 hour(s))  URINE RAPID DRUG SCREEN (HOSP PERFORMED)     Status: Abnormal   Collection Time    12/22/12  5:35 PM      Result Value Range   Opiates NONE DETECTED  NONE DETECTED   Cocaine NONE DETECTED  NONE DETECTED   Benzodiazepines POSITIVE (*) NONE DETECTED   Amphetamines NONE DETECTED  NONE DETECTED   Tetrahydrocannabinol POSITIVE (*) NONE DETECTED   Barbiturates NONE DETECTED  NONE DETECTED   Comment:            DRUG SCREEN FOR MEDICAL PURPOSES     ONLY.  IF CONFIRMATION IS NEEDED     FOR ANY PURPOSE, NOTIFY LAB     WITHIN 5 DAYS.                LOWEST DETECTABLE LIMITS     FOR URINE DRUG SCREEN     Drug Class       Cutoff (ng/mL)     Amphetamine      1000     Barbiturate      200     Benzodiazepine   200     Tricyclics       300     Opiates          300     Cocaine          300     THC              50    Physical Findings: AIMS: Facial and Oral Movements Muscles of Facial Expression: None, normal Lips and Perioral Area: None, normal Jaw: None, normal Tongue: None, normal,Extremity Movements Upper (arms, wrists, hands, fingers): None, normal Lower (legs, knees, ankles, toes): None, normal, Trunk Movements Neck, shoulders, hips: None, normal, Overall Severity Severity of abnormal movements (highest score from questions above): None, normal Incapacitation due to abnormal movements: None, normal Patient's awareness of abnormal movements (rate only patient's report): No Awareness, Dental Status Current problems with teeth and/or dentures?: No Does patient usually wear dentures?: No  CIWA:    COWS:     Treatment Plan Summary: Daily contact with  patient to assess and evaluate symptoms and progress in treatment Medication management  Plan: Continue crisis management and stabilization.  Medication management: Patient tolerating Risperdal with no reported side effects. Continue Trazodone to improve quality of sleep. Address health issues: Ordered free T4 level to address elevated TSH lab value.    Encouraged patient to attend groups and participate in group counseling sessions and activities.  Discharge plan in progress.  Continue current treatment plan.   Medical Decision Making Problem Points:  Established problem, stable/improving (1) and Review of psycho-social stressors (1) Data Points:  Discuss tests with performing physician (1) Review of medication regiment & side effects (2)  I certify that inpatient services furnished can reasonably be expected to improve the patient's condition.   DAVIS, LAURA NP-C 12/23/2012, 11:39 AM

## 2012-12-23 NOTE — Progress Notes (Signed)
  D) Patient cooperative upon my assessment. Patient tearful and appears anxious, states "I have so much going on at home, it keeps me anxious all the time." Patient encouraged to focus on her recovery while at [facility], patient verbalizes understanding. Patient completed Patient Self Inventory, reports slept "poor," and  appetite is "poor." Patient rates depression as  8 /10, patient rates hopeless feelings as  8/10. Patient endorses passive SI, contracts verbally for safety with RN. Patient states "I feel safe here, I don't think I would hurt myself as long as I am here."  Patient denies HI, denies A/V hallucinations.   A) Patient offered support and encouragement, patient encouraged to discuss feelings/concerns with staff. Patient verbalized understanding. Patient monitored Q15 minutes for safety. Patient met with MD  to discuss today's goals and plan of care.  R) Patient visible in milieu, attending groups in day room and meals in dining room. Patient appropriate with staff and peers.   Patient taking medications as ordered. Will continue to monitor.

## 2012-12-23 NOTE — [facility] Group Notes (Signed)
[facility] LCSW Aftercare Discharge Planning Group Note   12/23/2012 12:13 PM  Participation Quality:  Appropriate  Mood/Affect:  Appropriate, Blunted, Depressed and Flat  Depression Rating:  9  Anxiety Rating:  9  Thoughts of Suicide:  Yes    Will you contract for safety?   Yes  Current AVH:  No  Plan for Discharge/Comments:  Patient reports admitting due to increased depression and SI.   Patient she is followed outpatient by Dr. Evelene Croon and advised of needing therapist.  She is seen at Hospice due to grief from death of mother and two sisters over the past year.  Patient has home, transportation and access to medications.  Transportation Means: Patient has transportation.   Supports:  Patient has a good support system.   Cayetano Mikita, Joesph July

## 2012-12-23 NOTE — [facility] Group Notes (Signed)
[facility] LCSW Group Therapy        Feelings Around Relapse        1:15-2:30 PM    12/23/2012 12:37 PM  Type of Therapy:  Group Therapy  Participation Level:  Minimal  Participation Quality:  Attentive  Affect:  Appropriate, Depressed  Cognitive:  Appropriate  Insight:  Developing/Improving  Engagement in Therapy:  Developing/Improving  Modes of Intervention:   Discussion, Education, Exploration, Problem-Solving, Rapport Building, Support  Summary of Progress/Problems:  Patient shared she was unable to talk and needed to be discharge due to problems at home.  Patient was tearful but stated she did not want to discuss problem privately or in group.  Wynn Banker 12/23/2012, 12:37 PM

## 2012-12-24 MED ORDER — RISPERIDONE 1 MG PO TABS
1.0000 mg | ORAL_TABLET | Freq: Two times a day (BID) | ORAL | Status: DC
  Administered 2012-12-24 – 2012-12-26 (×4): 1 mg via ORAL
  Filled 2012-12-24 (×8): qty 1

## 2012-12-24 NOTE — [facility] Group Notes (Signed)
[facility] Group Notes:  (Clinical Social Work)  12/24/2012   3:00-4:00PM  Summary of Progress/Problems:   The main focus of today's process group was for the patient to identify something in their life that led to their hospitalization that they would like to change, then to discuss their motivation to change.  The patients as a group were quite depressed and focused on missing Mother's Day with family.  The patient expressed that what she wants to change in her life is to be happy and accept that other people make their choices, and she cannot change that.  Her sister Kennyth Arnold died in 09/21/2022, and her sister Victorino Dike died in 2022-10-22.  Already, Jennifer's husband of 20 years has moved another woman into the home and is engaged.  She feels very bad about this, and it is hard for her to be around the family as a result.  Type of Therapy:  Process Group  Participation Level:  Minimal  Participation Quality:  Attentive and Sharing  Affect:  Depressed and Flat  Cognitive:  Appropriate and Oriented  Insight:  Developing/Improving  Engagement in Therapy:  Developing/Improving  Modes of Intervention:  Clarification, Support and Processing, Exploration, Discussion   Ambrose Mantle, LCSW 12/24/2012, 5:35 PM

## 2012-12-24 NOTE — Progress Notes (Signed)
Patient ID: [name], female   DOB: 10/08/1980, 32 y.o.   MRN: [id number] [facility] MD Progress Note  12/24/2012 2:20 PM [name]  MRN:  [id number] Subjective:  [name] reports that she is feeling calmer today. She is still concerned that she is quick to anger but is feeling an overall improvement in her mood. Patient rates her depression at six and anxiety at five. She is sad that she will not see her children on Mother's Day as patient does not want them to see her at the hospital. Patient expresses hopes to discharge on Monday. Patients states "I just hope I keep getting better." Objective: Patient noted to look more relaxed and have better grooming today.  Diagnosis:   Axis I: Bereavement and Major Depression, Recurrent severe Axis II: Deferred Axis III:  Past Medical History  Diagnosis Date  . Depression   . Abnormal Pap smear and cervical HPV (human papillomavirus)     CIN in 2005  . Tobacco abuse   . Scoliosis 11/12/2012  . Right foot injury 11/12/2012    Took place 10/27/2012   Axis IV: other psychosocial or environmental problems Axis V: 41-50 serious symptoms  ADL's:  Intact  Sleep: Poor  Appetite:  Poor  Suicidal Ideation:  Denies Homicidal Ideation:  Denies AEB (as evidenced by):  Psychiatric Specialty Exam: Review of Systems  Constitutional: Negative.   HENT: Negative.   Eyes: Negative.   Respiratory: Negative.   Cardiovascular: Negative.   Gastrointestinal: Negative.   Genitourinary: Negative.   Musculoskeletal: Negative.   Skin: Negative.   Neurological: Negative.   Endo/Heme/Allergies: Negative.   Psychiatric/Behavioral: Positive for depression. Negative for suicidal ideas, hallucinations, memory loss and substance abuse. The patient is nervous/anxious and has insomnia.     Blood pressure 118/78, pulse 101, temperature 97.5 F (36.4 C), temperature source Oral, resp. rate 16, height 5' 4.5" (1.638 m), weight 84.369 kg (186 lb), last menstrual  period 12/15/2012, SpO2 100.00%.Body mass index is 31.45 kg/(m^2).  General Appearance: Casual  Eye Contact::  Good  Speech:  Clear and Coherent  Volume:  Normal  Mood:  Anxious and Depressed  Affect:  Depressed  Thought Process:  Coherent and Goal Directed  Orientation:  Full (Time, Place, and Person)  Thought Content:  WDL  Suicidal Thoughts:  Denies  Homicidal Thoughts:  No  Memory:  Immediate;   Good Recent;   Good Remote;   Good  Judgement:  Fair  Insight:  Good  Psychomotor Activity:  Normal  Concentration:  Poor  Recall:  Good  Akathisia:  No  Handed:  Right  AIMS (if indicated):     Assets:  Communication Skills Desire for Improvement Housing Intimacy Leisure Time Physical Health Resilience Social Support Talents/Skills Vocational/Educational  Sleep:  Number of Hours: 6.75   Current Medications: Current Facility-Administered Medications  Medication Dose Route Frequency Provider Last Rate Last Dose  . acetaminophen (TYLENOL) tablet 650 mg  650 mg Oral Q6H PRN Himabindu Ravi, MD      . hydrOXYzine (ATARAX/VISTARIL) tablet 25 mg  25 mg Oral Q6H PRN Fransisca Kaufmann, NP   25 mg at 12/24/12 1056  . nicotine (NICODERM CQ - dosed in mg/24 hours) patch 21 mg  21 mg Transdermal Daily Himabindu Ravi, MD   21 mg at 12/24/12 1056  . risperiDONE (RISPERDAL) tablet 0.5 mg  0.5 mg Oral BID Himabindu Ravi, MD   0.5 mg at 12/24/12 0753  . traZODone (DESYREL) tablet 100 mg  100 mg Oral  QHS PRN Fransisca Kaufmann, NP   100 mg at 12/23/12 2101   Facility-Administered Medications Ordered in Other Encounters  Medication Dose Route Frequency Provider Last Rate Last Dose  . naproxen (NAPROSYN) tablet 375 mg  375 mg Oral TID WC Verne Spurr, PA-C        Lab Results:  Results for orders placed during the hospital encounter of 12/22/12 (from the past 48 hour(s))  URINE RAPID DRUG SCREEN (HOSP PERFORMED)     Status: Abnormal   Collection Time    12/22/12  5:35 PM      Result Value Range    Opiates NONE DETECTED  NONE DETECTED   Cocaine NONE DETECTED  NONE DETECTED   Benzodiazepines POSITIVE (*) NONE DETECTED   Amphetamines NONE DETECTED  NONE DETECTED   Tetrahydrocannabinol POSITIVE (*) NONE DETECTED   Barbiturates NONE DETECTED  NONE DETECTED   Comment:            DRUG SCREEN FOR MEDICAL PURPOSES     ONLY.  IF CONFIRMATION IS NEEDED     FOR ANY PURPOSE, NOTIFY LAB     WITHIN 5 DAYS.                LOWEST DETECTABLE LIMITS     FOR URINE DRUG SCREEN     Drug Class       Cutoff (ng/mL)     Amphetamine      1000     Barbiturate      200     Benzodiazepine   200     Tricyclics       300     Opiates          300     Cocaine          300     THC              50  T4, FREE     Status: None   Collection Time    12/23/12  7:30 PM      Result Value Range   Free T4 1.13  0.80 - 1.80 ng/dL    Physical Findings: AIMS: Facial and Oral Movements Muscles of Facial Expression: None, normal Lips and Perioral Area: None, normal Jaw: None, normal Tongue: None, normal,Extremity Movements Upper (arms, wrists, hands, fingers): None, normal Lower (legs, knees, ankles, toes): None, normal, Trunk Movements Neck, shoulders, hips: None, normal, Overall Severity Severity of abnormal movements (highest score from questions above): None, normal Incapacitation due to abnormal movements: None, normal Patient's awareness of abnormal movements (rate only patient's report): No Awareness, Dental Status Current problems with teeth and/or dentures?: No Does patient usually wear dentures?: No  CIWA:    COWS:     Treatment Plan Summary: Daily contact with patient to assess and evaluate symptoms and progress in treatment Medication management  Plan: Continue crisis management and stabilization.  Medication management: Patient tolerating Risperdal with no reported side effects. Increase Risperdal to 1 mg po bid due to continued mood instability. Continue Trazodone to improve quality of  sleep. Address health issues: T4 level WNL. Vitals reviewed and stable.   Encouraged patient to attend groups and participate in group counseling sessions and activities.  Discharge plan in progress.  Continue current treatment plan.   Medical Decision Making Problem Points:  Established problem, stable/improving (1) and Review of psycho-social stressors (1) Data Points:  Discuss tests with performing physician (1) Review of medication regiment & side effects (2)  I certify that  inpatient services furnished can reasonably be expected to improve the patient's condition.   Sharen Youngren NP-C 12/24/2012, 2:20 PM

## 2012-12-24 NOTE — Progress Notes (Signed)
D. Pt has been up and has been visible and active in the milieu this evening. Pt has endorsed some feelings of hopelessness and depression but has denied SI. Pt has received medications today without incident and has stated that she is feeling somewhat better today. Pt has not verbalized any complaints of pain this evening. A. Support and encouragement provided, medication education given. R. Pt verbalized understanding, will continue to monitor.

## 2012-12-24 NOTE — Progress Notes (Signed)
D patient states she slept poorly last nite, appetite is improving and going to DR for  Meals, energy level is low and ability to pay attention is improving, depressed 5/10 and hopeless 3/10 today, denies Si or HI and no AVH, taking meds as ordered by MD, attending group but not participating. A q29min safety checks continue and suppport offered, encouraged to participate in group and attend group R patient remains safe on the unit

## 2012-12-24 NOTE — Progress Notes (Signed)
I agree with this note. I certify that inpatient services furnished can reasonably be expected to improve the patient's condition. Kailon Treese, MD, MSP[facility]  

## 2012-12-24 NOTE — Progress Notes (Signed)
Adult Psychoeducational Group Note  Date:  12/24/2012 Time:  0900  Group Topic/Focus:  Self inventory sheet  Participation Level:  Did Not Attend    [name] 12/24/2012, 11:30 AM

## 2012-12-24 NOTE — Progress Notes (Signed)
Adult Psychoeducational Group Note  Date:  12/24/2012 Time:  1015  Group Topic/Focus:  Coping With Mental Health Crisis:   The purpose of this group is to help patients identify strategies for coping with mental health crisis.  Group discusses possible causes of crisis and ways to manage them effectively.  Participation Level:  Minimal  Participation Quality:  Attentive  Affect:  Appropriate  Cognitive:  Appropriate  Insight: Improving  Engagement in Group:  Limited  Modes of Intervention:  Support  Additional Comments:  Limited participation  Roselee Culver 12/24/2012, 12:45 PM

## 2012-12-24 NOTE — Progress Notes (Signed)
[facility] Group Notes:  (Nursing/MHT/Case Management/Adjunct)  Date:  12/24/2012  Time:  11:18 PM  Type of Therapy:  Group Therapy  Participation Level:  Active  Participation Quality:  Appropriate  Affect:  Appropriate  Cognitive:  Appropriate  Insight:  Appropriate  Engagement in Group:  Engaged and Supportive  Modes of Intervention:  Socialization and Support  Summary of Progress/Problems: Pt. Was appropriate in group.  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 Sondra Come 12/24/2012, 11:18 PM

## 2012-12-25 DIAGNOSIS — F1010 Alcohol abuse, uncomplicated: Secondary | ICD-10-CM

## 2012-12-25 NOTE — Progress Notes (Signed)
D: Pt in bed resting with eyes closed. Respirations even and unlabored. Pt appears to be in no signs of distress at this time. A: Q15min checks remains for this pt. R: Pt remains safe at this time.   

## 2012-12-25 NOTE — Progress Notes (Signed)
[facility] Group Notes:  (Nursing/MHT/Case Management/Adjunct)  Date:  12/25/2012  Time:  10:00AM  Type of Therapy:  Psychoeducational Skills Goals and Stress Inventory  Participation Level:  Active  Participation Quality:  Appropriate and Supportive  Affect:  Anxious and Appropriate  Cognitive:  Alert and Appropriate  Insight:  Appropriate and Improving  Engagement in Group:  Engaged and Supportive  Modes of Intervention:  Clarification, Discussion, Education, Exploration, Problem-solving and Support  Summary of Progress/Problems:  Mingo Amber 12/25/2012, 11:19 AM

## 2012-12-25 NOTE — Progress Notes (Signed)
Patient ID: [name], female   DOB: Mar 08, 1981, 32 y.o.   MRN: [id number] D: Pt. Visible on the unit in dayroom and on phone interacting. Pt. Reports depression at "5" of 10, "feel a lot better than Thursday when I came in". Pt. Reports she had visit from her husband today and her 28 year old sent flower and a picture. A: Writer introduced self to client and reviewed medications. Staff will monitored q57min for safety. Staff encouraged group. R: Pt. Is safe on the unit. Pt. Attended group.

## 2012-12-25 NOTE — Progress Notes (Signed)
[facility] MD Progress Note  12/25/2012 8:48 AM [name]  MRN:  [id number] Subjective:  Sleep was interrupted a lot with dreams and feels tired this morning, 4/10 depression but no suicidal thoughts, 6/10 anxiety--worried about her children and Mother's Day.  Discussed focusing on her children and nieces, she plans to go back to Hospice for counseling  Diagnosis:   Axis I: Alcohol Abuse, Anxiety Disorder NOS, Major Depression, Recurrent severe and Substance Abuse Axis II: Deferred Axis III:  Past Medical History  Diagnosis Date  . Depression   . Abnormal Pap smear and cervical HPV (human papillomavirus)     CIN in 2005  . Tobacco abuse   . Scoliosis 11/12/2012  . Right foot injury 11/12/2012    Took place 10/27/2012   Axis IV: other psychosocial or environmental problems, problems related to social environment and problems with primary support group Axis V: 41-50 serious symptoms  ADL's:  Intact  Sleep: Fair  Appetite:  Fair  Suicidal Ideation:  Denies Homicidal Ideation:  Denies  Psychiatric Specialty Exam: Review of Systems  Constitutional: Negative.   HENT: Negative.   Eyes: Negative.   Respiratory: Negative.   Cardiovascular: Negative.   Gastrointestinal: Negative.   Genitourinary: Negative.   Musculoskeletal: Negative.   Skin: Negative.   Neurological: Negative.   Endo/Heme/Allergies: Negative.   Psychiatric/Behavioral: Positive for depression. The patient is nervous/anxious.     Blood pressure 113/80, pulse 107, temperature 98.3 F (36.8 C), temperature source Oral, resp. rate 16, height 5' 4.5" (1.638 m), weight 84.369 kg (186 lb), last menstrual period 12/15/2012, SpO2 100.00%.Body mass index is 31.45 kg/(m^2).  General Appearance: Casual  Eye Contact::  Fair  Speech:  Normal Rate  Volume:  Normal  Mood:  Anxious and Depressed  Affect:  Congruent  Thought Process:  Coherent  Orientation:  Full (Time, Place, and Person)  Thought Content:  WDL  Suicidal  Thoughts:  No  Homicidal Thoughts:  No  Memory:  Immediate;   Fair Recent;   Fair Remote;   Fair  Judgement:  Fair  Insight:  Fair  Psychomotor Activity:  Decreased  Concentration:  Fair  Recall:  Fair  Akathisia:  No  Handed:  Right  AIMS (if indicated):     Assets:  Communication Skills Physical Health Resilience Social Support  Sleep:  Number of Hours: 6.5   Current Medications: Current Facility-Administered Medications  Medication Dose Route Frequency Provider Last Rate Last Dose  . acetaminophen (TYLENOL) tablet 650 mg  650 mg Oral Q6H PRN Himabindu Ravi, MD      . hydrOXYzine (ATARAX/VISTARIL) tablet 25 mg  25 mg Oral Q6H PRN Fransisca Kaufmann, NP   25 mg at 12/25/12 9147  . nicotine (NICODERM CQ - dosed in mg/24 hours) patch 21 mg  21 mg Transdermal Daily Himabindu Ravi, MD   21 mg at 12/24/12 1056  . risperiDONE (RISPERDAL) tablet 1 mg  1 mg Oral BID Fransisca Kaufmann, NP   1 mg at 12/25/12 8295  . traZODone (DESYREL) tablet 100 mg  100 mg Oral QHS PRN Nanine Means, NP   100 mg at 12/24/12 2109   Facility-Administered Medications Ordered in Other Encounters  Medication Dose Route Frequency Provider Last Rate Last Dose  . naproxen (NAPROSYN) tablet 375 mg  375 mg Oral TID WC Verne Spurr, PA-C        Lab Results:  Results for orders placed during the hospital encounter of 12/22/12 (from the past 48 hour(s))  T4, FREE  Status: None   Collection Time    12/23/12  7:30 PM      Result Value Range   Free T4 1.13  0.80 - 1.80 ng/dL    Physical Findings: AIMS: Facial and Oral Movements Muscles of Facial Expression: None, normal Lips and Perioral Area: None, normal Jaw: None, normal Tongue: None, normal,Extremity Movements Upper (arms, wrists, hands, fingers): None, normal Lower (legs, knees, ankles, toes): None, normal, Trunk Movements Neck, shoulders, hips: None, normal, Overall Severity Severity of abnormal movements (highest score from questions above): None,  normal Incapacitation due to abnormal movements: None, normal Patient's awareness of abnormal movements (rate only patient's report): No Awareness, Dental Status Current problems with teeth and/or dentures?: No Does patient usually wear dentures?: No  CIWA:    COWS:     Treatment Plan Summary: Daily contact with patient to assess and evaluate symptoms and progress in treatment Medication management  Plan:  Review of chart, vital signs, medications, and notes. 1-Individual and group therapy 2-Medication management for depression and anxiety:  Medications reviewed with the patient and since her sleep was so interrupted her Trazodone repeat ordered 3-Coping skills for depression, anxiety, and grief 4-Continue crisis stabilization and management 5-Address health issues--monitoring vital signs, stable 6-Treatment plan in progress to prevent relapse of depression and anxiety  Medical Decision Making Problem Points:  Established problem, stable/improving (1) and Review of psycho-social stressors (1) Data Points:  Review of medication regiment & side effects (2)  I certify that inpatient services furnished can reasonably be expected to improve the patient's condition.   Nanine Means, PMH-NP 12/25/2012, 8:48 AM

## 2012-12-26 DIAGNOSIS — F1910 Other psychoactive substance abuse, uncomplicated: Secondary | ICD-10-CM

## 2012-12-26 DIAGNOSIS — F411 Generalized anxiety disorder: Secondary | ICD-10-CM

## 2012-12-26 MED ORDER — TRAZODONE HCL 100 MG PO TABS: 100.0000 mg | ORAL_TABLET | Freq: Every evening | ORAL | Status: DC | PRN

## 2012-12-26 MED ORDER — RISPERIDONE 1 MG PO TABS: 1.0000 mg | ORAL_TABLET | Freq: Two times a day (BID) | ORAL | Status: DC

## 2012-12-26 NOTE — Discharge Summary (Signed)
Physician Discharge Summary Note  Patient:  [name] is an 32 y.o., female MRN:  [id number] DOB:  Dec 02, 1980 Patient phone:  [phone] (home)  Patient address:   [address] [place] [address],   Date of Admission:  12/22/2012 Date of Discharge: 12/26/12  Reason for Admission:  Depression with SI   Discharge Diagnoses: Active Problems:   * No active hospital problems. *  Review of Systems  Constitutional: Negative.   HENT: Negative.   Eyes: Negative.   Respiratory: Negative.   Cardiovascular: Negative.   Gastrointestinal: Negative.   Genitourinary: Negative.   Musculoskeletal: Negative.   Skin: Negative.   Neurological: Negative.   Endo/Heme/Allergies: Negative.   Psychiatric/Behavioral: Positive for depression. Negative for suicidal ideas, hallucinations, memory loss and substance abuse. The patient is nervous/anxious. The patient does not have insomnia.    Axis Diagnosis:   AXIS I:  Anxiety Disorder NOS, Major Depression, Recurrent severe and Substance Abuse AXIS II:  Deferred AXIS III:   Past Medical History  Diagnosis Date  . Depression   . Abnormal Pap smear and cervical HPV (human papillomavirus)     CIN in 2005  . Tobacco abuse   . Scoliosis 11/12/2012  . Right foot injury 11/12/2012    Took place 10/27/2012   AXIS IV:  other psychosocial or environmental problems, problems related to social environment and problems with primary support group AXIS V:  61-70 mild symptoms  Level of Care:  OP  Hospital Course:  [name] is an 32 y.o. female. Pt [facility] reporting depression and suicidal thoughts to jump from a bridge or to drive her truck off the road. She see Dr Evelene Croon who put her back on Xanax in order to help her sleep and she has been unable to find an anti-depressant that works for her. She reports not wanting to get out of bed or serve her clients. She is a Interior and spatial designer. When she does take a client she feels she does not do her  best work nor does she communicate as she normally does. Pt has been isolating from family and friends. She still has not gotten over the death family members who have recently died and the fact that her 58 yo son wants to leave her to go live with his biological father. Pt reports she is afraid to drive as she does not know when the thought of crashing her truck will overwhelm her and she does act accordingly. Pt is unable to contract for safety.       The duration of stay was four days. The patient was seen and evaluated by the Treatment team consisting of Psychiatrist, NP-C, RN, Case Manager, and Therapist for evaluation and treatment plan with goal of stabilization upon discharge. The patient's physical and mental health problems were identified and treated appropriately.      Multiple modalities of treatment were used including medication, individual and group therapies, unit programming, improved nutrition, physical activity, and family sessions as needed. The patient's medications were managed by the MD. Her prior to admission medication of xanax was not continued upon admission. The patient was started on Risperdal 0.5 mg bid for complaints of anger problems and irritability. Trazodone 100 mg was initiated at bedtime to help improve patient quality of sleep. The patient had prn vistaril available every six hours as needed for anxiety. Gianny reported improvement with Risperdal but continued to be "quick to anger and get ill." Her dosage of Risperdal was increased to  1 mg bid on the third day of admission.      The symptoms of depression and anxiety were monitored daily by evaluation by clinical provider.  The patient's mental and emotional status was evaluated by a daily self inventory completed by the patient.      Improvement was demonstrated by declining numbers on the self assessment, improving vital signs, increased cognition, and improvement in mood, sleep, appetite as well as a reduction in  physical symptoms.       The patient was evaluated and found to be stable enough for discharge and was released to home per the initial plan of treatment. Patient met with treatment team on the day of discharge and denied any SI. Patient stated "I feel less aggravated. I can tell the medication is working." Avani appeared motivated to follow through with appointments and will receive therapy through hospice and medication management from Dr. Evelene Croon. She was deemed physically and emotionally stable for discharge. The patient received prescriptions for her new medications.   Mental Status Exam:  For mental status exam please see mental status exam and  suicide risk assessment completed by attending physician prior to discharge.  Consults:  None  Significant Diagnostic Studies:  labs: CBC, Chem Profile, TSH/T4, UDS Positive benzos/THC  Discharge Vitals:   Blood pressure 109/73, pulse 105, temperature 98.3 F (36.8 C), temperature source Oral, resp. rate 16, height 5' 4.5" (1.638 m), weight 84.369 kg (186 lb), last menstrual period 12/15/2012, SpO2 100.00%. Body mass index is 31.45 kg/(m^2). Lab Results:   Results for orders placed during the hospital encounter of 12/22/12 (from the past 72 hour(s))  T4, FREE     Status: None   Collection Time    12/23/12  7:30 PM      Result Value Range   Free T4 1.13  0.80 - 1.80 ng/dL    Physical Findings: AIMS: Facial and Oral Movements Muscles of Facial Expression: None, normal Lips and Perioral Area: None, normal Jaw: None, normal Tongue: None, normal,Extremity Movements Upper (arms, wrists, hands, fingers): None, normal Lower (legs, knees, ankles, toes): None, normal, Trunk Movements Neck, shoulders, hips: None, normal, Overall Severity Severity of abnormal movements (highest score from questions above): None, normal Incapacitation due to abnormal movements: None, normal Patient's awareness of abnormal movements (rate only patient's report): No  Awareness, Dental Status Current problems with teeth and/or dentures?: No Does patient usually wear dentures?: No  CIWA:    COWS:     Psychiatric Specialty Exam: See Psychiatric Specialty Exam and Suicide Risk Assessment completed by Attending Physician prior to discharge.  Discharge destination:  Home  Is patient on multiple antipsychotic therapies at discharge:  No   Has Patient had three or more failed trials of antipsychotic monotherapy by history:  No  Recommended Plan for Multiple Antipsychotic Therapies: N/A     Medication List    STOP taking these medications       ALPRAZolam 1 MG tablet  Commonly known as:  XANAX      TAKE these medications     Indication   multivitamin with minerals Tabs  Take 1 tablet by mouth daily.      naproxen sodium 220 MG tablet  Commonly known as:  ANAPROX  Take 440 mg by mouth every 12 (twelve) hours as needed (headache).      risperiDONE 1 MG tablet  Commonly known as:  RISPERDAL  Take 1 tablet (1 mg total) by mouth 2 (two) times daily. For mood control  Indication:  Easily Angered or Annoyed     traZODone 100 MG tablet  Commonly known as:  DESYREL  Take 1 tablet (100 mg total) by mouth at bedtime as needed for sleep. For sleep and depression   Indication:  Trouble Sleeping           Follow-up Information   Follow up with Dr. Evelene Croon.   Contact information:   [address] [place], [place]   [address]  [phone]       Follow-up recommendations:  Activity:  Resume usual activities Diet:  Regular  Comments:   Take all your medications as prescribed by your mental healthcare provider.  Report any adverse effects and or reactions from your medicines to your outpatient provider promptly.  Patient is instructed and cautioned to not engage in alcohol and or illegal drug use while on prescription medicines.  In the event of worsening symptoms, patient is instructed to call the crisis hotline, 911 and or go to the  nearest ED for appropriate evaluation and treatment of symptoms.  Follow-up with your primary care provider for your other medical issues, concerns and or health care needs.     Total Discharge Time:  Greater than 30 minutes.  SignedFransisca Kaufmann NP-C 12/26/2012, 11:12 AM

## 2012-12-26 NOTE — [facility] Suicide Risk Assessment (Signed)
[facility] INPATIENT:  Family/Significant Other Suicide Prevention Education  Suicide Prevention Education:  Education Completed, Naziya Hegwood, Gayla Doss, [phone] has been identified by the patient as the family member/significant other with whom the patient will be residing, and identified as the person(s) who will aid the patient in the event of a mental health crisis (suicidal ideations/suicide attempt).  With written consent from the patient, the family member/significant other has been provided the following suicide prevention education, prior to the and/or following the discharge of the patient.  The suicide prevention education provided includes the following:  Suicide risk factors  Suicide prevention and interventions  National Suicide Hotline telephone number  [facility] assessment telephone number  [facility] Emergency Assistance 911  [facility] and/or Residential Mobile Crisis Unit telephone number  Request made of family/significant other to:  Remove weapons (e.g., guns, rifles, knives), all items previously/currently identified as safety concern.  Husband reports patient does not have access to guns.  Remove drugs/medications (over-the-counter, prescriptions, illicit drugs), all items previously/currently identified as a safety concern.  The family member/significant other verbalizes understanding of the suicide prevention education information provided.  The family member/significant other agrees to remove the items of safety concern listed above.  Husband he has all information reviewed from previous admission and will drive patient to the hospital, as she did on this admission, if needed.  Wynn Banker 12/26/2012, 12:06 PM

## 2012-12-26 NOTE — Plan of Care (Signed)
Problem: Alteration in mood Goal: LTG-Pt's behavior demonstrates decreased signs of depression (Patient's behavior demonstrates decreased signs of depression to the point the patient is safe to return home and continue treatment in an outpatient setting)  Outcome: Completed/Met Date Met:  12/26/12 Patient is rating depression at three/four today.  She reports groups and medications have really helped.  [name] Lylia Karn, LCSW'12/26/2012

## 2012-12-26 NOTE — Plan of Care (Signed)
Problem: Ineffective individual coping Goal: STG: Patient will participate in after care plan Outcome: Completed/Met Date Met:  12/26/12 Patient is attending group and has talked about problems leading to her admission. Her follow up is scheduled with Dr. Evelene Croon and Hurley Cisco.

## 2012-12-26 NOTE — Progress Notes (Signed)
Adult Psychoeducational Group Note  Date:  12/26/2012 Time:  7:26 PM  Group Topic/Focus:  Self Care:   The focus of this group is to help patients understand the importance of self-care in order to improve or restore emotional, physical, spiritual, interpersonal, and financial health.  Participation Level:  Active  Participation Quality:  Appropriate  Affect:  Appropriate  Cognitive:  Appropriate  Insight: Appropriate and Good  Engagement in Group:  Engaged  Modes of Intervention:  Discussion, Education and Support  Additional Comments:  Pt actively participated in group by completing the "Self-Care Inventory" worksheet. Pt shared that she wants to improve her physical self-care by going to the gym 3x per week.   Reinaldo Raddle K 12/26/2012, 7:26 PM

## 2012-12-26 NOTE — Tx Team (Signed)
Interdisciplinary Treatment Plan Update   Date Reviewed:  12/26/2012  Time Reviewed:  10:20 AM  Progress in Treatment:   Attending groups: Yes Participating in groups: Yes Taking medication as prescribed: Yes  Tolerating medication: Yes Family/Significant other contact made: Yes, contact made with husband Patient understands diagnosis: Yes  Discussing patient identified problems/goals with staff: Yes Medical problems stabilized or resolved: Yes Denies suicidal/homicidal ideation: Yes Patient has not harmed self or others: Yes  For review of initial/current patient goals, please see plan of care.  Estimated Length of Stay:  Discharge home today  Reasons for Continued Hospitalization:   New Problems/Goals identified:    Discharge Plan or Barriers:   Home with outpatient follow up [facility]  Additional Comments:  Patient reports being much improved and attributes being better to medications.  She rates depression at three/four and anxiety at zero.    Attendees:  Patient: [name] 12/26/2012 10:20 AM   Signature: Patrick North, MD 12/26/2012 10:20 AM  Signature: 12/26/2012 10:20 AM  Signature: 12/26/2012 10:20 AM  Signature:Beverly Terrilee Croak, RN 12/26/2012 10:20 AM  Signature:  Neill Loft RN 12/26/2012 10:20 AM  Signature:  Juline Patch, LCSW 12/26/2012 10:20 AM  Signature:  Maseta Dorley,Care Coordinator 12/26/2012 10:20 AM  Signature:  12/26/2012 10:20 AM  Signature: Fransisca Kaufmann, [facility] 12/26/2012 10:20 AM  Signature:    Signature:    Signature:      Scribe for Treatment Team:   Juline Patch,  12/26/2012 10:20 AM

## 2012-12-26 NOTE — [facility] Suicide Risk Assessment (Signed)
Suicide Risk Assessment  Discharge Assessment     Demographic Factors:  Caucasian and Low socioeconomic status, Female  Mental Status Per Nursing Assessment::   On Admission:  Suicidal ideation indicated by patient;Self-harm thoughts  Current Mental Status by Physician: Alert and oriented to 4. Denies AH/VH/SI/Hi.  Loss Factors: Loss of significant relationship and Financial problems/change in socioeconomic status  Historical Factors: Impulsivity  Risk Reduction Factors:   Employed and Positive coping skills or problem solving skills  Continued Clinical Symptoms:  Depression:   Recent sense of peace/wellbeing  Cognitive Features That Contribute To Risk:  Cognitively intact  Suicide Risk:  Minimal: No identifiable suicidal ideation.  Patients presenting with no risk factors but with morbid ruminations; may be classified as minimal risk based on the severity of the depressive symptoms  Discharge Diagnoses:   AXIS I:  Bereavement and Major Depression, Recurrent severe AXIS II:  Deferred AXIS III:   Past Medical History  Diagnosis Date  . Depression   . Abnormal Pap smear and cervical HPV (human papillomavirus)     CIN in 2005  . Tobacco abuse   . Scoliosis 11/12/2012  . Right foot injury 11/12/2012    Took place 10/27/2012   AXIS IV:  other psychosocial or environmental problems AXIS V:  61-70 mild symptoms  Plan Of Care/Follow-up recommendations:  Activity:  as tolerated Diet:  regular Follow up with outpatient appointments.  Is patient on multiple antipsychotic therapies at discharge:  No   Has Patient had three or more failed trials of antipsychotic monotherapy by history:  No  Recommended Plan for Multiple Antipsychotic Therapies: NA  Efraim Vanallen 12/26/2012, 10:26 AM

## 2012-12-26 NOTE — Progress Notes (Signed)
D:  Patient's self inventory sheet, patient has poor sleep, good appetite, low energy level, good attention span.  Rated depression #4, hopelessness #1.  Denied withdrawals.  Denied SI.  Worst pain #1.  Zero pain goal.  After discharge, will see therapist and take her meds.  Will discuss meds for thyroid with MD.  Does have discharge plans.  No problems taking meds after discharge. A:  Medications administered per MD orders.  Will continue to monitor patient every 15 minutes for safety.   Emotional support and encouragement given to patient. R:  Patient denied SI and HI.   Denied A/V hallucinations.  Denied pain.  Patient is looking forward to discharge today.

## 2012-12-26 NOTE — Progress Notes (Signed)
Discharge Note:  Patient discharged home with friend.  Patient received all her belongings, clothing, prescriptions, duffel bag, toiletries.  Patient denied SI and HI.   Denied A/V hallucinations.  Denied pain.  Suicide prevention information given to patient and discussed with patient, who stated she understood and had no questions.  Patient stated she appreciated all the staff has done to assist her while at [facility].

## 2012-12-27 NOTE — Progress Notes (Signed)
I agree with this note. I certify that inpatient services furnished can reasonably be expected to improve the patient's condition. Sneijder Bernards, MD, MSPH  

## 2012-12-28 NOTE — Progress Notes (Signed)
Patient Discharge Instructions:  After Visit Summary (AVS):   Faxed to:  12/28/12 Discharge Summary Note:   Faxed to:  12/28/12 Psychiatric Admission Assessment Note:   Faxed to:  12/28/12 Suicide Risk Assessment - Discharge Assessment:   Faxed to:  12/28/12 Faxed/Sent to the Next Level Care provider:  12/28/12 Faxed to [name] @ [phone]  Jerelene Redden, 12/28/2012, 3:53 PM

## 2013-01-04 ENCOUNTER — Other Ambulatory Visit (HOSPITAL_COMMUNITY)
Admission: RE | Admit: 2013-01-04 | Discharge: 2013-01-04 | Disposition: A | Discharge status: Home / Self Care | Payer: BC Managed Care – PPO | Source: Ambulatory Visit | Attending: Family Medicine | Admitting: Family Medicine

## 2013-01-04 ENCOUNTER — Ambulatory Visit (INDEPENDENT_AMBULATORY_CARE_PROVIDER_SITE_OTHER): Payer: BC Managed Care – PPO | Admitting: Family Medicine

## 2013-01-04 ENCOUNTER — Encounter: Payer: Self-pay | Admitting: Family Medicine

## 2013-01-04 VITALS — BP 120/82 | HR 120 | Temp 98.5°F | Ht 64.5 in | Wt 188.0 lb

## 2013-01-04 DIAGNOSIS — Z0000 Encounter for general adult medical examination without abnormal findings: Secondary | ICD-10-CM

## 2013-01-04 DIAGNOSIS — F1010 Alcohol abuse, uncomplicated: Secondary | ICD-10-CM

## 2013-01-04 DIAGNOSIS — Z124 Encounter for screening for malignant neoplasm of cervix: Secondary | ICD-10-CM

## 2013-01-04 DIAGNOSIS — F17200 Nicotine dependence, unspecified, uncomplicated: Secondary | ICD-10-CM

## 2013-01-04 DIAGNOSIS — E785 Hyperlipidemia, unspecified: Secondary | ICD-10-CM

## 2013-01-04 DIAGNOSIS — Z23 Encounter for immunization: Secondary | ICD-10-CM

## 2013-01-04 DIAGNOSIS — Z01419 Encounter for gynecological examination (general) (routine) without abnormal findings: Secondary | ICD-10-CM | POA: Insufficient documentation

## 2013-01-04 DIAGNOSIS — Z1151 Encounter for screening for human papillomavirus (HPV): Secondary | ICD-10-CM | POA: Insufficient documentation

## 2013-01-04 DIAGNOSIS — F1210 Cannabis abuse, uncomplicated: Secondary | ICD-10-CM

## 2013-01-04 DIAGNOSIS — F329 Major depressive disorder, single episode, unspecified: Secondary | ICD-10-CM

## 2013-01-04 NOTE — Patient Instructions (Addendum)
1. I would advise you to abstain from alcohol and to quit smoking. I am glad you have cut back on the alcohol, please let me know when you are ready to quit smoking.  2. You will get a letter if your pap is normal, if any other follow up is needed, we will call you.  3. Return at your convenience to have your cholesterol checked in the morning.  4. Your Risperdal can cause you to gain weight so I advise regular exercise (see other tips below). Your glucose does not qualify for diabetes but you are at risk so we should keep checking your sugar at least once a year and may get an a1c at next visit.   Thanks and make sure to see me at least yearly as well as your psychiatrist regularly,  Dr. Durene Cal  My 5 to Fitness!  5: fruits and vegetables per day (work on 9 per day if you are at 5) 4: exercise 4-5 times per week for at least 30 minutes (walking counts!) 3: meals per day (don't skip breakfast!) 2: habits to quit -smoking -excess alcohol use (men >2 beer/day; women >1beer/day) 1: sweet per day (2 cookies, 1 small cup of ice cream, 12 oz soda)  These are general tips for healthy living. Try to start with 1 or 2 habit TODAY and make it a part of your life for several months.  Once you have 1 or 2 habits down for several months, try to begin working on your next healthy habit. With every single step you take, you will be leading a healthier lifestyle!

## 2013-01-06 NOTE — Assessment & Plan Note (Signed)
Not interested in quitting. States helps with depression.

## 2013-01-06 NOTE — Progress Notes (Signed)
Subjective:   # Annual Physical Patient here for routine exam. Current complaints: none. Needs TDAP and PAP Gynecologic History Patient's last menstrual period was 12/15/2012. Contraception: tubal ligation Last Pap: 05/28/2009. Results were: normal Last mammogram: N/A. Doing breast welf exams with no concerns.   Depression Patient also updates me on her recent bouts with Depression. Has been hospitalized x2 over last 2 months for SI. She states she has no current SI and is able to state she would come here if this developed. She is compliant with risperidone, trazodone.  She is followed by Dr. Lucianne Muss  Alcohol abuse and THC abuse and tobacco abuse Patient's alcohol intake drastically decreased now to 1-2 drinks approximately twice a week down from drinking multiple beverages most days of the week. States she was doing this to self medicated. Not going to AA as long as compliant with medications. Not interested in quitting smoking given recent SI and wanting to cope with this first. Also not interested in stopping THC as states this helps her.   ROS--See HPI  Past Medical History Patient Active Problem List   Diagnosis Date Noted  . 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 Generalized anxiety disorder 11/15/2012  . Cannabis abuse 11/13/2012    Class: Chronic  . Alcohol abuse 11/13/2012  . Depression   . 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 DEPRESSION, MAJOR 07/02/2010  . 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 TOBACCO USER 05/25/2009   Reviewed problem list.  Medications- reviewed and updated Chief complaint-noted  Objective: BP 120/82  Pulse 120  Temp(Src) 98.5 F (36.9 C) (Oral)  Ht 5' 4.5" (1.638 m)  Wt 188 lb (85.276 kg)  BMI 31.78 kg/m2  LMP 12/15/2012 Gen: NAD, resting comfortably on table, at times appears anxious CV: RRR no murmurs rubs or gallops Lungs: CTAB no crackles, wheeze, rhonchi Abd: soft nontender, normal bowel sounds Skin: warm, dry Neuro: grossly normal, moves  all extremities Breasts: normal appearance, no masses or tenderness Pelvic: cervix normal in appearance, external genitalia normal, no adnexal masses or tenderness, no cervical motion tenderness, uterus normal size, shape, and consistency and vagina normal without discharge Pap completed.    Assessment/Plan:  Alcohol abuse and THC abuse and tobacco abuse Advised cessation of each. Offered our help when she is ready

## 2013-01-06 NOTE — Assessment & Plan Note (Signed)
Not ready to quit. Too much stress from recent hospitalizations for pscyh.

## 2013-01-06 NOTE — Assessment & Plan Note (Signed)
Reduced intake but not ready for full cessation. Appears improved in level of abuse when on medication for depression.

## 2013-01-06 NOTE — Assessment & Plan Note (Signed)
No SI. Patient to continue to follow up closely with psychiatry for titration of meds.

## 2013-01-08 ENCOUNTER — Encounter: Payer: Self-pay | Admitting: Family Medicine

## 2013-02-07 ENCOUNTER — Encounter: Payer: Self-pay | Admitting: Family Medicine

## 2013-02-07 ENCOUNTER — Ambulatory Visit (INDEPENDENT_AMBULATORY_CARE_PROVIDER_SITE_OTHER): Payer: BC Managed Care – PPO | Admitting: Family Medicine

## 2013-02-07 VITALS — BP 117/82 | HR 99 | Temp 98.3°F | Ht 66.0 in | Wt 192.9 lb

## 2013-02-07 DIAGNOSIS — K649 Unspecified hemorrhoids: Secondary | ICD-10-CM | POA: Insufficient documentation

## 2013-02-07 DIAGNOSIS — K6289 Other specified diseases of anus and rectum: Secondary | ICD-10-CM | POA: Insufficient documentation

## 2013-02-07 MED ORDER — LIDOCAINE-HYDROCORTISONE ACE 3-0.5 % RE CREA: 1.0000 | TOPICAL_CREAM | Freq: Two times a day (BID) | RECTAL | Status: DC

## 2013-02-07 NOTE — Assessment & Plan Note (Signed)
Likely due to her hemorrhoid even though it  Does not look thrombosed but she did not allow adequate rectal exam due to pain. Referral to Surgery for hemorrhoidectomy. I recommend continuing sitz bath. I prescribed Lidocaine for pain relieve. F/U with PMD in 3 wks after Surgery assessment.

## 2013-02-07 NOTE — Assessment & Plan Note (Signed)
Symptomatic with pain. Referral and appointment scheduled today to Surgery for possible hemorrhoidectomy. Sitz bath recommended. Instruction on pain control given,( Lidocaine-Hydrocort cream).

## 2013-02-07 NOTE — Progress Notes (Signed)
Subjective:     Patient ID: [name], female   DOB: 1981-03-28, 32 y.o.   MRN: [id number]  HPI Rectal pain: C/O pain in her rectum for 8 yrs,she was told she has hemorrhoid. Pain has been on and off but became constant for the last 2 days.Now pain is about 7/10 in severity,2 days ago her pain was 10/10 in severity,she has been using some rectal medication but she can not insert this due to severe pain,she has been doing sitz bath at home as well.She is constipated,there has been blood in her stool,no N/V,no stomach pain but cramp with her cycle. She will like referral to GI or surgery for hemorrhoidectomy.  Current Outpatient Prescriptions on File Prior to Visit  Medication Sig Dispense Refill  . Multiple Vitamin (MULTIVITAMIN WITH MINERALS) TABS Take 1 tablet by mouth daily.      . naproxen sodium (ANAPROX) 220 MG tablet Take 440 mg by mouth every 12 (twelve) hours as needed (headache).      . risperiDONE (RISPERDAL) 1 MG tablet Take 1 tablet (1 mg total) by mouth 2 (two) times daily. For mood control  60 tablet  0  . traZODone (DESYREL) 100 MG tablet Take 1 tablet (100 mg total) by mouth at bedtime as needed for sleep. For sleep and depression  30 tablet  0   Current Facility-Administered Medications on File Prior to Visit  Medication Dose Route Frequency Provider Last Rate Last Dose  . naproxen (NAPROSYN) tablet 375 mg  375 mg Oral TID WC Verne Spurr, PA-C       Past Medical History  Diagnosis Date  . Depression   . Abnormal Pap smear and cervical HPV (human papillomavirus)     CIN in 2005  . Tobacco abuse   . Scoliosis 11/12/2012  . Right foot injury 11/12/2012    Took place 10/27/2012      Review of Systems  Respiratory: Negative.   Cardiovascular: Negative.   Gastrointestinal: Positive for constipation, blood in stool and rectal pain. Negative for nausea, vomiting, abdominal pain and diarrhea.  Genitourinary: Negative.   All other systems reviewed and are  negative.    Filed Vitals:   02/07/13 1119  BP: 117/82  Pulse: 99  Temp: 98.3 F (36.8 C)  TempSrc: Oral  Height: 5\' 6"  (1.676 m)  Weight: 192 lb 14.4 oz (87.499 kg)       Objective:   Physical Exam  Nursing note and vitals reviewed. Constitutional: She appears well-developed. No distress.  Cardiovascular: Normal rate, regular rhythm, normal heart sounds and intact distal pulses.   No murmur heard. Pulmonary/Chest: Effort normal and breath sounds normal. No respiratory distress. She has no wheezes.  Abdominal: Soft. Bowel sounds are normal. She exhibits no distension and no mass. There is no tenderness. There is no guarding.  Genitourinary: Rectal exam shows external hemorrhoid and tenderness. Rectal exam shows no mass.  She will not allow deep rectal exam due to pain.       Assessment/Plan:

## 2013-02-07 NOTE — Patient Instructions (Addendum)
Hemorrhoids Hemorrhoids are swollen veins around the rectum or anus. There are two types of hemorrhoids:   Internal hemorrhoids. These occur in the veins just inside the rectum. They may poke through to the outside and become irritated and painful.  External hemorrhoids. These occur in the veins outside the anus and can be felt as a painful swelling or hard lump near the anus. CAUSES  Pregnancy.   Obesity.   Constipation or diarrhea.   Straining to have a bowel movement.   Sitting for long periods on the toilet.  Heavy lifting or other activity that caused you to strain.  Anal intercourse. SYMPTOMS   Pain.   Anal itching or irritation.   Rectal bleeding.   Fecal leakage.   Anal swelling.   One or more lumps around the anus.  DIAGNOSIS  Your caregiver may be able to diagnose hemorrhoids by visual examination. Other examinations or tests that may be performed include:   Examination of the rectal area with a gloved hand (digital rectal exam).   Examination of anal canal using a small tube (scope).   A blood test if you have lost a significant amount of blood.  A test to look inside the colon (sigmoidoscopy or colonoscopy). TREATMENT Most hemorrhoids can be treated at home. However, if symptoms do not seem to be getting better or if you have a lot of rectal bleeding, your caregiver may perform a procedure to help make the hemorrhoids get smaller or remove them completely. Possible treatments include:   Placing a rubber band at the base of the hemorrhoid to cut off the circulation (rubber band ligation).   Injecting a chemical to shrink the hemorrhoid (sclerotherapy).   Using a tool to burn the hemorrhoid (infrared light therapy).   Surgically removing the hemorrhoid (hemorrhoidectomy).   Stapling the hemorrhoid to block blood flow to the tissue (hemorrhoid stapling).  HOME CARE INSTRUCTIONS   Eat foods with fiber, such as whole grains, beans,  nuts, fruits, and vegetables. Ask your doctor about taking products with added fiber in them (fibersupplements).  Increase fluid intake. Drink enough water and fluids to keep your urine clear or pale yellow.   Exercise regularly.   Go to the bathroom when you have the urge to have a bowel movement. Do not wait.   Avoid straining to have bowel movements.   Keep the anal area dry and clean. Use wet toilet paper or moist towelettes after a bowel movement.   Medicated creams and suppositories may be used or applied as directed.   Only take over-the-counter or prescription medicines as directed by your caregiver.   Take warm sitz baths for 15 20 minutes, 3 4 times a day to ease pain and discomfort.   Place ice packs on the hemorrhoids if they are tender and swollen. Using ice packs between sitz baths may be helpful.   Put ice in a plastic bag.   Place a towel between your skin and the bag.   Leave the ice on for 15 20 minutes, 3 4 times a day.   Do not use a donut-shaped pillow or sit on the toilet for long periods. This increases blood pooling and pain.  SEEK MEDICAL CARE IF:  You have increasing pain and swelling that is not controlled by treatment or medicine.  You have uncontrolled bleeding.  You have difficulty or you are unable to have a bowel movement.  You have pain or inflammation outside the area of the hemorrhoids. MAKE SURE YOU:    Understand these instructions.  Will watch your condition.  Will get help right away if you are not doing well or get worse. Document Released: 07/31/2000 Document Revised: 07/20/2012 Document Reviewed: 06/07/2012 [employer] Patient Information 2014 [employer].  

## 2013-02-20 ENCOUNTER — Ambulatory Visit (INDEPENDENT_AMBULATORY_CARE_PROVIDER_SITE_OTHER): Payer: BC Managed Care – PPO | Admitting: General Surgery

## 2013-02-20 ENCOUNTER — Encounter (INDEPENDENT_AMBULATORY_CARE_PROVIDER_SITE_OTHER): Payer: Self-pay | Admitting: General Surgery

## 2013-02-20 VITALS — BP 120/68 | HR 120 | Temp 99.7°F | Resp 18 | Ht 66.0 in | Wt 193.0 lb

## 2013-02-20 DIAGNOSIS — K649 Unspecified hemorrhoids: Secondary | ICD-10-CM

## 2013-02-20 NOTE — Patient Instructions (Addendum)
Fiber Chart  You should 25-30g of fiber per day and drinking 8 glasses of water to help your bowels move regularly.  In the chart below you can look up how much fiber you are getting in an average day.  If you are not getting enough fiber, you should add a fiber supplement to your diet.  Examples of this include Metamucil, FiberCon and Citrucel.  These can be purchased at your local grocery store or pharmacy.      http://www.[employer].edu/offices/health/nutritioncoach/AtoZ/handouts/Fiber.pdf   GETTING TO GOOD BOWEL HEALTH. Irregular bowel habits such as constipation can lead to many problems over time.  Having one soft bowel movement a day is the most important way to prevent further problems.  The anorectal canal is designed to handle stretching and feces to safely manage our ability to get rid of solid waste (feces, poop, stool) out of our body.  BUT, hard constipated stools can act like ripping concrete bricks causing inflamed hemorrhoids, anal fissures, abdominal pain and bloating.     The goal: ONE SOFT BOWEL MOVEMENT A DAY!  To have soft, regular bowel movements:    Drink at least 8 tall glasses of water a day.     Take plenty of fiber.  Fiber is the undigested part of plant food that passes into the colon, acting s "natures broom" to encourage bowel motility and movement.  Fiber can absorb and hold large amounts of water. This results in a larger, bulkier stool, which is soft and easier to pass. Work gradually over several weeks up to 6 servings a day of fiber (25g a day even more if needed) in the form of: o Vegetables -- Root (potatoes, carrots, turnips), leafy green (lettuce, salad greens, celery, spinach), or cooked high residue (cabbage, broccoli, etc) o Fruit -- Fresh (unpeeled skin & pulp), Dried (prunes, apricots, cherries, etc ),  or stewed ( applesauce)  o Whole grain breads, pasta, etc (whole wheat)  o Bran cereals    Bulking Agents -- This type of water-retaining fiber generally is  easily obtained each day by one of the following:  o Psyllium bran -- The psyllium plant is remarkable because its ground seeds can retain so much water. This product is available as Metamucil, Konsyl, Effersyllium, Per Diem Fiber, or the less expensive generic preparation in drug and health food stores. Although labeled a laxative, it really is not a laxative.  o Methylcellulose -- This is another fiber derived from wood which also retains water. It is available as Citrucel. o Polyethylene Glycol - and "artificial" fiber commonly called Miralax or Glycolax.  It is helpful for people with gassy or bloated feelings with regular fiber o Flax Seed - a less gassy fiber than psyllium   No reading or other relaxing activity while on the toilet. If bowel movements take longer than 5 minutes, you are too constipated   AVOID CONSTIPATION.  High fiber and water intake usually takes care of this.  Sometimes a laxative is needed to stimulate more frequent bowel movements, but    Laxatives are not a good long-term solution as it can wear the colon out. o Osmotics (Milk of Magnesia, Fleets phosphosoda, Magnesium citrate, MiraLax, GoLytely) are safer than  o Stimulants (Senokot, Castor Oil, Dulcolax, Ex Lax)    o Do not take laxatives for more than 7days in a row.    IF SEVERELY CONSTIPATED, try a Bowel Retraining Program: o Do not use laxatives.  o Eat a diet high in roughage, such as   bran cereals and leafy vegetables.  o Drink six (6) ounces of prune or apricot juice each morning.  o Eat two (2) large servings of stewed fruit each day.  o Take one (1) heaping tablespoon of a psyllium-based bulking agent twice a day. Use sugar-free sweetener when possible to avoid excessive calories.  o Eat a normal breakfast.  o Set aside 15 minutes after breakfast to sit on the toilet, but do not strain to have a bowel movement.  o If you do not have a bowel movement by the third day, use an enema and repeat the above steps.     HEMORRHOIDS    Did you know... Hemorrhoids are one of the most common ailments known.  More than half the population will develop hemorrhoids, usually after age 30.  Millions of Americans currently suffer from hemorrhoids.  The average person suffers in silence for a long period before seeking medical care.  Today's treatment methods make some types of hemorrhoid removal much less painful.  What are hemorrhoids? Often described as "varicose veins of the anus and rectum", hemorrhoids are enlarged, bulging blood vessels in and about the anus and lower rectum. There are two types of hemorrhoids: external and internal, which refer to their location.  External (outside) hemorrhoids develop near the anus and are covered by very sensitive skin. These are usually painless. However, if a blood clot (thrombosis) develops in an external hemorrhoid, it becomes a painful, hard lump. The external hemorrhoid may bleed if it ruptures. Internal (inside) hemorrhoids develop within the anus beneath the lining. Painless bleeding and protrusion during bowel movements are the most common symptom. However, an internal hemorrhoid can cause severe pain if it is completely "prolapsed" - protrudes from the anal opening and cannot be pushed back inside.   What causes hemorrhoids? An exact cause is unknown; however, the upright posture of humans alone forces a great deal of pressure on the rectal veins, which sometimes causes them to bulge. Other contributing factors include:  . Aging  . Chronic constipation or diarrhea  . Pregnancy  . Heredity  . Straining during bowel movements  . Faulty bowel function due to overuse of laxatives or enemas . Spending long periods of time (e.g., reading) on the toilet  Whatever the cause, the tissues supporting the vessels stretch. As a result, the vessels dilate; their walls become thin and bleed. If the stretching and pressure continue, the weakened vessels protrude.  What are  the symptoms? If you notice any of the following, you could have hemorrhoids:  . Bleeding during bowel movements  . Protrusion during bowel movements . Itching in the anal area  . Pain  . Sensitive lump(s)  How are hemorrhoids treated? Mild symptoms can be relieved frequently by increasing the amount of fiber (e.g., fruits, vegetables, breads and cereals) and fluids in the diet. Eliminating excessive straining reduces the pressure on hemorrhoids and helps prevent them from protruding. A sitz bath - sitting in plain warm water for about 10 minutes - can also provide some relief . With these measures, the pain and swelling of most symptomatic hemorrhoids will decrease in two to seven days, and the firm lump should recede within four to six weeks. In cases of severe or persistent pain from a thrombosed hemorrhoid, your physician may elect to remove the hemorrhoid containing the clot with a small incision. Performed under local anesthesia as an outpatient, this procedure generally provides relief. Severe hemorrhoids may require special treatment, much of which can   be performed on an outpatient basis.  . Ligation - the rubber band treatment - works effectively on internal hemorrhoids that protrude with bowel movements. A small rubber band is placed over the hemorrhoid, cutting off its blood supply. The hemorrhoid and the band fall off in a few days and the wound usually heals in a week or two. This procedure sometimes produces mild discomfort and bleeding and may need to be repeated for a full effect.  There is a more intense version of this procedure that is done in the OR as outpatient surgery called THD.  It involves identifying blood vessels leading to the hemorrhoids and then tying them off with sutures.  This method is a little more painful than rubber band ligation but less painful than traditional hemorrhoidectomy and usually does not have to be repeated.  It is best for internal hemorrhoids that  bleed.  Rubber Band Ligation of Internal Hemorrhoids:  A.  Bulging, bleeding, internal hemorrhoid B.  Rubber band applied at the base of the hemorrhoid C.  About 7 days later, the banded hemorrhoid has fallen off leaving a small scar (arrow)  . Injection and Coagulation can also be used on bleeding hemorrhoids that do not protrude. Both methods are relatively painless and cause the hemorrhoid to shrivel up. . Hemorrhoidectomy - surgery to remove the hemorrhoids - is the most complete method for removal of internal and external hemorrhoids. It is necessary when (1) clots repeatedly form in external hemorrhoids; (2) ligation fails to treat internal hemorrhoids; (3) the protruding hemorrhoid cannot be reduced; or (4) there is persistent bleeding. A hemorrhoidectomy removes excessive tissue that causes the bleeding and protrusion. It is done under anesthesia using sutures, and may, depending upon circumstances, require hospitalization and a period of inactivity. Laser hemorrhoidectomies do not offer any advantage over standard operative techniques. They are also quite expensive, and contrary to popular belief, are no less painful.  Do hemorrhoids lead to cancer? No. There is no relationship between hemorrhoids and cancer. However, the symptoms of hemorrhoids, particularly bleeding, are similar to those of colorectal cancer and other diseases of the digestive system. Therefore, it is important that all symptoms are investigated by a physician specially trained in treating diseases of the colon and rectum and that everyone 50 years or older undergo screening tests for colorectal cancer. Do not rely on over-the-counter medications or other self-treatments. See a colorectal surgeon first so your symptoms can be properly evaluated and effective treatment prescribed.  2012 American Society of Colon & Rectal Surgeons     

## 2013-02-20 NOTE — Progress Notes (Signed)
Chief Complaint  Patient presents with  . New Evaluation    eval hems    HISTORY: [name] is a 32 y.o. female who presents to the office with rectal bleding.  Other symptoms include pain with BM's, itching, burning.  This had been occurring for 8 yrs, but getting worse.  She has tried hemorrhoid creams in the past with some success.  Hard BM's makes the symptoms worse.   It is intermittent in nature.  Her bowel habits are regular but feels incomplete evacuation and her bowel movements are usually hard.  She strains quite often.  Her fiber intake is minimal.  She has never had a colonoscopy.  She does feel prolapsing tissue with BM's.     Past Medical History  Diagnosis Date  . Depression   . Abnormal Pap smear and cervical HPV (human papillomavirus)     CIN in 2005  . Tobacco abuse   . Scoliosis 11/12/2012  . Right foot injury 11/12/2012    Took place 10/27/2012      Past Surgical History  Procedure Laterality Date  . Appendectomy  1993  . Tubal ligation  2006        Current Outpatient Prescriptions  Medication Sig Dispense Refill  . ALPRAZolam (XANAX) 1 MG tablet Take 1 mg by mouth 2 (two) times daily.      Marland Kitchen lidocaine-hydrocortisone (ANAMANTEL HC) 3-0.5 % CREA Place 1 Applicatorful rectally 2 (two) times daily.  7 g  0  . naproxen sodium (ANAPROX) 220 MG tablet Take 440 mg by mouth every 12 (twelve) hours as needed (headache).      . risperiDONE (RISPERDAL) 1 MG tablet Take 1 tablet (1 mg total) by mouth 2 (two) times daily. For mood control  60 tablet  0  . traZODone (DESYREL) 100 MG tablet Take 1 tablet (100 mg total) by mouth at bedtime as needed for sleep. For sleep and depression  30 tablet  0   No current facility-administered medications for this visit.   Facility-Administered Medications Ordered in Other Visits  Medication Dose Route Frequency Provider Last Rate Last Dose  . naproxen (NAPROSYN) tablet 375 mg  375 mg Oral TID WC Verne Spurr, PA-C          No  Known Allergies    Family History  Problem Relation Age of Onset  . Cancer Father     prostate  . Cancer Sister     bladder  . Lung disease Mother   . Cancer Maternal Uncle     Lung  . Cancer Paternal Aunt     Breast  . Cancer Maternal Grandfather   . Cancer Paternal Grandfather   . Cancer Paternal Aunt     Breast    History   Social History  . Marital Status: Married    Spouse Name: N/A    Number of Children: N/A  . Years of Education: N/A   Social History Main Topics  . Smoking status: Current Every Day Smoker -- 0.50 packs/day for 19 years    Types: Cigarettes  . Smokeless tobacco: Never Used  . Alcohol Use: 1.2 oz/week    2 Cans of beer per week     Comment: Weekly.  . Drug Use: Yes    Special: Marijuana  . Sexually Active: Yes    Birth Control/ Protection: None   Other Topics Concern  . None   Social History Narrative   Pt also has hx of an aunt and uncle with  lung cancer.             REVIEW OF SYSTEMS - PERTINENT POSITIVES ONLY: Review of Systems - General ROS: negative for - chills, fever or weight loss Hematological and Lymphatic ROS: negative for - bleeding problems, blood clots or bruising Respiratory ROS: no cough, shortness of breath, or wheezing Cardiovascular ROS: no chest pain or dyspnea on exertion Gastrointestinal ROS: positive for - blood in stools negative for - abdominal pain or nausea/vomiting Genito-Urinary ROS: no dysuria, trouble voiding, or hematuria  EXAM: Filed Vitals:   02/20/13 1454  BP: 120/68  Pulse: 120  Temp: 99.7 F (37.6 C)  Resp: 18    General appearance: alert and cooperative Resp: clear to auscultation bilaterally Cardio: regular rate and rhythm GI: soft, non-tender; bowel sounds normal; no masses,  no organomegaly   Procedure: Anoscopy Surgeon: [name] Diagnosis: rectal pain   Assistant: Christella Scheuermann After the risks and benefits were explained, verbal consent was obtained for above procedure  Anesthesia:  none Findings: mild pain with exam, no fissure appreciated, grade 2 non-inflamed internal hemorrhoids, L lateral and anterior skin tags, non-inflamed    ASSESSMENT AND PLAN: [name] is a 32 y.o. F with rectal pain.  On exam she has some non-inflamed hemorrhoidal disease.  I have recommended a high fiber diet, stool softerners and fiber supplement (25g fiber/day in total) and then to continue anal cream and sitz baths for flare ups.  Hopefully this will resolve her issues.  She will call if she is still having issues in 2-3 months on the high fiber diet.     Vanita Panda, MD Colon and Rectal Surgery / General Surgery [facility]      Visit Diagnoses: 1. Hemorrhoids     Primary Care Physician: Tana Conch, MD

## 2013-05-19 ENCOUNTER — Ambulatory Visit: Payer: Self-pay | Admitting: Family Medicine

## 2013-06-21 ENCOUNTER — Emergency Department (HOSPITAL_COMMUNITY)
Admission: EM | Admit: 2013-06-21 | Discharge: 2013-06-22 | Disposition: A | Discharge status: Other Acute Inpatient Hospital | Payer: BC Managed Care – PPO | Attending: Emergency Medicine | Admitting: Emergency Medicine

## 2013-06-21 ENCOUNTER — Encounter (HOSPITAL_COMMUNITY): Payer: Self-pay | Admitting: Emergency Medicine

## 2013-06-21 DIAGNOSIS — R45851 Suicidal ideations: Secondary | ICD-10-CM

## 2013-06-21 DIAGNOSIS — R112 Nausea with vomiting, unspecified: Secondary | ICD-10-CM | POA: Insufficient documentation

## 2013-06-21 DIAGNOSIS — Z79899 Other long term (current) drug therapy: Secondary | ICD-10-CM | POA: Insufficient documentation

## 2013-06-21 DIAGNOSIS — F17200 Nicotine dependence, unspecified, uncomplicated: Secondary | ICD-10-CM | POA: Insufficient documentation

## 2013-06-21 DIAGNOSIS — F329 Major depressive disorder, single episode, unspecified: Secondary | ICD-10-CM

## 2013-06-21 DIAGNOSIS — T1491XA Suicide attempt, initial encounter: Secondary | ICD-10-CM

## 2013-06-21 DIAGNOSIS — T43502A Poisoning by unspecified antipsychotics and neuroleptics, intentional self-harm, initial encounter: Secondary | ICD-10-CM | POA: Insufficient documentation

## 2013-06-21 DIAGNOSIS — T43501A Poisoning by unspecified antipsychotics and neuroleptics, accidental (unintentional), initial encounter: Secondary | ICD-10-CM | POA: Insufficient documentation

## 2013-06-21 DIAGNOSIS — F1210 Cannabis abuse, uncomplicated: Secondary | ICD-10-CM

## 2013-06-21 DIAGNOSIS — Z3202 Encounter for pregnancy test, result negative: Secondary | ICD-10-CM | POA: Insufficient documentation

## 2013-06-21 DIAGNOSIS — F1010 Alcohol abuse, uncomplicated: Secondary | ICD-10-CM

## 2013-06-21 DIAGNOSIS — Z87828 Personal history of other (healed) physical injury and trauma: Secondary | ICD-10-CM | POA: Insufficient documentation

## 2013-06-21 DIAGNOSIS — Z8739 Personal history of other diseases of the musculoskeletal system and connective tissue: Secondary | ICD-10-CM | POA: Insufficient documentation

## 2013-06-21 LAB — ETHANOL: Alcohol, Ethyl (B): 75 mg/dL — ABNORMAL HIGH (ref 0–11)

## 2013-06-21 LAB — COMPREHENSIVE METABOLIC PANEL
Alkaline Phosphatase: 60 U/L (ref 39–117)
BUN: 8 mg/dL (ref 6–23)
GFR calc Af Amer: >90 mL/min (ref >90)
Glucose, Bld: 100 mg/dL — ABNORMAL HIGH (ref 70–99)
Potassium: 3.7 mEq/L (ref 3.5–5.1)
Total Protein: 6.9 g/dL (ref 6.0–8.3)

## 2013-06-21 LAB — CBC
HCT: 39.9 % (ref 36.0–46.0)
Hemoglobin: 14.2 g/dL (ref 12.0–15.0)
MCH: 33.2 pg (ref 26.0–34.0)
MCHC: 35.6 g/dL (ref 30.0–36.0)
MCV: 93.2 fL (ref 78.0–100.0)

## 2013-06-21 LAB — SALICYLATE LEVEL: Salicylate Lvl: <2 mg/dL — ABNORMAL LOW (ref 2.8–20.0)

## 2013-06-21 LAB — ACETAMINOPHEN LEVEL: Acetaminophen (Tylenol), Serum: <15 ug/mL (ref 10–30)

## 2013-06-21 MED ORDER — LORAZEPAM 1 MG PO TABS: 1.0000 mg | ORAL_TABLET | Freq: Three times a day (TID) | ORAL | Status: DC | PRN

## 2013-06-21 MED ORDER — IBUPROFEN 200 MG PO TABS
600.0000 mg | ORAL_TABLET | Freq: Three times a day (TID) | ORAL | Status: DC | PRN
  Administered 2013-06-22: 600 mg via ORAL
  Filled 2013-06-21: qty 3

## 2013-06-21 MED ORDER — ZOLPIDEM TARTRATE 5 MG PO TABS: 5.0000 mg | ORAL_TABLET | Freq: Every evening | ORAL | Status: DC | PRN

## 2013-06-21 MED ORDER — NICOTINE 21 MG/24HR TD PT24
21.0000 mg | MEDICATED_PATCH | Freq: Every day | TRANSDERMAL | Status: DC
  Administered 2013-06-22: 21 mg via TRANSDERMAL
  Filled 2013-06-21: qty 1

## 2013-06-21 MED ORDER — ENOXAPARIN SODIUM 60 MG/0.6ML ~~LOC~~ SOLN: 60.0000 mg | Freq: Once | SUBCUTANEOUS | Status: DC

## 2013-06-21 MED ORDER — ONDANSETRON HCL 4 MG PO TABS: 4.0000 mg | ORAL_TABLET | Freq: Three times a day (TID) | ORAL | Status: DC | PRN

## 2013-06-21 MED ORDER — ALUM & MAG HYDROXIDE-SIMETH 200-200-20 MG/5ML PO SUSP: 30.0000 mL | ORAL | Status: DC | PRN

## 2013-06-21 NOTE — ED Notes (Addendum)
Pt. Attempted to go to bathroom for urine with NT and pt. Began to feel dizzy. Pt. Redirected back to her room with NT.

## 2013-06-21 NOTE — ED Notes (Signed)
Pt says she "wished they would have left me at my aunt's to die."  Pt reports taking 4 shots of vodka and "twenty some" xanax.

## 2013-06-21 NOTE — ED Notes (Signed)
Per EMS pt transported from home after boyfriend called 911, states he witnessed pt take "handful" Xanax 1mg . Per EMS this was suicide attempt. Pt reports multiple deaths in her family recently.

## 2013-06-21 NOTE — ED Notes (Signed)
Pt. And belongings searched and wanded by security. Pt. In new blue scrubs. Pt. Has flip flops, sweat pants, panty, red t-shirt and black hair scrunchie, 2 gold earrings, and 1 gold wedding ring and 2 silver wedding rings. Pt. Belongings locked up in the blue zone at the nurses station.

## 2013-06-21 NOTE — ED Notes (Signed)
Bed: WU98 Expected date: 06/21/13 Expected time: 7:47 PM Means of arrival: Ambulance Comments: Xanax overdose

## 2013-06-21 NOTE — ED Provider Notes (Signed)
CSN: [id number]     Arrival date & time 06/21/13  2016 History   First MD Initiated Contact with Patient 06/21/13 2032     Chief Complaint  Patient presents with  . Medical Clearance   (Consider location/radiation/quality/duration/timing/severity/associated sxs/prior Treatment) The history is provided by the patient, medical records and the EMS personnel. No language interpreter was used.    ALLEENE Reilly is a 32 y.o. female  with a hx of depression, alcohol abuse, tobacco abuse presents to the Emergency Department via EMS after admitted suicide attempt approximately one hour prior to arrival. Patient reports that she drank 6 shots of 5 and then took a "handful" of Xanax.  She reports she must not have taken more than approximately 20, however when the RN counted tablets there are 57 unaccounted for. Associated symptoms include nausea and vomiting.  Patient reports in the last year both her mother and 2 sisters have died causing a great depression. She admits to previous suicidal ideations but never an attempt. She states no inciting factors today that caused this attempt.  Patient denies homicidal ideations and auditory or visual hallucinations.  Past Medical History  Diagnosis Date  . Depression   . Abnormal Pap smear and cervical HPV (human papillomavirus)     CIN in 2005  . Tobacco abuse   . Scoliosis 11/12/2012  . Right foot injury 11/12/2012    Took place 10/27/2012   Past Surgical History  Procedure Laterality Date  . Appendectomy  1993  . Tubal ligation  2006  . Leep     Family History  Problem Relation Age of Onset  . Cancer Father     prostate  . Cancer Sister     bladder  . Lung disease Mother   . Cancer Maternal Uncle     Lung  . Cancer Paternal Aunt     Breast  . Cancer Maternal Grandfather   . Cancer Paternal Grandfather   . Cancer Paternal Aunt     Breast   History  Substance Use Topics  . Smoking status: Current Every Day Smoker -- 0.50 packs/day for 19  years    Types: Cigarettes  . Smokeless tobacco: Never Used  . Alcohol Use: 1.2 oz/week    2 Cans of beer per week     Comment: Weekly.   OB History   Grav Para Term Preterm Abortions TAB SAB Ect Mult Living                 Review of Systems  Constitutional: Negative for fever, diaphoresis, appetite change, fatigue and unexpected weight change.  HENT: Negative for mouth sores and trouble swallowing.   Respiratory: Negative for cough, chest tightness, shortness of breath, wheezing and stridor.   Cardiovascular: Negative for chest pain and palpitations.  Gastrointestinal: Positive for nausea. Negative for vomiting, abdominal pain, diarrhea, constipation, blood in stool, abdominal distention and rectal pain.  Genitourinary: Negative for dysuria, urgency, frequency, hematuria, flank pain and difficulty urinating.  Musculoskeletal: Negative for arthralgias, back pain, myalgias, neck pain and neck stiffness.  Skin: Negative for rash.  Neurological: Negative for weakness, light-headedness and headaches.  Hematological: Negative for adenopathy.  Psychiatric/Behavioral: Positive for suicidal ideas and self-injury. Negative for hallucinations, confusion, sleep disturbance and agitation. The patient is nervous/anxious.   All other systems reviewed and are negative.    Allergies  Review of patient's allergies indicates no known allergies.  Home Medications   Current Outpatient Rx  Name  Route  Sig  Dispense  Refill  . ALPRAZolam (XANAX) 1 MG tablet   Oral   Take 1 mg by mouth 4 (four) times daily as needed for anxiety.          . naproxen sodium (ANAPROX) 220 MG tablet   Oral   Take 440 mg by mouth every 12 (twelve) hours as needed (headache).         . OLANZapine-FLUoxetine (SYMBYAX) 6-25 MG per capsule   Oral   Take 1 capsule by mouth at bedtime.          BP 110/68  Pulse 86  Temp(Src) 97.6 F (36.4 C) (Oral)  Resp 16  SpO2 94%  LMP 06/13/2013 Physical Exam   Nursing note and vitals reviewed. Constitutional: She is oriented to person, place, and time. She appears well-developed and well-nourished. No distress.  Awake, alert, nontoxic appearance  HENT:  Head: Normocephalic and atraumatic.  Mouth/Throat: Oropharynx is clear and moist. No oropharyngeal exudate.  Eyes: Conjunctivae are normal. No scleral icterus.  Neck: Normal range of motion. Neck supple.  Cardiovascular: Normal rate, regular rhythm, normal heart sounds and intact distal pulses.   Pulmonary/Chest: Effort normal and breath sounds normal. No respiratory distress. She has no wheezes.  Clear and equal breath sounds  Abdominal: Soft. Bowel sounds are normal. She exhibits no mass. There is no tenderness. There is no rebound and no guarding.  Abdomen soft and nontender  Musculoskeletal: Normal range of motion. She exhibits no edema.  Lymphadenopathy:    She has no cervical adenopathy.  Neurological: She is alert and oriented to person, place, and time. She exhibits normal muscle tone. Coordination normal.  Speech is clear and goal oriented Moves extremities without ataxia  Skin: Skin is warm and dry. No rash noted. She is not diaphoretic. No erythema.  Superficial lacerations to the right wrist  Psychiatric: She is not actively hallucinating. She exhibits a depressed mood. She expresses suicidal ideation. She expresses no homicidal ideation. She expresses suicidal plans. She expresses no homicidal plans.    ED Course  Procedures (including critical care time) Labs Review Labs Reviewed  COMPREHENSIVE METABOLIC PANEL - Abnormal; Notable for the following:    Glucose, Bld 100 (*)    ALT 43 (*)    Total Bilirubin <0.1 (*)    All other components within normal limits  ETHANOL - Abnormal; Notable for the following:    Alcohol, Ethyl (B) 75 (*)    All other components within normal limits  SALICYLATE LEVEL - Abnormal; Notable for the following:    Salicylate Lvl <2.0 (*)    All  other components within normal limits  ACETAMINOPHEN LEVEL  CBC  URINE RAPID DRUG SCREEN (HOSP PERFORMED)   Imaging Review No results found.  EKG Interpretation   None       MDM   1. Suicidal ideation   2. Suicide attempt   3. Overdose, initial encounter   4. Cannabis abuse   5. Alcohol abuse   6. DEPRESSION, MAJOR      Chauna Osoria Radloff presents with Hx of major depression and tonight an overdose of xanax in combination with EtOH consumption.  Pt was discussed with poison control who recommended 6 hours of observation, fluids and to assessment for respiratory depression.   Record review shows multiple behavioral health admissions for suicidal ideations and 2014.  9:30 PM Informed  By RN that pt was found with IV tubing wrapped around her neck in the room.  IVC papers completed, TTS consulted and all  cords removed from the room.  11:23 PM Pt sleeping comfortably, no respiratory depression, maintaining adequate oxygen saturations, arousable to voice.    12:05 AM Discussed with ACT who recommends inpatient psychiatric care.  Pt admits to them that she also attempted to slit her wrist earlier this evening. Pt will be held here pending adult bed at [facility]     1:40 AM Patient easily arousable and ambulates without difficulty to the bathroom.  Vital signs remained stable.  BP 110/68  Pulse 86  Temp(Src) 97.6 F (36.4 C) (Oral)  Resp 16  SpO2 94%  LMP 06/13/2013    Dierdre Forth, PA-C 06/22/13 0141

## 2013-06-21 NOTE — ED Notes (Addendum)
Pt found w/ IV tubing wrapped around her throat.  IV tubing as well as all remaining cords removed from room.  Curtain open so pt is visible at all times. PA notified, Charge RN notified security to bedside to wand pt.

## 2013-06-21 NOTE — ED Notes (Signed)
Pt resting with eyes closed, RR even and unlabored, easily arousable when VS taken

## 2013-06-21 NOTE — ED Notes (Signed)
EKG given to EDP, Pollina, MD. 

## 2013-06-22 ENCOUNTER — Inpatient Hospital Stay (HOSPITAL_COMMUNITY)
Admission: AD | Admit: 2013-06-22 | Discharge: 2013-06-28 | DRG: 885 | Disposition: A | Discharge status: Home / Self Care | Payer: BC Managed Care – PPO | Source: Intra-hospital | Attending: Psychiatry | Admitting: Psychiatry

## 2013-06-22 ENCOUNTER — Encounter (HOSPITAL_COMMUNITY): Payer: Self-pay | Admitting: General Practice

## 2013-06-22 DIAGNOSIS — F329 Major depressive disorder, single episode, unspecified: Secondary | ICD-10-CM | POA: Diagnosis present

## 2013-06-22 DIAGNOSIS — F411 Generalized anxiety disorder: Secondary | ICD-10-CM

## 2013-06-22 DIAGNOSIS — F17200 Nicotine dependence, unspecified, uncomplicated: Secondary | ICD-10-CM | POA: Diagnosis present

## 2013-06-22 DIAGNOSIS — F332 Major depressive disorder, recurrent severe without psychotic features: Secondary | ICD-10-CM | POA: Diagnosis present

## 2013-06-22 DIAGNOSIS — F1010 Alcohol abuse, uncomplicated: Secondary | ICD-10-CM | POA: Diagnosis present

## 2013-06-22 DIAGNOSIS — F4310 Post-traumatic stress disorder, unspecified: Secondary | ICD-10-CM | POA: Diagnosis present

## 2013-06-22 DIAGNOSIS — Z79899 Other long term (current) drug therapy: Secondary | ICD-10-CM

## 2013-06-22 DIAGNOSIS — F1210 Cannabis abuse, uncomplicated: Secondary | ICD-10-CM

## 2013-06-22 DIAGNOSIS — R45851 Suicidal ideations: Secondary | ICD-10-CM

## 2013-06-22 DIAGNOSIS — F322 Major depressive disorder, single episode, severe without psychotic features: Secondary | ICD-10-CM | POA: Diagnosis present

## 2013-06-22 LAB — RAPID URINE DRUG SCREEN, HOSP PERFORMED
Amphetamines: NOT DETECTED
Barbiturates: NOT DETECTED
Benzodiazepines: POSITIVE — AB
Cocaine: NOT DETECTED
Tetrahydrocannabinol: POSITIVE — AB

## 2013-06-22 MED ORDER — MAGNESIUM HYDROXIDE 400 MG/5ML PO SUSP
30.0000 mL | Freq: Every day | ORAL | Status: DC | PRN
  Administered 2013-06-24 – 2013-06-27 (×2): 30 mL via ORAL

## 2013-06-22 MED ORDER — ALUM & MAG HYDROXIDE-SIMETH 200-200-20 MG/5ML PO SUSP
30.0000 mL | ORAL | Status: DC | PRN
  Administered 2013-06-24: 30 mL via ORAL

## 2013-06-22 NOTE — ED Notes (Signed)
Up to the bathroom 

## 2013-06-22 NOTE — Consult Note (Signed)
  Psychiatric Specialty Exam: Physical Exam  ROS  Blood pressure 111/76, pulse 64, temperature 98.2 F (36.8 C), temperature source Oral, resp. rate 18, last menstrual period 06/13/2013, SpO2 98.00%.There is no weight on file to calculate BMI.  General Appearance: Disheveled  Eye Contact::  Good  Speech:  Clear and Coherent and Normal Rate  Volume:  Normal  Mood:  Anxious and Depressed  Affect:  Appropriate  Thought Process:  Coherent and Logical  Orientation:  Full (Time, Place, and Person)  Thought Content:  Negative  Suicidal Thoughts:  Yes.  with intent/plan  Homicidal Thoughts:  No  Memory:  Immediate;   Good Recent;   Good Remote;   Good  Judgement:  Intact  Insight:  Fair  Psychomotor Activity:  Normal  Concentration:  Good  Recall:  Good  Akathisia:  Negative  Handed:  Right  AIMS (if indicated):     Assets:  Architect Housing Intimacy Physical Health Social Support Transportation Vocational/Educational  Sleep:   poor   Ms Baik says she is not suicidal today and is sorry for all she did yesterday and the stress she put her family through.  Her husband visited and was very uncomfortable with having her home saying she almost succeeded in killing herself last night and is concerned about her.  The nurse also reported she had tried to choke herself with the IV cord when she first came to the ER.  She says she regrets all that now and really wants to go home to her husband and to her job.  Her mother, twin sister and another sister all died within the last year and she has already been admitted twice to psychiatry this year.  All together she seems too risky to discharge and I recommend inpatient bed.

## 2013-06-22 NOTE — BH Assessment (Signed)
Tele Assessment Note   [name] is an 32 y.o. female.  -Clinician spoke to [employer], PA about why patient needed teleassessment: patient was brought to [facility] after she had ingested 20 xanax in an effort to kill herself.    Patient does admit to drinking 3 shots of liquor then taking the overdose of xanax.  She said that the ETOH consumption was independent of the xanax overdose. Pt revealed to this clinician that she had also made cuts to her left wrist prior to taking the xanax.  Patient had been thinking about her recend losses.  She had two sisters die in the early part of the year and her mother died about a year before them.  Patient also had her 34 year old son move from her to live with his biological father.  Patient denies any HI or A/V hallucinations.  Patient minimizes her ETOH consumption and says that it is only a "a few beers on the weekend."  She also smokes about a bowl of marijuana daily.  Patient has been seeing Dr. Evelene Croon for about a year and was admitted to [facility] in March and in May of this year for talking about wanting to kill self.  Patient was discussed with Donell Sievert, PA at [facility].  He accepted patient to 300 hall bed pending bed opening.  Patient care discussed also with Dr. Cathren Laine who was made aware that patient has been accepted to [facility] pending a bed.   Axis I: Adjustment Disorder with Depressed Mood, Depressive Disorder NOS and Substance Abuse Axis II: Deferred Axis III:  Past Medical History  Diagnosis Date  . Depression   . Abnormal Pap smear and cervical HPV (human papillomavirus)     CIN in 2005  . Tobacco abuse   . Scoliosis 11/12/2012  . Right foot injury 11/12/2012    Took place 10/27/2012   Axis IV: other psychosocial or environmental problems and problems with primary support group Axis V: 31-40 impairment in reality testing  Past Medical History:  Past Medical History  Diagnosis Date  . Depression   . Abnormal Pap smear and  cervical HPV (human papillomavirus)     CIN in 2005  . Tobacco abuse   . Scoliosis 11/12/2012  . Right foot injury 11/12/2012    Took place 10/27/2012    Past Surgical History  Procedure Laterality Date  . Appendectomy  1993  . Tubal ligation  2006  . Leep      Family History:  Family History  Problem Relation Age of Onset  . Cancer Father     prostate  . Cancer Sister     bladder  . Lung disease Mother   . Cancer Maternal Uncle     Lung  . Cancer Paternal Aunt     Breast  . Cancer Maternal Grandfather   . Cancer Paternal Grandfather   . Cancer Paternal Aunt     Breast    Social History:  reports that she has been smoking Cigarettes.  She has a 9.5 pack-year smoking history. She has never used smokeless tobacco. She reports that she drinks about 1.2 ounces of alcohol per week. She reports that she uses illicit drugs (Marijuana).  Additional Social History:  Alcohol / Drug Use Pain Medications: See PTA medication list Prescriptions: Pt has xanax prescription and "another medicine that I can't remember." Over the Counter: N/A History of alcohol / drug use?: Yes Substance #1 Name of Substance 1: ETOH, usually beer 1 -  Age of First Use: 32 years of age 56 - Amount (size/oz): Reports drinking "a few beers  on the weekends." 1 - Frequency: Once per week on average 56 - Duration: On-going 1 - Last Use / Amount: 11/05 Substance #2 Name of Substance 2: Marijuana 2 - Age of First Use: 32 years old 2 - Amount (size/oz): One bowl daily 2 - Frequency: Daily use 2 - Duration: On-going 2 - Last Use / Amount: 11/04  CIWA: CIWA-Ar BP: 104/72 mmHg Pulse Rate: 75 COWS:    Allergies: No Known Allergies  Home Medications:  (Not in a hospital admission)  OB/GYN Status:  Patient's last menstrual period was 06/13/2013.  General Assessment Data Location of Assessment: [facility] ED Is this a Tele or Face-to-Face Assessment?: Tele Assessment Is this an Initial Assessment or a  Re-assessment for this encounter?: Initial Assessment Living Arrangements: Spouse/significant other;Children Can pt return to current living arrangement?: Yes Admission Status: Voluntary Is patient capable of signing voluntary admission?: Yes Transfer from: Acute Hospital Referral Source: Self/Family/Friend     [facility] Crisis Care Plan Living Arrangements: Spouse/significant other;Children Name of Psychiatrist: Dr. Evelene Croon Name of Therapist: None     Risk to self Suicidal Ideation: Yes-Currently Present Suicidal Intent: Yes-Currently Present Is patient at risk for suicide?: Yes Suicidal Plan?: Yes-Currently Present Specify Current Suicidal Plan: attempted overdose Access to Means: Yes Specify Access to Suicidal Means: Meds, sharps What has been your use of drugs/alcohol within the last 12 months?: THC daily, some ETOH use Previous Attempts/Gestures: Yes How many times?: 2 Other Self Harm Risks: N/A Triggers for Past Attempts: Anniversary Intentional Self Injurious Behavior: None Family Suicide History: No Recent stressful life event(s): Loss (Comment) (Loss of mother & two sisters in last 20 months) Persecutory voices/beliefs?: No Depression: Yes Depression Symptoms: Despondent;Tearfulness;Guilt;Loss of interest in usual pleasures;Feeling worthless/self pity Substance abuse history and/or treatment for substance abuse?: Yes Suicide prevention information given to non-admitted patients: Not applicable  Risk to Others Homicidal Ideation: No Thoughts of Harm to Others: No Current Homicidal Intent: No Current Homicidal Plan: No Access to Homicidal Means: No Identified Victim: No one History of harm to others?: No Assessment of Violence: None Noted Violent Behavior Description: Pt tearful but cooperative Does patient have access to weapons?: No Criminal Charges Pending?: No Does patient have a court date: No  Psychosis Hallucinations: None noted Delusions: None  noted  Mental Status Report Appear/Hygiene:  (Casual) Eye Contact: Fair Motor Activity: Freedom of movement;Unremarkable Speech: Logical/coherent Level of Consciousness: Drowsy;Quiet/awake Mood: Depressed;Despair;Sad Affect: Blunted;Depressed Anxiety Level: Panic Attacks Panic attack frequency: 2-3x/W Most recent panic attack: Cannot recall Thought Processes: Coherent;Relevant Judgement: Unimpaired Orientation: Person;Time;Place;Situation Obsessive Compulsive Thoughts/Behaviors: None  Cognitive Functioning Concentration: Normal Memory: Recent Intact;Remote Intact IQ: Average Insight: Fair Impulse Control: Poor Appetite: Good Weight Loss: 0 Weight Gain:  (20 lbs in last year) Sleep: No Change Total Hours of Sleep: 8 Vegetative Symptoms: None  ADLScreening [facility] Assessment Services) Patient's cognitive ability adequate to safely complete daily activities?: Yes Patient able to express need for assistance with ADLs?: Yes Independently performs ADLs?: Yes (appropriate for developmental age)  Prior Inpatient Therapy Prior Inpatient Therapy: Yes Prior Therapy Dates: March & May 2014 Prior Therapy Facilty/Provider(s): [facility] Reason for Treatment: SI  Prior Outpatient Therapy Prior Outpatient Therapy: Yes Prior Therapy Dates: Little over a year Prior Therapy Facilty/Provider(s): Dr. Evelene Croon Reason for Treatment: med management  ADL Screening (condition at time of admission) Patient's cognitive ability adequate to safely complete daily activities?: Yes Is the patient  deaf or have difficulty hearing?: No Does the patient have difficulty seeing, even when wearing glasses/contacts?: No Does the patient have difficulty concentrating, remembering, or making decisions?: No Patient able to express need for assistance with ADLs?: Yes Does the patient have difficulty dressing or bathing?: No Independently performs ADLs?: Yes (appropriate for developmental age) Does the patient have  difficulty walking or climbing stairs?: No Weakness of Legs: None Weakness of Arms/Hands: None       Abuse/Neglect Assessment (Assessment to be complete while patient is alone) Physical Abuse: Denies Verbal Abuse: Denies Sexual Abuse: Denies Exploitation of patient/patient's resources: Denies Self-Neglect: Denies Values / Beliefs Cultural Requests During Hospitalization: None Spiritual Requests During Hospitalization: None   Advance Directives (For Healthcare) Advance Directive: Patient does not have advance directive;Patient would not like information    Additional Information 1:1 In Past 12 Months?: No CIRT Risk: No Elopement Risk: No Does patient have medical clearance?: Yes     Disposition:  Disposition Initial Assessment Completed for this Encounter: Yes Disposition of Patient: Inpatient treatment program;Referred to Type of inpatient treatment program: Adult Patient referred to:  (Accepted to [facility] by Donell Sievert, PA pending a 300 hall bed)  Beatriz Stallion Ray 06/22/2013 4:23 AM

## 2013-06-22 NOTE — ED Notes (Signed)
Home medication sent w/ pt to [facility] coutn confirmed w/ K.Tolleto RN

## 2013-06-22 NOTE — ED Notes (Signed)
Dr Ladona Ridgel into talk w/ pt and husband

## 2013-06-22 NOTE — ED Notes (Signed)
[facility] contacted for transport 

## 2013-06-22 NOTE — Tx Team (Signed)
Initial Interdisciplinary Treatment Plan  PATIENT STRENGTHS: (choose at least two) Average or above average intelligence Communication skills Supportive family/friends  PATIENT STRESSORS: Health problems   PROBLEM LIST: Problem List/Patient Goals Date to be addressed Date deferred Reason deferred Estimated date of resolution  Depression 06/22/2013                                                      DISCHARGE CRITERIA:  Ability to meet basic life and health needs Improved stabilization in mood, thinking, and/or behavior Motivation to continue treatment in a less acute level of care Verbal commitment to aftercare and medication compliance  PRELIMINARY DISCHARGE PLAN: Attend aftercare/continuing care group Attend PHP/IOP  PATIENT/FAMIILY INVOLVEMENT: This treatment plan has been presented to and reviewed with the patient, [name].  The patient and family have been given the opportunity to ask questions and make suggestions.  Marzetta Board E 06/22/2013, 5:46 PM

## 2013-06-22 NOTE — Progress Notes (Signed)
Per, [name] the patient may not be coming to this bed.  [name] will call back to confirm the bed assignment.

## 2013-06-22 NOTE — ED Notes (Addendum)
Dr Ladona Ridgel into talking w/ pt's husband

## 2013-06-22 NOTE — Progress Notes (Signed)
Writer consulted with Dr. Ladona Ridgel and the NP, Denice Bors) regarding the patient meeting criteria for inpatient hospitalization.   Writer informed the ER MD and the nurse working that the he patient has been accepted to Bed 504-2 at [facility] per  [facility]).

## 2013-06-22 NOTE — ED Notes (Signed)
Pt's husband into see 

## 2013-06-22 NOTE — ED Provider Notes (Signed)
Pt remains fully awake and alert. No resp depression. Stable to go to psych ed. Pt awaiting psych team assessment.   Suzi Roots, MD 06/22/13 [phone]

## 2013-06-22 NOTE — Progress Notes (Addendum)
Patient ID: [name], female   DOB: September 09, 1980, 32 y.o.   MRN: [id number]  Admission Note:  Patient is a 32 year old white female who came to [facility] voluntarily with SI and overdose. Patient presents with depressed mood and affect and becomes tearful throughout admission. Hand off report states patient took a handful of Xanax (57) and mixed it with shots of liquor. Patient reports that she tried to "OD on xanax." Patient states she has felt depressed for awhile and that was why she did it. Patient currently denies SI/HI and A/V hallucinations. Patient states that she had an appendectomy in 1994. Past medical history includes Scoliosis and a right foot injury.  Patient was introduced to the unit. Patient stated that she wanted to be on the 500 hall instead of the 300 hall at [facility]. Writer gave support and said to speak with the physician tomorrow about the options possible. Patient signed the 72 hour request for discharge form at 1600 on 06/22/2013. Patient verbalized understanding of the rules of the unit and the admission process. Patient denies pain at this time. Q15 minute safety checks were initiated and are being maintained. Patient is safe at this this time.

## 2013-06-22 NOTE — ED Notes (Signed)
Spoke with Patti from poison control and pt has been cleared.

## 2013-06-22 NOTE — ED Notes (Signed)
Up to the desk on the phone 

## 2013-06-22 NOTE — ED Provider Notes (Signed)
1:33 PM Pt accepted to [facility] under Dr. Dub Mikes. Will transfer. I did not directly participate in the care of this patient.   Junius Argyle, MD 06/22/13 [phone]

## 2013-06-22 NOTE — ED Notes (Signed)
Pt's husband in, waiting for Dr Ladona Ridgel to come and talk w./ them

## 2013-06-22 NOTE — ED Notes (Signed)
Per dispatch [facility] will transport

## 2013-06-22 NOTE — Progress Notes (Signed)
Per Minerva Areola the patient has been accepted to Bed 304-1.

## 2013-06-22 NOTE — ED Notes (Signed)
Dr Taylor and Shuvon NP into see 

## 2013-06-23 ENCOUNTER — Encounter (HOSPITAL_COMMUNITY): Payer: Self-pay | Admitting: Psychiatry

## 2013-06-23 DIAGNOSIS — R45851 Suicidal ideations: Secondary | ICD-10-CM

## 2013-06-23 DIAGNOSIS — F332 Major depressive disorder, recurrent severe without psychotic features: Secondary | ICD-10-CM | POA: Diagnosis present

## 2013-06-23 DIAGNOSIS — F4310 Post-traumatic stress disorder, unspecified: Secondary | ICD-10-CM | POA: Diagnosis present

## 2013-06-23 MED ORDER — OLANZAPINE-FLUOXETINE HCL 6-25 MG PO CAPS
1.0000 | ORAL_CAPSULE | Freq: Every day | ORAL | Status: DC
  Administered 2013-06-23 – 2013-06-27 (×5): 1 via ORAL
  Filled 2013-06-23 (×2): qty 1
  Filled 2013-06-23: qty 14
  Filled 2013-06-23 (×2): qty 1
  Filled 2013-06-23: qty 14
  Filled 2013-06-23 (×2): qty 1

## 2013-06-23 MED ORDER — ADULT MULTIVITAMIN W/MINERALS CH
1.0000 | ORAL_TABLET | Freq: Every day | ORAL | Status: DC
  Administered 2013-06-23 – 2013-06-28 (×6): 1 via ORAL
  Filled 2013-06-23 (×8): qty 1

## 2013-06-23 MED ORDER — NICOTINE 21 MG/24HR TD PT24
21.0000 mg | MEDICATED_PATCH | Freq: Every day | TRANSDERMAL | Status: DC
  Administered 2013-06-23 – 2013-06-27 (×5): 21 mg via TRANSDERMAL
  Filled 2013-06-23 (×10): qty 1

## 2013-06-23 MED ORDER — ARIPIPRAZOLE 2 MG PO TABS
2.0000 mg | ORAL_TABLET | Freq: Every day | ORAL | Status: DC
  Administered 2013-06-23 – 2013-06-26 (×4): 2 mg via ORAL
  Filled 2013-06-23 (×7): qty 1

## 2013-06-23 MED ORDER — IBUPROFEN 200 MG PO TABS
200.0000 mg | ORAL_TABLET | Freq: Four times a day (QID) | ORAL | Status: DC | PRN
  Administered 2013-06-24 – 2013-06-28 (×6): 200 mg via ORAL
  Filled 2013-06-23 (×6): qty 1

## 2013-06-23 MED ORDER — CHLORDIAZEPOXIDE HCL 25 MG PO CAPS
25.0000 mg | ORAL_CAPSULE | Freq: Four times a day (QID) | ORAL | Status: DC | PRN
  Administered 2013-06-23 – 2013-06-27 (×10): 25 mg via ORAL
  Filled 2013-06-23 (×10): qty 1

## 2013-06-23 NOTE — Progress Notes (Signed)
D) Pt rates her depression and hopelessness both at an 8. Admits to thoughts of SI today. States, "I feel very sad. I am back here again. I relapsed and feel bad"  A) Pt will contract for her safety while on the unit. Given support and reassurance. No judgements placed on Pt. Given support and reassurance. R) Pt's mood and affect are sad and depressed. Contracts for safety.

## 2013-06-23 NOTE — Progress Notes (Signed)
Nutrition Brief Note  Patient identified on the Malnutrition Screening Tool (MST) Report  Wt Readings from Last 5 Encounters:  06/22/13 193 lb (87.544 kg)  02/20/13 193 lb (87.544 kg)  02/07/13 192 lb 14.4 oz (87.499 kg)  01/04/13 188 lb (85.276 kg)  12/22/12 186 lb (84.369 kg)    Body mass index is 31.62 kg/(m^2). Patient meets criteria for class I obesity based on current BMI.   Discussed intake PTA with patient and compared to intake presently.  Discussed changes in intake, if any, and encouraged adequate intake of meals and snacks. Pt reports eating at least 2 meals/day at home. States she has been gaining weight recently due to her medications. Admitted with suicide attempt with shots of vodka and xanax. Reports eating well during admission. Healthy eating and exercise discussed.   Current diet order is regular and pt is also offered choice of unit snacks mid-morning and mid-afternoon.  Pt is eating as desired.   Labs and medications reviewed. ALT elevated.   Nutrition Dx:  Unintended wt change r/t medications causing weight gain AEB pt report  Interventions:   Discussed the importance of nutrition and encouraged intake of food and beverages.     Multivitamin 1 tablet PO daily    No additional nutrition interventions warranted at this time. If nutrition issues arise, please consult RD.   Levon Hedger MS, RD, LDN [phone] Pager [phone] After Hours Pager

## 2013-06-23 NOTE — Tx Team (Addendum)
  Interdisciplinary Treatment Plan Update   Date Reviewed:  06/23/2013  Time Reviewed:  9:37 AM  Progress in Treatment:   Attending groups: Yes Participating in groups: Yes Taking medication as prescribed: Yes  Tolerating medication: Yes Family/Significant other contact made: Yes  Patient understands diagnosis: Yes  As evidenced by asking for help with depression and thoughts of self harm Discussing patient identified problems/goals with staff: Yes  See initial care plan Medical problems stabilized or resolved: Yes Denies suicidal/homicidal ideation: Yes  In tx team Patient has not harmed self or others: Yes  For review of initial/current patient goals, please see plan of care.  Estimated Length of Stay:  3-5 days  Reason for Continuation of Hospitalization: Depression Medication stabilization  New Problems/Goals identified:  N/A  Discharge Plan or Barriers:   return home, follow up outpt  Additional Comments:  Patient is a 32 year old white female who came to [facility] voluntarily with SI and overdose. Patient presents with depressed mood and affect and becomes tearful throughout admission. Hand off report states patient took a handful of Xanax (57) and mixed it with shots of liquor. Patient reports that she tried to "OD on xanax." Patient states she has felt depressed for awhile and that was why she did it. Patient currently denies SI/HI and A/V hallucinations.    Attendees:  Signature: Geoffery Lyons, MD 06/23/2013 9:37 AM   Signature: Richelle Ito, LCSW 06/23/2013 9:37 AM  Signature:  06/23/2013 9:37 AM  Signature:  06/23/2013 9:37 AM  Signature: Lamount Cranker, RN 06/23/2013 9:37 AM  Signature:  06/23/2013 9:37 AM  Signature:   06/23/2013 9:37 AM  Signature:    Signature:    Signature:    Signature:    Signature:    Signature:      Scribe for Treatment Team:   Richelle Ito, LCSW  06/23/2013 9:37 AM

## 2013-06-23 NOTE — BHH Counselor (Signed)
Adult Psychosocial Assessment Update Interdisciplinary Team  Previous [facility] admissions/discharges:  Admissions Discharges  Date:12/22/12 Date:  Date:10/23/12 Date:    Date: Date:  Date: Date:  Date: Date:   Changes since the last Psychosocial Assessment (including adherence to outpatient mental health and/or substance abuse treatment, situational issues contributing to decompensation and/or relapse).  [name] stated that she attempted suicide on Thursday (06/22/13).  Her husband found her trying to overdose on medication and brought her to the ED.  She indicated having flashbacks of her sisters' deaths and bad dreams throughout the week.   She sought bereavement counseling in the past, but does not go there that often.   She was refered to [facility] when she left in May, but indicated that the co-pay cost was too expensive.  She is seeing Dr Evelene Croon for medication management , but only sees Dr. Evelene Croon once every month.  She indicated that her sisters' birthday is this month.               Discharge Plan 1. Will you be returning to the same living situation after discharge?   Yes:   No:      If no, what is your plan?    Yes, will return home with husband        2. Would you like a referral for services when you are discharged? Yes:     If yes, for what services?  No:       Yes, [facility]        Summary and Recommendations (to be completed by the evaluator) [name] is a 32 YO female who is here after a suicide attempt on 06/22/13.   She presented as flat, hopeless, and tearful.   She lost her mother last year and her twin sisters this year, one in February and the other in January.  She indicated upcoming holidays and her sisters' birthdays as triggers, since her family was very connected during those times.   She states that "I feel like I'm stuck and I don't know how to deal with this."   Is supported by her husband and best friend.  She  can benefit from crisis stabilization, therapeutic milieu, medication management, and referral for services.                          Signature:  Simona Huh, 06/23/2013 10:02 AM

## 2013-06-23 NOTE — Progress Notes (Signed)
Adult Psychoeducational Group Note  Date:  06/23/2013 Time:  2:38 PM  Group Topic/Focus:  Relapse Prevention Planning:   The focus of this group is to define relapse and discuss the need for planning to combat relapse.  Participation Level:  Active  Participation Quality:  Appropriate and Attentive  Affect:  Appropriate  Cognitive:  Appropriate  Insight: Good  Engagement in Group:  Engaged  Modes of Intervention:  Discussion, Education and Support  Additional Comments:  Pt attended and participated in group. She shared about regretting her actions of trying to kill herself, and leaving behind two children.  Reynolds Bowl 06/23/2013, 2:38 PM

## 2013-06-23 NOTE — BHH Group Notes (Signed)
Adult Psychoeducational Group Note  Date:  06/23/2013 Time:  10:31 PM  Group Topic/Focus:  AA Meeting  Participation Level:  Active  Participation Quality:  Appropriate  Affect:  Appropriate  Cognitive:  Appropriate  Insight: Appropriate  Engagement in Group:  Engaged  Modes of Intervention:  Discussion and Education  Additional Comments:  Niyah attended AA group.  Caroll Rancher A 06/23/2013, 10:31 PM

## 2013-06-23 NOTE — BHH Group Notes (Signed)
[facility] LCSW Group Therapy  06/23/2013 1:15 PM   Type of Therapy:  Group Therapy  Participation Level:  Did Not Attend  Reyes Ivan, LCSW 06/23/2013 2:41 PM

## 2013-06-23 NOTE — Progress Notes (Signed)
Pt observed resting in bed with eyes closed. RR WNL, even and unlabored. No acute distress. Level III obs in place for safety and pt remains safe. [name]  

## 2013-06-23 NOTE — BHH Group Notes (Signed)
[facility] LCSW Aftercare Discharge Planning Group Note   06/23/2013 8:45 AM  Participation Quality:  Alert and Appropriate   Mood/Affect:  Appropriate, Flat and Depressed  Depression Rating:  8  Anxiety Rating:  8  Thoughts of Suicide:  Pt denies SI/HI  Will you contract for safety?   Yes  Current AVH:  Pt denies  Plan for Discharge/Comments:  Pt attended discharge planning group and actively participated in group.  CSW provided pt with today's workbook.  Pt states that she came to the hospital after attempting suicide and depression.  Pt states that she lives in [place] and will return home there.  Pt states that she is followed by [facility] for medication management and therapy.  No further needs voiced by pt at this time.    Transportation Means: Pt reports access to transportation - husband will pick pt up  Supports: No supports mentioned at this time  Reyes Ivan, LCSW 06/23/2013 9:43 AM

## 2013-06-23 NOTE — H&P (Signed)
Psychiatric Admission Assessment Adult  Patient Identification:  [name] Date of Evaluation:  06/23/2013 Chief Complaint:  Depressive Disorder History of Present Illness:: 32 Y/O female who states she tried to commit suicide. Had 6 or 7 shots of Vodka, and took " a lot" of 1 mg Xanax.  (gets 120 and half a bottle was missing and she has gotten it 2 or 3 days before.) States has had a lot of losses two sisters died this year. Mother died las year. Her oldest son went to live with his biological father. Her 8 Y/O son is autistic. Drinking twice a week, past year, Vodka, beer. He has gotten increasingly more depressed. Got out of work went to a bar. Called her husband to tell him she was feeling like killing herself. States she told him to pick up their son, to go home and then do whatever she wanted to do. She states she was not intoxicated as has had only "two drinks" picked her son up, got home , drank some more took the OD. Husband found her in bed hardly breathing and called 911 Elements:  Location:  in patient. Quality:  unable to function. Severity:  severe. Timing:  every day. Duration:  building up last weeks. Context:  depression, multiple lossess, binging on alcohol waht impaired his judgemnt tried to kill herself. Associated Signs/Synptoms: Depression Symptoms:  depressed mood, anhedonia, fatigue, feelings of worthlessness/guilt, hopelessness, suicidal thoughts with specific plan, suicidal attempt, anxiety, panic attacks, loss of energy/fatigue, disturbed sleep, (Hypo) Manic Symptoms:  Irritable Mood, Labiality of Mood, Anxiety Symptoms:  Excessive Worry, Panic Symptoms, Psychotic Symptoms:  Denies PTSD Symptoms: Had a traumatic exposure:  saw sister die, tried to revive her, saw bother parents die Re-experiencing:  Flashbacks Intrusive Thoughts Nightmares  Psychiatric Specialty Exam: Physical Exam  Review of Systems  Eyes: Negative.   Respiratory: Positive  for cough.        Half a pack a day  Cardiovascular: Negative.   Gastrointestinal: Negative.   Genitourinary: Negative.   Musculoskeletal: Positive for back pain and joint pain.  Skin: Negative.   Neurological: Positive for weakness and headaches.  Endo/Heme/Allergies: Negative.   Psychiatric/Behavioral: Positive for depression and substance abuse. The patient is nervous/anxious.     Blood pressure 134/83, pulse 87, temperature 97.6 F (36.4 C), temperature source Oral, resp. rate 18, height 5' 5.5" (1.664 m), weight 87.544 kg (193 lb), last menstrual period 06/13/2013, SpO2 98.00%.Body mass index is 31.62 kg/(m^2).  General Appearance: Fairly Groomed  Patent attorney::  Fair  Speech:  Clear and Coherent, Slow and not spontaneous  Volume:  Decreased  Mood:  Depressed  Affect:  Blunt and Tearful  Thought Process:  Coherent, Goal Directed and not spontaneous  Orientation:  Full (Time, Place, and Person)  Thought Content:  worries, concerns, lossess, a sense of hopelessness, helplesness  Suicidal Thoughts:  Yes.  without intent/plan  Homicidal Thoughts:  No  Memory:  Immediate;   Fair Recent;   Fair Remote;   Fair  Judgement:  Fair  Insight:  Present  Psychomotor Activity:  Decreased  Concentration:  Poor  Recall:  Fair  Akathisia:  No  Handed:    AIMS (if indicated):     Assets:  Desire for Improvement Housing Talents/Skills  Sleep:       Past Psychiatric History: Diagnosis: Major Depression recurrent severe, PTSD, alcohol abuse  Hospitalizations: [facility] has been here twice before  Outpatient Care: Dr. Martyn Ehrich   Substance Abuse Care: Denies  Self-Mutilation:  yes  Suicidal Attempts: yes  Violent Behaviors: Denies   Past Medical History:   Past Medical History  Diagnosis Date  . Depression   . Abnormal Pap smear and cervical HPV (human papillomavirus)     CIN in 2005  . Tobacco abuse   . Scoliosis 11/12/2012  . Right foot injury 11/12/2012    Took place 10/27/2012     Allergies:  No Known Allergies PTA Medications: Prescriptions prior to admission  Medication Sig Dispense Refill  . ALPRAZolam (XANAX) 1 MG tablet Take 1 mg by mouth 4 (four) times daily as needed for anxiety.       . naproxen sodium (ANAPROX) 220 MG tablet Take 440 mg by mouth every 12 (twelve) hours as needed (headache).      . OLANZapine-FLUoxetine (SYMBYAX) 6-25 MG per capsule Take 1 capsule by mouth at bedtime.        Previous Psychotropic Medications:  Medication/Dose  Xanax, Symbyax    Prozac, Effexor, Zoloft, Citalopram...agitated  Risperdal, Depakote, Neurontin         Substance Abuse History in the last 12 months:  yes  Consequences of Substance Abuse: Legal Consequences:  2 DWI  Social History:  reports that she has been smoking Cigarettes.  She has a 9.5 pack-year smoking history. She has never used smokeless tobacco. She reports that she drinks about 1.2 ounces of alcohol per week. She reports that she uses illicit drugs (Marijuana). Additional Social History:                      Current Place of Residence:  Lives with her husband and son Place of Birth:   Family Members: Marital Status:  Married Children:  Sons:12 ( went to live with his father), 8 (autistic)  Daughters: Relationships: Education:  Designer, television/film set, Customer service manager Problems/Performance: Religious Beliefs/Practices: History of Abuse (Emotional/Phsycial/Sexual) Occupational Experiences; Civil Service fast streamer History:  None. Legal History: DWI (last 2007) Hobbies/Interests:  Family History:   Family History  Problem Relation Age of Onset  . Cancer Father     prostate  . Cancer Sister     bladder  . Lung disease Mother   . Cancer Maternal Uncle     Lung  . Cancer Paternal Aunt     Breast  . Cancer Maternal Grandfather   . Cancer Paternal Grandfather   . Cancer Paternal Aunt     Breast    Results for orders placed during the hospital encounter of 06/21/13  (from the past 72 hour(s))  ACETAMINOPHEN LEVEL     Status: None   Collection Time    06/21/13  8:38 PM      Result Value Range   Acetaminophen (Tylenol), Serum <15.0  10 - 30 ug/mL   Comment:            THERAPEUTIC CONCENTRATIONS VARY     SIGNIFICANTLY. A RANGE OF 10-30     ug/mL MAY BE AN EFFECTIVE     CONCENTRATION FOR MANY PATIENTS.     HOWEVER, SOME ARE BEST TREATED     AT CONCENTRATIONS OUTSIDE THIS     RANGE.     ACETAMINOPHEN CONCENTRATIONS     >150 ug/mL AT 4 HOURS AFTER     INGESTION AND >50 ug/mL AT 12     HOURS AFTER INGESTION ARE     OFTEN ASSOCIATED WITH TOXIC     REACTIONS.  CBC     Status: None   Collection Time    06/21/13  8:38 PM      Result Value Range   WBC 8.0  4.0 - 10.5 K/uL   RBC 4.28  3.87 - 5.11 MIL/uL   Hemoglobin 14.2  12.0 - 15.0 g/dL   HCT 40.9  81.1 - 91.4 %   MCV 93.2  78.0 - 100.0 fL   MCH 33.2  26.0 - 34.0 pg   MCHC 35.6  30.0 - 36.0 g/dL   RDW 78.2  95.6 - 21.3 %   Platelets 213  150 - 400 K/uL  COMPREHENSIVE METABOLIC PANEL     Status: Abnormal   Collection Time    06/21/13  8:38 PM      Result Value Range   Sodium 141  135 - 145 mEq/L   Potassium 3.7  3.5 - 5.1 mEq/L   Chloride 104  96 - 112 mEq/L   CO2 28  19 - 32 mEq/L   Glucose, Bld 100 (*) 70 - 99 mg/dL   BUN 8  6 - 23 mg/dL   Creatinine, Ser 0.86  0.50 - 1.10 mg/dL   Calcium 9.3  8.4 - 57.8 mg/dL   Total Protein 6.9  6.0 - 8.3 g/dL   Albumin 3.6  3.5 - 5.2 g/dL   AST 33  0 - 37 U/L   ALT 43 (*) 0 - 35 U/L   Alkaline Phosphatase 60  39 - 117 U/L   Total Bilirubin <0.1 (*) 0.3 - 1.2 mg/dL   GFR calc non Af Amer >90  >90 mL/min   GFR calc Af Amer >90  >90 mL/min   Comment: (NOTE)     The eGFR has been calculated using the CKD EPI equation.     This calculation has not been validated in all clinical situations.     eGFR's persistently <90 mL/min signify possible Chronic Kidney     Disease.  ETHANOL     Status: Abnormal   Collection Time    06/21/13  8:38 PM       Result Value Range   Alcohol, Ethyl (B) 75 (*) 0 - 11 mg/dL   Comment:            LOWEST DETECTABLE LIMIT FOR     SERUM ALCOHOL IS 11 mg/dL     FOR MEDICAL PURPOSES ONLY  SALICYLATE LEVEL     Status: Abnormal   Collection Time    06/21/13  8:38 PM      Result Value Range   Salicylate Lvl <2.0 (*) 2.8 - 20.0 mg/dL  URINE RAPID DRUG SCREEN (HOSP PERFORMED)     Status: Abnormal   Collection Time    06/22/13 12:14 AM      Result Value Range   Opiates NONE DETECTED  NONE DETECTED   Cocaine NONE DETECTED  NONE DETECTED   Benzodiazepines POSITIVE (*) NONE DETECTED   Amphetamines NONE DETECTED  NONE DETECTED   Tetrahydrocannabinol POSITIVE (*) NONE DETECTED   Barbiturates NONE DETECTED  NONE DETECTED   Comment:            DRUG SCREEN FOR MEDICAL PURPOSES     ONLY.  IF CONFIRMATION IS NEEDED     FOR ANY PURPOSE, NOTIFY LAB     WITHIN 5 DAYS.                LOWEST DETECTABLE LIMITS     FOR URINE DRUG SCREEN     Drug Class       Cutoff (ng/mL)     Amphetamine  1000     Barbiturate      200     Benzodiazepine   200     Tricyclics       300     Opiates          300     Cocaine          300     THC              50  POCT PREGNANCY, URINE     Status: None   Collection Time    06/22/13 12:28 AM      Result Value Range   Preg Test, Ur NEGATIVE  NEGATIVE   Comment:            THE SENSITIVITY OF THIS     METHODOLOGY IS >24 mIU/mL   Psychological Evaluations:  Assessment:   DSM5:  Schizophrenia Disorders:  none Obsessive-Compulsive Disorders:  none Trauma-Stressor Disorders:  Posttraumatic Stress Disorder (309.81) Substance/Addictive Disorders:  Alcohol Related Disorder - Moderate (303.90) Depressive Disorders:  Major Depressive Disorder - Severe (296.23)  AXIS I:  Substance Induced Mood Disorder AXIS II:  Deferred AXIS III:   Past Medical History  Diagnosis Date  . Depression   . Abnormal Pap smear and cervical HPV (human papillomavirus)     CIN in 2005  . Tobacco  abuse   . Scoliosis 11/12/2012  . Right foot injury 11/12/2012    Took place 10/27/2012   AXIS IV:  other psychosocial or environmental problems AXIS V:  41-50 serious symptoms  Treatment Plan/Recommendations:  Supportive approach/coping skills                                                                 Relapse prevention                                                                 CBT;mindfulness                                                                 Grief loss                                                                 Identify detox needs  Add Abilify to the Symbyax ( she has been very sensitive to antidepressants, seems that the Zyprexa in Symbyax is helping the tolerability of Prozac. Will need augmentation)  Treatment Plan Summary: Daily contact with patient to assess and evaluate symptoms and progress in treatment Medication management Current Medications:  Current Facility-Administered Medications  Medication Dose Route Frequency Provider Last Rate Last Dose  . alum & mag hydroxide-simeth (MAALOX/MYLANTA) 200-200-20 MG/5ML suspension 30 mL  30 mL Oral Q4H PRN Benjaman Pott, MD      . magnesium hydroxide (MILK OF MAGNESIA) suspension 30 mL  30 mL Oral Daily PRN Benjaman Pott, MD       Facility-Administered Medications Ordered in Other Encounters  Medication Dose Route Frequency Provider Last Rate Last Dose  . naproxen (NAPROSYN) tablet 375 mg  375 mg Oral TID WC Verne Spurr, PA-C        Observation Level/Precautions:  15 minute checks  Laboratory:  As per the ED  Psychotherapy:  Individual/group  Medications:  Continue Symbyax add Abilify, keep Librium on a PRN basis R/O withdrawal  Consultations:    Discharge Concerns:    Estimated LOS: 3-5 days  Other:     I certify that inpatient services furnished can reasonably be expected to improve the patient's condition.   Marthena Whitmyer  A 11/7/20148:16 AM

## 2013-06-23 NOTE — BHH Suicide Risk Assessment (Signed)
BHH INPATIENT:  Family/Significant Other Suicide Prevention Education  Suicide Prevention Education:  Education Completed; Husband, Myrtie Leuthold [phone] has been identified by the patient as the family member/significant other with whom the patient will be residing, and identified as the person(s) who will aid the patient in the event of a mental health crisis (suicidal ideations/suicide attempt).  With written consent from the patient, the family member/significant other has been provided the following suicide prevention education, prior to the and/or following the discharge of the patient.  The suicide prevention education provided includes the following:  Suicide risk factors  Suicide prevention and interventions  National Suicide Hotline telephone number  [facility] assessment telephone number  [facility] Emergency Assistance 911  [facility] and/or Residential Mobile Crisis Unit telephone number  Request made of family/significant other to:  Remove weapons (e.g., guns, rifles, knives), all items previously/currently identified as safety concern.    Remove drugs/medications (over-the-counter, prescriptions, illicit drugs), all items previously/currently identified as a safety concern.  The family member/significant other verbalizes understanding of the suicide prevention education information provided.  The family member/significant other agrees to remove the items of safety concern listed above.  Simona Huh 06/23/2013, 10:21 AM

## 2013-06-23 NOTE — BHH Suicide Risk Assessment (Signed)
Suicide Risk Assessment  Admission Assessment     Nursing information obtained from:    Demographic factors:    Current Mental Status:    Loss Factors:    Historical Factors:    Risk Reduction Factors:     CLINICAL FACTORS:   Severe Anxiety and/or Agitation Depression:   Comorbid alcohol abuse/dependence Impulsivity Severe Alcohol/Substance Abuse/Dependencies  COGNITIVE FEATURES THAT CONTRIBUTE TO RISK:  Closed-mindedness Polarized thinking Thought constriction (tunnel vision)    SUICIDE RISK:   Moderate:  Frequent suicidal ideation with limited intensity, and duration, some specificity in terms of plans, no associated intent, good self-control, limited dysphoria/symptomatology, some risk factors present, and identifiable protective factors, including available and accessible social support.  PLAN OF CARE: Supportive approach/coping skills/relapse prevention                               [id number]                               Address Detox needs                               Continue Symbyax add Abilify  I certify that inpatient services furnished can reasonably be expected to improve the patient's condition.  Udell Blasingame A 06/23/2013, 12:30 PM

## 2013-06-24 DIAGNOSIS — F332 Major depressive disorder, recurrent severe without psychotic features: Principal | ICD-10-CM

## 2013-06-24 DIAGNOSIS — F1010 Alcohol abuse, uncomplicated: Secondary | ICD-10-CM

## 2013-06-24 NOTE — BHH Group Notes (Signed)
B[facility] Group Notes:  (Nursing/MHT/Case Management/Adjunct)  Date:  06/24/2013  Time:  12:27 PM  Type of Therapy:  Psychoeducational Skills  Participation Level:  Did Not Attend   Hommer Cunliffe Shanta 06/24/2013, 12:27 PM 

## 2013-06-24 NOTE — BHH Group Notes (Signed)
BHH Group Notes:  (Clinical Social Work)  06/24/2013     1-2pm  Summary of Progress/Problems:   The main focus of today's process group was for the patient to identify ways in which they have in the past sabotaged their own recovery. Motivational Interviewing was utilized to ask the group members what they get out of their substance use, and what reasons they may have for wanting to change.  The Stages of Change were explained using a handout, and patients identified where they currently are with regard to stages of change.  The patient expressed that she self sabotages by denying her alcohol problems.  She feels she is ready to make the Decision to change.  Type of Therapy:  Group Therapy - Process   Participation Level:  Active  Participation Quality:  Attentive  Affect:  Blunted  Cognitive:  Oriented  Insight:  Developing/Improving  Engagement in Therapy:  Engaged  Modes of Intervention:  Education, Support and Processing, Motivational Interviewing  Ambrose Mantle, LCSW 06/24/2013, 12:16 PM

## 2013-06-24 NOTE — Progress Notes (Signed)
[facility] MD Progress Note  06/24/2013 5:07 PM [name]  MRN:  [id number] Subjective:  Patient has depressed mood and affect, sleep is "fair", appetite is "good", depression is improving according to the patient and wants to return home--some denial in regards to her severe depression but does admit her drinking has been too much and needs to go to AA.   Diagnosis:   DSM5:  Trauma-Stressor Disorders:  Posttraumatic Stress Disorder (309.81) Substance/Addictive Disorders:  Alcohol Related Disorder - Severe (303.90) and Alcohol Intoxication with Use Disorder - Severe (F10.229) Depressive Disorders:  Major Depressive Disorder - Severe (296.23)  Axis I: Alcohol Abuse, Anxiety Disorder NOS, Major Depression, Recurrent severe and Post Traumatic Stress Disorder Axis II: Deferred Axis III:  Past Medical History  Diagnosis Date  . Depression   . Abnormal Pap smear and cervical HPV (human papillomavirus)     CIN in 2005  . Tobacco abuse   . Scoliosis 11/12/2012  . Right foot injury 11/12/2012    Took place 10/27/2012   Axis IV: other psychosocial or environmental problems, problems related to social environment and problems with primary support group Axis V: 41-50 serious symptoms  ADL's:  Intact  Sleep: Fair  Appetite:  Good  Suicidal Ideation:  Denies  Homicidal Ideation:  Denies   Psychiatric Specialty Exam: Review of Systems  Constitutional: Negative.   HENT: Negative.   Eyes: Negative.   Respiratory: Negative.   Cardiovascular: Negative.   Gastrointestinal: Negative.   Genitourinary: Negative.   Musculoskeletal: Negative.   Skin: Negative.   Neurological: Negative.   Endo/Heme/Allergies: Negative.   Psychiatric/Behavioral: Positive for depression. The patient is nervous/anxious.     Blood pressure 162/87, pulse 82, temperature 97.9 F (36.6 C), temperature source Oral, resp. rate 16, height 5' 5.5" (1.664 m), weight 193 lb (87.544 kg), last menstrual period 06/13/2013,  SpO2 98.00%.Body mass index is 31.62 kg/(m^2).  General Appearance: Casual  Eye Contact::  Fair  Speech:  Normal Rate  Volume:  Normal  Mood:  Anxious and Depressed  Affect:  Congruent  Thought Process:  Coherent  Orientation:  Full (Time, Place, and Person)  Thought Content:  WDL  Suicidal Thoughts:  No  Homicidal Thoughts:  No  Memory:  Immediate;   Fair Recent;   Fair Remote;   Fair  Judgement:  Poor  Insight:  Lacking  Psychomotor Activity:  Decreased  Concentration:  Fair  Recall:  Fair  Akathisia:  No  Handed:  Right  AIMS (if indicated):     Assets:  Resilience  Sleep:  Number of Hours: 6.75   Current Medications: Current Facility-Administered Medications  Medication Dose Route Frequency Provider Last Rate Last Dose  . alum & mag hydroxide-simeth (MAALOX/MYLANTA) 200-200-20 MG/5ML suspension 30 mL  30 mL Oral Q4H PRN Benjaman Pott, MD   30 mL at 06/24/13 0657  . ARIPiprazole (ABILIFY) tablet 2 mg  2 mg Oral Daily Rachael Fee, MD   2 mg at 06/24/13 0836  . chlordiazePOXIDE (LIBRIUM) capsule 25 mg  25 mg Oral Q6H PRN Rachael Fee, MD   25 mg at 06/24/13 1625  . ibuprofen (ADVIL,MOTRIN) tablet 200 mg  200 mg Oral Q6H PRN Verne Spurr, PA-C   200 mg at 06/24/13 1435  . magnesium hydroxide (MILK OF MAGNESIA) suspension 30 mL  30 mL Oral Daily PRN Benjaman Pott, MD      . multivitamin with minerals tablet 1 tablet  1 tablet Oral Daily Lavena Bullion, RD  1 tablet at 06/24/13 0836  . nicotine (NICODERM CQ - dosed in mg/24 hours) patch 21 mg  21 mg Transdermal Q0600 Rachael Fee, MD   21 mg at 06/24/13 0915  . OLANZapine-FLUoxetine (SYMBYAX) 6-25 MG per capsule 1 capsule  1 capsule Oral QHS Rachael Fee, MD   1 capsule at 06/23/13 2122   Facility-Administered Medications Ordered in Other Encounters  Medication Dose Route Frequency Provider Last Rate Last Dose  . naproxen (NAPROSYN) tablet 375 mg  375 mg Oral TID WC Verne Spurr, PA-C        Lab Results: No  results found for this or any previous visit (from the past 48 hour(s)).  Physical Findings: AIMS: Facial and Oral Movements Muscles of Facial Expression: None, normal Lips and Perioral Area: None, normal Jaw: None, normal Tongue: None, normal,Extremity Movements Upper (arms, wrists, hands, fingers): None, normal Lower (legs, knees, ankles, toes): None, normal, Trunk Movements Neck, shoulders, hips: None, normal, Overall Severity Severity of abnormal movements (highest score from questions above): None, normal Incapacitation due to abnormal movements: None, normal Patient's awareness of abnormal movements (rate only patient's report): No Awareness, Dental Status Current problems with teeth and/or dentures?: No Does patient usually wear dentures?: No  CIWA:    COWS:     Treatment Plan Summary: Daily contact with patient to assess and evaluate symptoms and progress in treatment Medication management  Plan:  Review of chart, vital signs, medications, and notes. 1-Individual and group therapy 2-Medication management for depression, substance abuse, and anxiety:  Medications reviewed with the patient and she stated no adverse effects, no changes made 3-Coping skills for depression, anxiety, and alcohol abuse 4-Continue crisis stabilization and management 5-Address health issues--monitoring vital signs, stable 6-Treatment plan in progress to prevent relapse of depression, alcohol abuse, and anxiety  Medical Decision Making Problem Points:  Established problem, stable/improving (1) and Review of psycho-social stressors (1) Data Points:  Review of medication regiment & side effects (2)  I certify that inpatient services furnished can reasonably be expected to improve the patient's condition.   Nanine Means, PMH-NP 06/24/2013, 5:07 PM  Reviewed the information documented and agree with the treatment plan.  Joslin Doell,JANARDHAHA R. 06/25/2013 3:32 PM

## 2013-06-24 NOTE — Progress Notes (Signed)
Patient observed active on the unit. Patient denies SI, HI, AVH. Rates anxiety 2, depression 3. Contracts for safety. States "I'm feeling better now. I had a rough morning". Patient attended groups and hopes to get more out of the 500 hall groups tomorrow.  Patient had increased anxiety and was tearful after evening group. Patient stated "I need to work on myself".   Encouragement offered. Patient received evening medications as ordered.   Safety maintained. Q 15 minute checks continue.

## 2013-06-24 NOTE — [facility] Group Notes (Signed)
[facility] Group Notes:  (Clinical Social Work)  06/24/2013   3:00-4:00PM  (500 hall)(  Summary of Progress/Problems:   The main focus of today's process group was for the patient to identify ways in which they have sabotaged their own mental health wellness/recovery.  Motivational interviewing was used to explore the reasons they engage in this behavior, and reasons they may have for wanting to change.  The Stages of Change were explained to the group using a handout, and patients identified where they are with regard to changing self-defeating behaviors.  The patient was in this group earlier on 300 hall, sat through again on 500.  She expressed much more than earlier, and was appreciated by other patients verbally for sharing so openly.  She stated she drinks in self-sabotage because it puts her into a whole different world where she does not have to handle her emotional pain.  She tried to commit suicide after losing her mother and both sisters in a short period of time, and is now ashamed because her husband cannot stop crying and she realizes what she has done to him.  She is willing to go to grief counseling, needs same.  She stated she is in Preparation Stage of Change.  Type of Therapy:  Process Group  Participation Level:  Active  Participation Quality:  Attentive, Sharing and Supportive  Affect:  Blunted and Depressed  Cognitive:  Oriented  Insight:  Engaged  Engagement in Therapy:  Engaged  Modes of Intervention:  Education, Motivational Interviewing   Ambrose Mantle, LCSW 06/24/2013, 4:00pm

## 2013-06-24 NOTE — Progress Notes (Signed)
Spoke with pt 1:1 who is anxious and depressed both in affect and mood. Requesting prn librium though it is too early. Also complaining of constipation and bilat knee pain. Medicated per orders. Support and reassurance offered. Denies SI/HI/AVH and remains safe. Lawrence Marseilles

## 2013-06-24 NOTE — Progress Notes (Signed)
D-Patient is out in milieu attending 500 hall groups and interacting with peers.  Rates depression and hopelessness at 7.  Energy is "low" with "fair" sleep.  States she will resume grief processing at Hospice at d/c.  Reported "knee pain" on PI but did not report to this Clinical research associate.  A-Support and encouragement given.  Requested and received prn librium for anxiety.  Continue current POC and evaluation of treatment goals.  Continue 15' checks for safety.

## 2013-06-25 NOTE — Progress Notes (Signed)
D: Pt denies SI/HI/AVH. Pt is pleasant and cooperative. Pt complained of knee pain, and was given Advil and it helped.  A: Pt was offered support and encouragement. Pt was given scheduled medications. Pt was encourage to attend groups. Q 15 minute checks were done for safety.   R:Pt attends groups and interacts well with peers and staff. Pt is taking medication.Pt receptive to treatment and safety maintained on unit.

## 2013-06-25 NOTE — [facility] Group Notes (Signed)
[facility] Group Notes:  (Clinical Social Work)  06/25/2013  10:00-11:00AM  Summary of Progress/Problems:   The main focus of today's process group was to   identify the patient's current support system and decide on other supports that can be put in place.  The picture on workbook was used to discuss why additional supports are needed, and a hand-out was distributed with four definitions/levels of support, then used to talk about how patients have given and received all different kinds of support.  An emphasis was placed on using counselor, doctor, therapy groups, 12-step groups, and problem-specific support groups to expand supports.  The patient identified one additional support as being her husband.  She listened, helped with activities but did not speak much.  Acknowledged that she has not rescinded her 72-hour notice and wants to be discharged this afternoon.  Type of Therapy:  Process Group with Motivational Interviewing  Participation Level:  Active  Participation Quality:  Attentive  Affect:  Blunted  Cognitive:  Oriented  Insight:  Engaged  Engagement in Therapy:  Engaged  Modes of Intervention:   Education, Support and Processing, Activity  [employer], LCSW 06/25/2013, 12:15pm

## 2013-06-25 NOTE — Progress Notes (Signed)
[facility] Group Notes:  (Nursing/MHT/Case Management/Adjunct)  Date:  06/24/2013 Time:  8:00 p.m.   Type of Therapy:  Psychoeducational Skills  Participation Level:  Active  Participation Quality:  Appropriate  Affect:  Appropriate  Cognitive:  Appropriate  Insight:  Appropriate  Engagement in Group:  Developing/Improving  Modes of Intervention:  Education  Summary of Progress/Problems: The patient described her day as having been "up and down". On a positive note, she stated that she had a good visit with her husband. She then went on to discuss that she has been dealing with quit a bit of shame today. The patient indicated that her husband while visiting talked to her about a number of things that she committed before entering the hospital. She does not recall a number of these incidents and is ashamed that she behaved in that manner. As a theme of the day, her relapse prevention techniques include going out and receiving help for her substance abuse addiction.   Hazle Coca S 06/25/2013, 12:36 AM

## 2013-06-25 NOTE — [facility] Group Notes (Signed)
[facility] Group Notes:  (Nursing/MHT/Case Management/Adjunct)  Date:  06/25/2013  Time:  2:18 PM  Type of Therapy:  Nurse Education  Participation Level:  Active  Participation Quality:  Appropriate  Affect:  Appropriate  Cognitive:  Alert and Appropriate  Insight:  Improving  Engagement in Group:  Developing/Improving  Modes of Intervention:  Education  Summary of Progress/Problems:  [name] 06/25/2013, 2:18 PM

## 2013-06-25 NOTE — Progress Notes (Signed)
D-Patient i sad and tearful this shift.  States "I feel like I need to be here another day."  Recinded 72 hour request for d/c.  Requested and received prn meds for anxiety.  Rates depression at 5 and hopelessness at 1.  Denies SI.  A- Support and encouragement offered.  Continue POC and evaluation of treatment goals.  Continue 15' checks for safety. R- Safety maintained.

## 2013-06-25 NOTE — Progress Notes (Signed)
Patient ID: [name], female   DOB: [birth year], 31 y.o.   MRN: [id number] [facility] MD Progress Note  06/25/2013 12:34 PM [name]  MRN:  [id number] Subjective:  Patient has rescinded her 72 hour request for discharge. She states she is better because she wants to live, wants to feel less sad, does feel less sad! She has been able to distract herself from her problems by playing a board game and this has helped. Feels that the Abilify has helped.  She does note some anxiety but is very interested in attending AA meetings for support. She wanted to discharge out today, but became overwhelmed with anxiety and asked to stay for today.  Diagnosis:   DSM5:  Trauma-Stressor Disorders:  Posttraumatic Stress Disorder (309.81) Substance/Addictive Disorders:  Alcohol Related Disorder - Severe (303.90) and Alcohol Intoxication with Use Disorder - Severe (F10.229) Depressive Disorders:  Major Depressive Disorder - Severe (296.23)  Axis I: Alcohol Abuse, Anxiety Disorder NOS, Major Depression, Recurrent severe and Post Traumatic Stress Disorder Axis II: Deferred Axis III:  Past Medical History  Diagnosis Date  . Depression   . Abnormal Pap smear and cervical HPV (human papillomavirus)     CIN in 2005  . Tobacco abuse   . Scoliosis 11/12/2012  . Right foot injury 11/12/2012    Took place 10/27/2012   Axis IV: other psychosocial or environmental problems, problems related to social environment and problems with primary support group Axis V: 41-50 serious symptoms  ADL's:  Intact  Sleep: Fair  Appetite:  Good  Suicidal Ideation:  Denies  Homicidal Ideation:  Denies   Psychiatric Specialty Exam: Review of Systems  Constitutional: Negative.   HENT: Negative.   Eyes: Negative.   Respiratory: Negative.   Cardiovascular: Negative.   Gastrointestinal: Negative.   Genitourinary: Negative.   Musculoskeletal: Negative.   Skin: Negative.   Neurological: Negative.   Endo/Heme/Allergies:  Negative.   Psychiatric/Behavioral: Positive for depression. The patient is nervous/anxious.     Blood pressure 124/81, pulse 98, temperature 98.2 F (36.8 C), temperature source Oral, resp. rate 16, height 5' 5.5" (1.664 m), weight 87.544 kg (193 lb), last menstrual period 06/13/2013, SpO2 98.00%.Body mass index is 31.62 kg/(m^2).  General Appearance: Casual  Eye Contact::  Fair  Speech:  Normal Rate  Volume:  Normal  Mood:  Anxious and Depressed  Affect:  Congruent  Thought Process:  Coherent  Orientation:  Full (Time, Place, and Person)  Thought Content:  WDL  Suicidal Thoughts:  No  Homicidal Thoughts:  No  Memory:  Immediate;   Fair Recent;   Fair Remote;   Fair  Judgement:  Poor  Insight:  Lacking  Psychomotor Activity:  Decreased  Concentration:  Fair  Recall:  Fair  Akathisia:  No  Handed:  Right  AIMS (if indicated):     Assets:  Resilience  Sleep:  Number of Hours: 6.75   Current Medications: Current Facility-Administered Medications  Medication Dose Route Frequency Provider Last Rate Last Dose  . alum & mag hydroxide-simeth (MAALOX/MYLANTA) 200-200-20 MG/5ML suspension 30 mL  30 mL Oral Q4H PRN Benjaman Pott, MD   30 mL at 06/24/13 0657  . ARIPiprazole (ABILIFY) tablet 2 mg  2 mg Oral Daily Rachael Fee, MD   2 mg at 06/25/13 0813  . chlordiazePOXIDE (LIBRIUM) capsule 25 mg  25 mg Oral Q6H PRN Rachael Fee, MD   25 mg at 06/25/13 1141  . ibuprofen (ADVIL,MOTRIN) tablet 200 mg  200 mg  Oral Q6H PRN Verne Spurr, PA-C   200 mg at 06/24/13 2220  . magnesium hydroxide (MILK OF MAGNESIA) suspension 30 mL  30 mL Oral Daily PRN Benjaman Pott, MD   30 mL at 06/24/13 2121  . multivitamin with minerals tablet 1 tablet  1 tablet Oral Daily Lavena Bullion, RD   1 tablet at 06/25/13 0813  . nicotine (NICODERM CQ - dosed in mg/24 hours) patch 21 mg  21 mg Transdermal Q0600 Rachael Fee, MD   21 mg at 06/25/13 9562  . OLANZapine-FLUoxetine (SYMBYAX) 6-25 MG per capsule 1  capsule  1 capsule Oral QHS Rachael Fee, MD   1 capsule at 06/24/13 2122   Facility-Administered Medications Ordered in Other Encounters  Medication Dose Route Frequency Provider Last Rate Last Dose  . naproxen (NAPROSYN) tablet 375 mg  375 mg Oral TID WC Verne Spurr, PA-C        Lab Results: No results found for this or any previous visit (from the past 48 hour(s)).  Physical Findings: AIMS: Facial and Oral Movements Muscles of Facial Expression: None, normal Lips and Perioral Area: None, normal Jaw: None, normal Tongue: None, normal,Extremity Movements Upper (arms, wrists, hands, fingers): None, normal Lower (legs, knees, ankles, toes): None, normal, Trunk Movements Neck, shoulders, hips: None, normal, Overall Severity Severity of abnormal movements (highest score from questions above): None, normal Incapacitation due to abnormal movements: None, normal Patient's awareness of abnormal movements (rate only patient's report): No Awareness, Dental Status Current problems with teeth and/or dentures?: No Does patient usually wear dentures?: No  CIWA:    COWS:     Treatment Plan Summary: Daily contact with patient to assess and evaluate symptoms and progress in treatment Medication management  Plan:  Review of chart, vital signs, medications, and notes. 1-Individual and group therapy 2-Medication management for depression, substance abuse, and anxiety:  Medications reviewed with the patient and she stated no adverse effects, no changes made 3-Coping skills for depression, anxiety, and alcohol abuse 4-Continue crisis stabilization and management 5-Address health issues--monitoring vital signs, stable 6-Treatment plan in progress to prevent relapse of depression, alcohol abuse, and anxiety 7. May want to consider increasing Symbiax to higher dose instead of adding a second antipsychotic. Medical Decision Making Problem Points:  Established problem, stable/improving (1) and Review  of psycho-social stressors (1) Data Points:  Review of medication regiment & side effects (2)  I certify that inpatient services furnished can reasonably be expected to improve the patient's condition.  Rona Ravens. [place] RPAC 4:13 PM 06/25/2013  Reviewed the information documented and agree with the treatment plan.  Brealynn Contino,JANARDHAHA R. 06/26/2013 12:50 PM

## 2013-06-26 DIAGNOSIS — F322 Major depressive disorder, single episode, severe without psychotic features: Secondary | ICD-10-CM

## 2013-06-26 DIAGNOSIS — F1220 Cannabis dependence, uncomplicated: Secondary | ICD-10-CM

## 2013-06-26 DIAGNOSIS — F1020 Alcohol dependence, uncomplicated: Secondary | ICD-10-CM

## 2013-06-26 DIAGNOSIS — F4310 Post-traumatic stress disorder, unspecified: Secondary | ICD-10-CM

## 2013-06-26 DIAGNOSIS — F411 Generalized anxiety disorder: Secondary | ICD-10-CM

## 2013-06-26 MED ORDER — ARIPIPRAZOLE 5 MG PO TABS
5.0000 mg | ORAL_TABLET | Freq: Every day | ORAL | Status: DC
  Administered 2013-06-27 – 2013-06-28 (×2): 5 mg via ORAL
  Filled 2013-06-26 (×3): qty 1
  Filled 2013-06-26: qty 14
  Filled 2013-06-26: qty 1
  Filled 2013-06-26: qty 14

## 2013-06-26 MED ORDER — TRAZODONE HCL 50 MG PO TABS
50.0000 mg | ORAL_TABLET | Freq: Every evening | ORAL | Status: DC | PRN
  Administered 2013-06-26 – 2013-06-27 (×2): 50 mg via ORAL
  Filled 2013-06-26: qty 1
  Filled 2013-06-26: qty 28
  Filled 2013-06-26: qty 1

## 2013-06-26 NOTE — [facility] Group Notes (Signed)
[facility] LCSW Aftercare Discharge Planning Group Note   06/26/2013 10:46 AM  Participation Quality:  Appropriate   Mood/Affect:  Appropriate  Depression Rating:  3  Anxiety Rating:  2  Thoughts of Suicide:  No Will you contract for safety?   NA  Current AVH:  No  Plan for Discharge/Comments:  Pt reports that she feels ready to d/c today and has follow-up in place with Dr. Evelene Croon. She also plans to pursue therapy (bereavment counseling) through [facility] when she "gets settled" at home. Pt reports that she has difficulty sleeping that the hospital and feels groggy today.   Transportation Means: husband or friend  Supports: strong social support/friends and Production assistant, radio, Research scientist (physical sciences)

## 2013-06-26 NOTE — Progress Notes (Signed)
[facility] Adult Case Management Discharge Plan :  Will you be returning to the same living situation after discharge: Yes,  home with family At discharge, do you have transportation home?:Yes,  husband or friend Do you have the ability to pay for your medications:Yes,  [employer] private insurance  Release of information consent forms completed and in the chart;  Patient's signature needed at discharge.  Patient to Follow up at: Follow-up Information   Follow up with [facility]  On 08/01/2013. (At 12:15PM.  See Dr. Evelene Croon.  Please call if you need an earlier appointment date.  )    Contact information:   [address] [place] [phone]      Patient denies SI/HI:   Yes,  during group and self report.    Safety Planning and Suicide Prevention discussed:  Yes,  SPE completed with pt's husband. SPI pamphlet provided to pt and she was encouraged to share this information with her support network.   Smart, [name] 06/26/2013, 10:49 AM

## 2013-06-26 NOTE — Progress Notes (Signed)
Psychoeducational Group Note  Date:  06/26/2013 Time: 1100  Group Topic/Focus:  Self Care:   The focus of this group is to help patients understand the importance of self-care in order to improve or restore emotional, physical, spiritual, interpersonal, and financial health.  Participation Level: Did Not Attend  Participation Quality:  Not Applicable  Affect:  Not Applicable  Cognitive:  Not Applicable  Insight:  Not Applicable  Engagement in Group: Not Applicable  Additional Comments:  Patient did not attend group, patient remained in her room.  Karleen Hampshire Brittini 06/26/2013, 6:52 PM

## 2013-06-26 NOTE — Progress Notes (Signed)
Patient did attend the evening speaker AA meeting.  

## 2013-06-26 NOTE — Progress Notes (Addendum)
Pt attended spiritual care group on grief and loss facilitated by chaplain Burnis Kingfisher.  Group opened with brief discussion and psycho-social ed around grief and loss in relationships and in relation to self - identifying life patterns, circumstances, changes that cause losses. Established group norm of speaking from own life experience. Group goal of establishing open and affirming space for members to share loss and experience with grief, normalize grief experience and provide psycho social education and grief support.  [name] spoke with group about the loss of her parents and two sisters.  Also described grief around losing her 21 year old son, who has decided he would like to live with his father.  [name] described feeling of fear, isolation, guilt, overwhelmed / abnormality, uncertainty about next steps around sister's death.  Chaplain and group normalized feelings and [name] received support from group members who described their own lengthy walk with grief / loss.  Pt expressed resonance with group member who spoke of using substances to cope with grief.  Encouraged by pt's story to explore longer term treatment.   Belva Crome MDiv

## 2013-06-26 NOTE — Progress Notes (Signed)
Pt reports she is feeling a little better since she came to terms with her feelings and admitted that she has a problem with drinking.  She intends to start going to Hospice for grief counseling, as well as trying to get into a long term program for her alcohol abuse.  Pt denies SI/HI/AV.  She requests something for anxiety which was given along with Trazodone for sleep.  Pt voices no other needs at this time.  Support and encouragement offered.  Safety maintained with q15 minute checks.

## 2013-06-26 NOTE — [facility] Group Notes (Signed)
[facility] LCSW Group Therapy  06/26/2013 3:30 PM  Type of Therapy:  Group Therapy  Participation Level:  Active  Participation Quality:  Attentive  Affect:  Appropriate  Cognitive:  Alert and Oriented  Insight:  Engaged  Engagement in Therapy:  Engaged  Modes of Intervention:  Discussion, Education, Exploration, Socialization and Support  Summary of Progress/Problems: Today's Topic: Overcoming Obstacles. Pt identified obstacles faced currently and processed barriers involved in overcoming these obstacles. Pt identified steps necessary for overcoming these obstacles and explored motivation (internal and external) for facing these difficulties head on. Pt further identified one area of concern in their lives and chose a skill of focus pulled from their "toolbox." [name] was engaged and attentive throughout today's therapy group. She stated that her biggest obstacle involves going back to work. When asked to explore this further, [name] explained that her job is next door to a bar. "I have a hard time resisting the temptation to go there after work." [name] and others were asked to brainstorm ways for her to overcome this obstacle. [name] shows progress in the group setting AEB her ability to identify ways to combat this temptation and be accepting of advice from others within the group setting. "I can ask my husband to pick me up from work or have a friend take me home each day and support me in that way. I also plan to start going to AA meetings again to increase my social network."    Smart, Bethany 06/26/2013, 3:30 PM

## 2013-06-26 NOTE — ED Provider Notes (Signed)
EKG Interpretation   None        Derwood Kaplan, MD 06/26/13 2314

## 2013-06-26 NOTE — Progress Notes (Signed)
The focus of this group is to help patients review their daily goal of treatment and discuss progress on daily workbooks. Pt attended the evening group session and responded to discussion prompts from the Writer. Pt reported having had a good day on the unit, the highlight of which was being able to speak with her young sons on the phone and tell them why she was in the hospital. Pt became tearful when discussing this, but reiterated that it was positive she was able to speak with them. Pt reported having no additional needs from Nursing Staff this evening beyond prayer. Pt's affect was appropriate.

## 2013-06-26 NOTE — Progress Notes (Signed)
D:  Patient up and present in the milieu.  Has been attending groups on the 500 hallway.  Came to me after the Grief and Loss group tearful and asking to speak with her case manager.  When questioned, she stated she would like information about going into long term treatment from here.  Chaplain who was leading the group was able to shed a bit more light on the issue stating that she really opened up about her alcohol use during the group.  She did not complete a self inventory today, but is denying suicidal ideation at this time.  She appears sad/depressed and complains of anxiety symptoms.   A:  Called and left a message with Chartered loss adjuster, Child psychotherapist.  Medications given as prescribed.  Encouraged participation in groups.  Encouraged her to explore options with [facility] regarding discharge plans.   R:  Cooperative with staff.  Tearful at times, but showing insight.  Interacting well with peers.  Tolerating medications.  Safety is maintained.

## 2013-06-26 NOTE — Clinical Social Work Note (Signed)
Pt is not d/cing today after speaking with MD. She is now interested in i/p treatment. CSW reviewed [facility], [facility], and [name]-pt made aware there would be deduct able/likely out of pocket pay. Pt stated that if she does not get into [facility] tomorrow and/or is unable to pay deduct able, she would like to go home and follow-up o/p as planned. [name] referral sent by CSW at 2:20PM.

## 2013-06-26 NOTE — Progress Notes (Signed)
[facility] MD Progress Note  06/26/2013 2:54 PM [name]  MRN:  [id number] Subjective:  Feeling very overwhelmed. She is finding out more things that she did  before she came here. States she was told by her husband that her son saw some of this behavior. Very sad, regretful, feeling ashamed, guilty. She states she does not feel she will be able to make it if she was to relapse and get to feel this way again. States she feels she needs to go to rehab. She has not been able to sleep. Still very depressed, tearful. Went to the grief-loss group and this help to bring some of the feelings out.  Diagnosis:   DSM5: Schizophrenia Disorders:  none Obsessive-Compulsive Disorders:  none Trauma-Stressor Disorders:  Posttraumatic Stress Disorder (309.81) Substance/Addictive Disorders:  Alcohol Related Disorder - Moderate (303.90) and Cannabis Use Disorder - Moderate 9304.30) Depressive Disorders:  Major Depressive Disorder - Severe (296.23)  Axis I: Anxiety Disorder NOS  ADL's:  Intact  Sleep: Poor  Appetite:  Fair  Suicidal Ideation:  Plan:  denies Intent:  denies Means:  denies Homicidal Ideation:  Plan:  denies Intent:  denies Means:  denies AEB (as evidenced by):  Psychiatric Specialty Exam: Review of Systems  Constitutional: Negative.   HENT: Negative.   Eyes: Negative.   Respiratory: Negative.   Cardiovascular: Negative.   Gastrointestinal: Negative.   Genitourinary: Negative.   Musculoskeletal: Negative.   Skin: Negative.   Neurological: Negative.   Endo/Heme/Allergies: Negative.   Psychiatric/Behavioral: Positive for depression and substance abuse. The patient is nervous/anxious and has insomnia.     Blood pressure 117/82, pulse 86, temperature 97.9 F (36.6 C), temperature source Oral, resp. rate 16, height 5' 5.5" (1.664 m), weight 87.544 kg (193 lb), last menstrual period 06/13/2013, SpO2 98.00%.Body mass index is 31.62 kg/(m^2).  General Appearance: Fairly Groomed   Patent attorney::  Fair  Speech:  Clear and Coherent and Slow  Volume:  Decreased  Mood:  Depressed and Worthless  Affect:  Restricted and Tearful  Thought Process:  Coherent and Goal Directed  Orientation:  Full (Time, Place, and Person)  Thought Content:  worries, concerns, regrets, guilt  Suicidal Thoughts:  No  Homicidal Thoughts:  No  Memory:  Immediate;   Fair Recent;   Fair Remote;   Fair  Judgement:  Fair  Insight:  Present  Psychomotor Activity:  Restlessness  Concentration:  Fair  Recall:  Fair  Akathisia:  No  Handed:    AIMS (if indicated):     Assets:  Desire for Improvement Housing Social Support  Sleep:  Number of Hours: 6   Current Medications: Current Facility-Administered Medications  Medication Dose Route Frequency Provider Last Rate Last Dose  . alum & mag hydroxide-simeth (MAALOX/MYLANTA) 200-200-20 MG/5ML suspension 30 mL  30 mL Oral Q4H PRN Benjaman Pott, MD   30 mL at 06/24/13 0657  . ARIPiprazole (ABILIFY) tablet 2 mg  2 mg Oral Daily Rachael Fee, MD   2 mg at 06/26/13 0809  . chlordiazePOXIDE (LIBRIUM) capsule 25 mg  25 mg Oral Q6H PRN Rachael Fee, MD   25 mg at 06/26/13 1210  . ibuprofen (ADVIL,MOTRIN) tablet 200 mg  200 mg Oral Q6H PRN Verne Spurr, PA-C   200 mg at 06/26/13 1210  . magnesium hydroxide (MILK OF MAGNESIA) suspension 30 mL  30 mL Oral Daily PRN Benjaman Pott, MD   30 mL at 06/24/13 2121  . multivitamin with minerals tablet 1 tablet  1 tablet Oral Daily Lavena Bullion, RD   1 tablet at 06/26/13 0809  . nicotine (NICODERM CQ - dosed in mg/24 hours) patch 21 mg  21 mg Transdermal Q0600 Rachael Fee, MD   21 mg at 06/26/13 0809  . OLANZapine-FLUoxetine (SYMBYAX) 6-25 MG per capsule 1 capsule  1 capsule Oral QHS Rachael Fee, MD   1 capsule at 06/25/13 2206   Facility-Administered Medications Ordered in Other Encounters  Medication Dose Route Frequency Provider Last Rate Last Dose  . naproxen (NAPROSYN) tablet 375 mg  375 mg Oral  TID WC Verne Spurr, PA-C        Lab Results: No results found for this or any previous visit (from the past 48 hour(s)).  Physical Findings: AIMS: Facial and Oral Movements Muscles of Facial Expression: None, normal Lips and Perioral Area: None, normal Jaw: None, normal Tongue: None, normal,Extremity Movements Upper (arms, wrists, hands, fingers): None, normal Lower (legs, knees, ankles, toes): None, normal, Trunk Movements Neck, shoulders, hips: None, normal, Overall Severity Severity of abnormal movements (highest score from questions above): None, normal Incapacitation due to abnormal movements: None, normal Patient's awareness of abnormal movements (rate only patient's report): No Awareness, Dental Status Current problems with teeth and/or dentures?: No Does patient usually wear dentures?: No  CIWA:    COWS:     Treatment Plan Summary: Daily contact with patient to assess and evaluate symptoms and progress in treatment Medication management  Plan: Supportive approach/coping skills/relapse prevention           Will continue the Abilify and increase to 5 mg looking to augment the Symbyax           Will use Trazone for sleep           CBT; mindfulness               Medical Decision Making Problem Points:  Review of psycho-social stressors (1) Data Points:  Review of medication regiment & side effects (2) Review of new medications or change in dosage (2)  I certify that inpatient services furnished can reasonably be expected to improve the patient's condition.   Marsella Suman A 06/26/2013, 2:54 PM

## 2013-06-27 DIAGNOSIS — F1994 Other psychoactive substance use, unspecified with psychoactive substance-induced mood disorder: Secondary | ICD-10-CM

## 2013-06-27 NOTE — Progress Notes (Signed)
Brief follow up with pt re: discharge plans.  Pt fearful and anxious around being able to cope with grief without substance use.   Motivated to pursue grief counseling at hospice after discharge from [facility].   Belva Crome MDiv

## 2013-06-27 NOTE — Progress Notes (Signed)
Patient ID: [name], female   DOB: [birth year], 32 y.o.   MRN: [id number]  D: Patient pleasant on approach today. Gives depression "5" on scale and hopelessness "1". Denies any SI this am. Has some anxiety about going into long term treatment. Wants to go to AA and hospice after discharge. A: Staff will monitor on q 15 minute checks, follow treatment plan, and give meds as ordered. R: Cooperative on the unit.

## 2013-06-27 NOTE — Progress Notes (Signed)
Recreation Therapy Notes  Date: 11.11.2014 Time: 2:30pm Location: [address] Hall Dayroom  Group Topic: Software engineer Activities (AAA)  Behavioral Response: Engaged, Attentive, Appropriate  Affect: Euthymic  Clinical Observations/Feedback: Dog Team: [employer]. Patient interacted appropriately with peer, dog team, LRT and MHT.   Marykay Lex Jameire Kouba, LRT/CTRS  Jearl Klinefelter 06/27/2013 5:13 PM

## 2013-06-27 NOTE — [facility] Group Notes (Signed)
[facility] Group Notes:  (Nursing/MHT/Case Management/Adjunct)  Date:  06/27/2013  Time:  1:56 PM  Type of Therapy:  Nurse Education  Participation Level:  Active  Participation Quality:  Appropriate and Attentive  Affect:  Appropriate  Cognitive:  Alert and Appropriate  Insight:  Good  Engagement in Group:  Supportive  Modes of Intervention:  Activity, Discussion and Support  Summary of Progress/Problems: Patient contributing to discussion this am. Supportive to other peers.  Manuela Schwartz [facility] 06/27/2013, 1:56 PM

## 2013-06-27 NOTE — Progress Notes (Signed)
Pt reports she is doing better this evening and is looking for to the next step of her treatment.  Pt is going to [facility] for further treatment for her alcohol addiction.  Pt denies SI/HI/AV.  Pt says she is ready to discharge, although she is understandably anxious.  Pt has been participating in unit activities and groups.  Pt makes her needs known to staff.  Support and encouragement offered.  Safety maintained with q15 minute checks.

## 2013-06-27 NOTE — [facility] Group Notes (Signed)
[facility] LCSW Group Therapy  06/27/2013 2:41 PM  Type of Therapy:  Group Therapy  Participation Level:  Active  Participation Quality:  Attentive  Affect:  Appropriate  Cognitive:  Alert and Oriented  Insight:  Engaged  Engagement in Therapy:  Engaged  Modes of Intervention:  Discussion, Education, Exploration, Socialization and Support  Summary of Progress/Problems:  MHA speaker did not come to group today. CSW educated patients about various community resources Conservator, museum/gallery, dental clinic, and [facility]. CSW reviewed various groups and services offered by [facility]. Pt invited to ask questions or discuss any experiences with these services and share this information with other group members. Ladell was engaged and attentive throughout today's group. Zakya expressed interest in various groups offered by [facility] and discussed by CSW. Wendelin asked about her discharge plan regarding [name] and was offered encouragement and reassurance regarding her d/c plan, transport to facility, etc. Luzmaria demonstrated progress in the group setting AEB her ability to openly and actively participate in group discussion. Zamyiah demonstrates improving insight AEB he ability to process how the community resources presented during group can benefit her in terms of mental health maintenance.   Smart, Somya Jauregui 06/27/2013, 2:41 PM

## 2013-06-27 NOTE — Progress Notes (Signed)
[facility] MD Progress Note  06/27/2013 8:52 PM [name]  MRN:  [id number] Subjective:  Did sleep better, struggling with some anxiety. She is wanting to go to [facility]. She states she has realized she needs the help. States that when she took an honest look at herself, she recognized she was not going to make it. She did sleep better last night. She is working hard on not isolating what is her tenedency Diagnosis:   DSM5: Schizophrenia Disorders:   Obsessive-Compulsive Disorders:   Trauma-Stressor Disorders:  Posttraumatic Stress Disorder (309.81) Substance/Addictive Disorders:  Alcohol Related Disorder - Severe (303.90) and Cannabis Use Disorder - Moderate 9304.30) Depressive Disorders:  Major Depressive Disorder - Moderate (296.22)  Axis I: Substance Induced Mood Disorder  ADL's:  Intact  Sleep: Fair  Appetite:  Fair  Suicidal Ideation:  Plan:  denies Intent:  denies Means:  denies Homicidal Ideation:  Plan:  denies Intent:  denies Means:  denies AEB (as evidenced by):  Psychiatric Specialty Exam: Review of Systems  Constitutional: Negative.   HENT: Negative.   Eyes: Negative.   Respiratory: Negative.   Cardiovascular: Negative.   Gastrointestinal: Negative.   Genitourinary: Negative.   Musculoskeletal: Negative.   Skin: Negative.   Neurological: Negative.   Endo/Heme/Allergies: Negative.   Psychiatric/Behavioral: Positive for depression and substance abuse. The patient is nervous/anxious.     Blood pressure 126/76, pulse 84, temperature 98 F (36.7 C), temperature source Oral, resp. rate 19, height 5' 5.5" (1.664 m), weight 87.544 kg (193 lb), last menstrual period 06/13/2013, SpO2 98.00%.Body mass index is 31.62 kg/(m^2).  General Appearance: Fairly Groomed  Patent attorney::  Fair  Speech:  Clear and Coherent and Slow  Volume:  Decreased  Mood:  Anxious and Depressed  Affect:  Restricted  Thought Process:  Coherent and Goal Directed  Orientation:  Full (Time,  Place, and Person)  Thought Content:  wirrues, concerns  Suicidal Thoughts:  No  Homicidal Thoughts:  No  Memory:  Immediate;   Fair Recent;   Fair Remote;   Fair  Judgement:  Fair  Insight:  Present  Psychomotor Activity:  Restlessness  Concentration:  Fair  Recall:  Fair  Akathisia:  No  Handed:    AIMS (if indicated):     Assets:  Desire for Improvement Social Support  Sleep:  Number of Hours: 6.75   Current Medications: Current Facility-Administered Medications  Medication Dose Route Frequency Provider Last Rate Last Dose  . alum & mag hydroxide-simeth (MAALOX/MYLANTA) 200-200-20 MG/5ML suspension 30 mL  30 mL Oral Q4H PRN Benjaman Pott, MD   30 mL at 06/24/13 0657  . ARIPiprazole (ABILIFY) tablet 5 mg  5 mg Oral Daily Rachael Fee, MD   5 mg at 06/27/13 4098  . chlordiazePOXIDE (LIBRIUM) capsule 25 mg  25 mg Oral Q6H PRN Rachael Fee, MD   25 mg at 06/27/13 1606  . ibuprofen (ADVIL,MOTRIN) tablet 200 mg  200 mg Oral Q6H PRN Verne Spurr, PA-C   200 mg at 06/26/13 1210  . magnesium hydroxide (MILK OF MAGNESIA) suspension 30 mL  30 mL Oral Daily PRN Benjaman Pott, MD   30 mL at 06/24/13 2121  . multivitamin with minerals tablet 1 tablet  1 tablet Oral Daily Lavena Bullion, RD   1 tablet at 06/27/13 0823  . nicotine (NICODERM CQ - dosed in mg/24 hours) patch 21 mg  21 mg Transdermal Q0600 Rachael Fee, MD   21 mg at 06/27/13 1191  .  OLANZapine-FLUoxetine (SYMBYAX) 6-25 MG per capsule 1 capsule  1 capsule Oral QHS Rachael Fee, MD   1 capsule at 06/26/13 2120  . traZODone (DESYREL) tablet 50 mg  50 mg Oral QHS PRN Rachael Fee, MD   50 mg at 06/26/13 2120   Facility-Administered Medications Ordered in Other Encounters  Medication Dose Route Frequency Provider Last Rate Last Dose  . naproxen (NAPROSYN) tablet 375 mg  375 mg Oral TID WC Verne Spurr, PA-C        Lab Results: No results found for this or any previous visit (from the past 48 hour(s)).  Physical  Findings: AIMS: Facial and Oral Movements Muscles of Facial Expression: None, normal Lips and Perioral Area: None, normal Jaw: None, normal Tongue: None, normal,Extremity Movements Upper (arms, wrists, hands, fingers): None, normal Lower (legs, knees, ankles, toes): None, normal, Trunk Movements Neck, shoulders, hips: None, normal, Overall Severity Severity of abnormal movements (highest score from questions above): None, normal Incapacitation due to abnormal movements: None, normal Patient's awareness of abnormal movements (rate only patient's report): No Awareness, Dental Status Current problems with teeth and/or dentures?: No Does patient usually wear dentures?: No  CIWA:    COWS:     Treatment Plan Summary: Daily contact with patient to assess and evaluate symptoms and progress in treatment Medication management  Plan: Supportive approach/coping skills/relapse prevention           Reassess response to  Abilify           Continue meds  Medical Decision Making Problem Points:  Review of psycho-social stressors (1) Data Points:  Review of new medications or change in dosage (2)  I certify that inpatient services furnished can reasonably be expected to improve the patient's condition.   Meesha Sek A 06/27/2013, 8:52 PM

## 2013-06-27 NOTE — Progress Notes (Signed)
Adult Psychoeducational Group Note  Date:  06/27/2013 Time:  1:12 PM  Group Topic/Focus:  Recovery Goals:   The focus of this group is to identify appropriate goals for recovery and establish a plan to achieve them.  Participation Level:  Active  Participation Quality:  Appropriate and Attentive  Affect:  Appropriate  Cognitive:  Alert and Appropriate  Insight: Good  Engagement in Group:  Engaged  Modes of Intervention:  Activity, Discussion, Exploration, Socialization and Support  Additional Comments:  Pt came to group and shared that herself and dwelling on the past are standing between her and recovery. Pt plans on changing this by forgiving herself and letting go of the past and continuing treatment at Hospice and [name].  [name] 06/27/2013, 1:12 PM

## 2013-06-27 NOTE — Clinical Social Work Note (Signed)
Pt accepted into [name] for Wed 11/12. She will be transported at 12:00PM tomorrow/Wed and must have 14 day medication supply. MD notified.  [employer], LCSWA 06/27/2013 3:38 PM

## 2013-06-28 MED ORDER — OLANZAPINE-FLUOXETINE HCL 6-25 MG PO CAPS: 1.0000 | ORAL_CAPSULE | Freq: Every day | ORAL | Status: DC

## 2013-06-28 MED ORDER — ARIPIPRAZOLE 5 MG PO TABS: 5.0000 mg | ORAL_TABLET | Freq: Every day | ORAL | Status: DC

## 2013-06-28 MED ORDER — TRAZODONE HCL 50 MG PO TABS: 50.0000 mg | ORAL_TABLET | Freq: Every evening | ORAL | Status: DC | PRN

## 2013-06-28 NOTE — Tx Team (Signed)
  Interdisciplinary Treatment Plan Update   Date Reviewed:  06/28/2013  Time Reviewed:  10:59 AM  Progress in Treatment:   Attending groups: Yes Participating in groups: Yes Taking medication as prescribed: Yes  Tolerating medication: Yes Family/Significant other contact made: Yes, with pt's husband   Patient understands diagnosis: Yes  As evidenced by asking for help with depression and thoughts of self harm Discussing patient identified problems/goals with staff: Yes  See initial care plan Medical problems stabilized or resolved: Yes Denies suicidal/homicidal ideation: Yes  During group/self report.  Patient has not harmed self or others: Yes  For review of initial/current patient goals, please see plan of care.  Estimated Length of Stay:  d/c today  Reason for Continuation of Hospitalization:  d/c today  New Problems/Goals identified:  N/A  Discharge Plan or Barriers:   Pt is being [facility] by [facility] to this facility at 12PM today. Pt needs 14 day med supply (ordered by MD/RN staff made aware) and must be ready for d/c no later than 11:45AM. She will also follow up with Dr. Evelene Croon (appt made for Dec) after [name] 14 day stay.   Additional Comments:  N/a    Attendees:  Signature: Geoffery Lyons, MD 06/28/2013 10:59 AM   Signature: Trula Slade, LCSWA   06/28/2013 10:59 AM  Signature: Darden Dates Nurse CM 06/28/2013 10:59 AM  Signature: Maureen Ralphs RN 06/28/2013 10:59 AM  Signature: Lupita Leash RN 06/28/2013 10:59 AM  Signature: Massie Kluver Care Coordination 06/28/2013 10:59 AM  Signature:   06/28/2013 10:59 AM  Signature:    Signature:    Signature:    Signature:    Signature:    Signature:      Scribe for Treatment Team:   Trula Slade, LCSWA  06/28/2013 10:59 AM

## 2013-06-28 NOTE — Progress Notes (Signed)
Recreation Therapy Notes  Date: 11.11.2014 Time: 3:00pm Location: [address] Dayroom  Group Topic: Communication, Team Building, Problem Solving  Goal Area(s) Addresses:  Patient will effectively work with peer towards shared goal.  Patient will identify skill used to make activity successful.  Patient will identify how skills used during activity can be used to reach post d/c goals.   Behavioral Response: Engaged, Appropriate   Intervention: Problem Solving Activity  Activity: Landing Pad. In teams patients were given 12 plastic drinking straws and a length of masking tape. Using the materials provided patients were asked to build a landing pad to catch a golf ball dropped from approximately 4 feet in the air.   Education: Team Work, Special educational needs teacher, Journalist, newspaper, Building control surveyor.    Education Outcome: Acknowledges understanding   Clinical Observations/Feedback: Patient actively engaged in group activity, sharing ideas with team mates and assisting with construction of team's landing pad.  Patient contributed to group discussion, relating team work to being a parent, as well as working with her support system post d/c to support and encourage her sobriety. Patient additionally identified importance of communication to that process. Patient made no additionally contributions to group discussion, but appeared to actively listen as she maintained appropriate eye contact with speaker.   Marykay Lex Jean Skow, LRT/CTRS  Kamiyah Kindel L 06/28/2013 8:28 AM

## 2013-06-28 NOTE — Progress Notes (Signed)
Pt was discharged to [facility] today.  She denied any S/I H/I or A/V hallucinations.    She was given f/u appointment, rx, sample medications, hotline info booklet. She voiced understanding to all instructions provided.  She declined the need for smoking cessation materials.

## 2013-06-28 NOTE — [facility] Suicide Risk Assessment (Signed)
Suicide Risk Assessment  Discharge Assessment     Demographic Factors:  Caucasian  Mental Status Per Nursing Assessment::   On Admission:     Current Mental Status by Physician: In full contact with reality. There are no active S/S of withdrawal. Her mood is anxious. She is worried as far as going to a strange place, but more so she is worried about getting sober and having to deal with the feelings. She is much more insightful and understands that she needs to do this work. Her husband is supportive   Loss Factors: Loss of significant relationship  Historical Factors: NA  Risk Reduction Factors:   Sense of responsibility to family, Living with another person, especially a relative and Positive social support  Continued Clinical Symptoms:  Depression:   Comorbid alcohol abuse/dependence Alcohol/Substance Abuse/Dependencies  Cognitive Features That Contribute To Risk:  Polarized thinking Thought constriction (tunnel vision)    Suicide Risk:  Minimal: No identifiable suicidal ideation.  Patients presenting with no risk factors but with morbid ruminations; may be classified as minimal risk based on the severity of the depressive symptoms  Discharge Diagnoses:   AXIS I:  Alcohol Abuse, Major Depression, Recurrent severe and Post Traumatic Stress Disorder AXIS II:  Deferred AXIS III:   Past Medical History  Diagnosis Date  . Depression   . Abnormal Pap smear and cervical HPV (human papillomavirus)     CIN in 2005  . Tobacco abuse   . Scoliosis 11/12/2012  . Right foot injury 11/12/2012    Took place 10/27/2012   AXIS IV:  other psychosocial or environmental problems AXIS V:  51-60 moderate symptoms  Plan Of Care/Follow-up recommendations:  Activity:  as tolerated Diet:  regular  Is patient on multiple antipsychotic therapies at discharge:  Yes Has Patient had three or more failed trials of antipsychotic monotherapy by history:  No  Recommended Plan for Multiple  Antipsychotic Therapies: Taper to monotherapy as described:  in the next 2 weeks Abilify can be taper down to 2 mg  Additional reason(s) for multiple antispychotic treatment:  she is taking Symbyax what has Zyprexa attached to Prozac. She has not been able to tolerte any antidepressants including Prozac o its own. she is tolerating Prozac combined with Zyprexa. But the depression has persited. the strategy is have the Abilify as an augmenting agent at least short term and see if it can help improve the depression. She has not tolerated higher dosages of Prozac in the past.   [name] A 06/28/2013, 9:01 AM

## 2013-06-28 NOTE — [facility] Group Notes (Signed)
[facility] LCSW Aftercare Discharge Planning Group Note   06/28/2013 11:05 AM  Participation Quality:  Appropriate   Mood/Affect:  Appropriate  Depression Rating:  3  Anxiety Rating:  (high) due to nervousness around going to [facility] today. "It's a good nervous feeling."   Thoughts of Suicide:  No Will you contract for safety?   NA  Current AVH:  No  Plan for Discharge/Comments:  Pt will be transported by [name] at 12PM today (14 day med supply required-MD placed order this morning and RN staff made aware of this necessity). She will follow up with Dr. Evelene Croon (appt in Dec) after d/c from [facility]. Pt reports feeling nervous but hopeful about her aftercare plan.   Transportation Means: [name] will transport pt to this facility at 12PM today, 06/28/13   Supports: husband/family supports identified by pt.   Smart, [employer]

## 2013-06-28 NOTE — Progress Notes (Signed)
[facility] Adult Case Management Discharge Plan :  Will you be returning to the same living situation after discharge: No.[name] at d/c.  At discharge, do you have transportation home?:Yes,  [name] to transport pt to facility. Do you have the ability to pay for your medications:Yes,  [employer] private insurance.  Release of information consent forms completed and in the chart;  Patient's signature needed at discharge.  Patient to Follow up at: Follow-up Information   Follow up with [facility]  On 08/01/2013. (At 12:15PM.  See Dr. Evelene Croon.  Please call if you need an earlier appointment date.  )    Contact information:   [address] [place] [phone]      Follow up with [name] On 06/28/2013. ([name] to transport patient to facility at 12PM )    Contact information:   [address] [place], [place] [address] Phone: [phone] Fax: [phone]      Patient denies SI/HI:   Yes,  during group/self report.     Safety Planning and Suicide Prevention discussed:  Yes,  SPE completed with pt's husband. SPI pamphlet provided to pt and she was encouraged to share this with her support network.   Smart, Alyzae Hawkey 06/28/2013, 11:26 AM

## 2013-07-03 NOTE — Discharge Summary (Signed)
Physician Discharge Summary Note  Patient:  [name] is an 32 y.o., female MRN:  [id number] DOB:  [birth year] Patient phone:  [phone] (home)  Patient address:   [address] [place] [address],   Date of Admission:  06/22/2013 Date of Discharge: 06/28/2013  Reason for Admission:  Alcohol detox/dependency, depression  Discharge Diagnoses: Principal Problem:   Severe recurrent major depression Active Problems:   Alcohol abuse   PTSD (post-traumatic stress disorder)  Review of Systems  Constitutional: Negative.   HENT: Negative.   Eyes: Negative.   Respiratory: Negative.  Negative for sputum production.   Cardiovascular: Negative.   Gastrointestinal: Negative.   Genitourinary: Negative.   Musculoskeletal: Negative.   Skin: Negative.   Neurological: Negative.   Endo/Heme/Allergies: Negative.   Psychiatric/Behavioral: Positive for substance abuse. The patient is nervous/anxious.     DSM5:  Trauma-Stressor Disorders:  Posttraumatic Stress Disorder (309.81) Substance/Addictive Disorders:  Alcohol Related Disorder - Severe (303.90), Alcohol Intoxication with Use Disorder - Severe (F10.229) and Alcohol Withdrawal (291.81) Depressive Disorders:  Major Depressive Disorder - Severe (296.23)  Axis Diagnosis:   AXIS I:  Alcohol Abuse, Anxiety Disorder NOS, Major Depression, Recurrent severe and Post Traumatic Stress Disorder AXIS II:  Deferred AXIS III:   Past Medical History  Diagnosis Date  . Depression   . Abnormal Pap smear and cervical HPV (human papillomavirus)     CIN in 2005  . Tobacco abuse   . Scoliosis 11/12/2012  . Right foot injury 11/12/2012    Took place 10/27/2012   AXIS IV:  other psychosocial or environmental problems, problems related to social environment and problems with primary support group AXIS V:  61-70 mild symptoms  Level of Care:  [name]  Hospital Course:  On admission:  32 Y/O female who states she tried to commit suicide. Had 6 or 7  shots of Vodka, and took " a lot" of 1 mg Xanax. (gets 120 and half a bottle was missing and she has gotten it 2 or 3 days before.) States has had a lot of losses two sisters died this year. Mother died las year. Her oldest son went to live with his biological father. Her 8 Y/O son is autistic. Drinking twice a week, past year, Vodka, beer. He has gotten increasingly more depressed. Got out of work went to a bar. Called her husband to tell him she was feeling like killing herself. States she told him to pick up their son, to go home and then do whatever she wanted to do. She states she was not intoxicated as has had only "two drinks" picked her son up, got home , drank some more took the OD. Husband found her in bed hardly breathing and called 911  During hospitalization:  [facility] alcohol protocol implemented successfully.  Medications managed:  Symbyax 6-25 for depression and mood stability daily continued from her home medications.  Her Xanax was not continued.  Abilify 5 mg daily for depression and Trazodone 50 mg for sleep issues.  Cristi attended and participated in therapy.  She denied suicidal/homicidal ideations and auditory/visual hallucinations, follow-up appointments encouraged to attend, outside support groups encouraged and information given, Rx given.  Sarahanne is mentally and physically stable for discharge.  Consults:  None  Significant Diagnostic Studies:  labs: completed, reviewed, stable  Discharge Vitals:   Blood pressure 119/81, pulse 116, temperature 98.2 F (36.8 C), temperature source Oral, resp. rate 16, height 5' 5.5" (1.664 m), weight 87.544 kg (193 lb), last menstrual period  06/13/2013, SpO2 98.00%. Body mass index is 31.62 kg/(m^2). Lab Results:   No results found for this or any previous visit (from the past 72 hour(s)).  Physical Findings: AIMS: Facial and Oral Movements Muscles of Facial Expression: None, normal Lips and Perioral Area: None, normal Jaw: None,  normal Tongue: None, normal,Extremity Movements Upper (arms, wrists, hands, fingers): None, normal Lower (legs, knees, ankles, toes): None, normal, Trunk Movements Neck, shoulders, hips: None, normal, Overall Severity Severity of abnormal movements (highest score from questions above): None, normal Incapacitation due to abnormal movements: None, normal Patient's awareness of abnormal movements (rate only patient's report): No Awareness, Dental Status Current problems with teeth and/or dentures?: No Does patient usually wear dentures?: No  CIWA:    COWS:     Psychiatric Specialty Exam: See Psychiatric Specialty Exam and Suicide Risk Assessment completed by Attending Physician prior to discharge.  Discharge destination:  [name]  Is patient on multiple antipsychotic therapies at discharge:  Yes  Has Patient had three or more failed trials of antipsychotic monotherapy by history:  No  Recommended Plan for Multiple Antipsychotic Therapies: The patient is using Symbyax a fixed combination of Prozac and Zyprexa. She has not been able to tolerate other antidepressants. The Abilify was added short term to augment the Symbyax. Abilify could be D/C as soon as there is a better response to the Symbyax. (S/S of depression improve as assessed by Dr. Evelene Croon)      Medication List    STOP taking these medications       ALPRAZolam 1 MG tablet  Commonly known as:  XANAX     naproxen sodium 220 MG tablet  Commonly known as:  ANAPROX      TAKE these medications     Indication   ARIPiprazole 5 MG tablet  Commonly known as:  ABILIFY  Take 1 tablet (5 mg total) by mouth daily.   Indication:  Major Depressive Disorder     OLANZapine-FLUoxetine 6-25 MG per capsule  Commonly known as:  SYMBYAX  Take 1 capsule by mouth at bedtime. For mood control   Indication:  Major Depressive Disorder     traZODone 50 MG tablet  Commonly known as:  DESYREL  Take 1 tablet (50 mg total) by mouth at bedtime as  needed for sleep (may repeat X 1).   Indication:  Trouble Sleeping           Follow-up Information   Follow up with [facility]  On 08/01/2013. (At 12:15PM.  See Dr. Evelene Croon.  Please call if you need an earlier appointment date.  )    Contact information:   [address] [place] [phone]      Follow up with [name] On 06/28/2013. ([name] to transport patient to facility at 12PM )    Contact information:   [address] [place], [place] [address] Phone: [phone] Fax: [phone]      Follow-up recommendations:  Activity:  as tolerated Diet:  low-sodium heart healthy diet Continue to work your relapse prevention plan Comments:  Patient will continue her sobriety at [facility].  Total Discharge Time:  Greater than 30 minutes.  SignedNanine Means, PMH-NP 07/03/2013, 3:01 PM Agree with assessment and plan Madie Reno A. Dub Mikes, M.D.

## 2013-07-03 NOTE — Progress Notes (Signed)
Patient Discharge Instructions:  After Visit Summary (AVS):   Faxed to:  07/03/13 Discharge Summary Note:   Faxed to:  07/03/13 Psychiatric Admission Assessment Note:   Faxed to:  07/03/13 Suicide Risk Assessment - Discharge Assessment:   Faxed to:  07/03/13 Faxed/Sent to the Next Level Care provider:  07/03/13 Faxed to [facility] @ [phone] Faxed to [facility] @ [phone]  Jerelene Redden, 07/03/2013, 3:04 PM

## 2014-01-19 ENCOUNTER — Ambulatory Visit (INDEPENDENT_AMBULATORY_CARE_PROVIDER_SITE_OTHER): Payer: BC Managed Care – PPO | Admitting: General Surgery

## 2014-01-19 ENCOUNTER — Encounter (HOSPITAL_BASED_OUTPATIENT_CLINIC_OR_DEPARTMENT_OTHER): Payer: Self-pay | Admitting: *Deleted

## 2014-01-19 ENCOUNTER — Encounter (INDEPENDENT_AMBULATORY_CARE_PROVIDER_SITE_OTHER): Payer: Self-pay | Admitting: General Surgery

## 2014-01-19 VITALS — BP 128/80 | HR 81 | Temp 97.4°F | Ht 66.0 in | Wt 202.0 lb

## 2014-01-19 DIAGNOSIS — K649 Unspecified hemorrhoids: Secondary | ICD-10-CM

## 2014-01-19 MED ORDER — LIDOCAINE 5 % EX OINT: 1.0000 "application " | TOPICAL_OINTMENT | CUTANEOUS | Status: DC | PRN

## 2014-01-19 NOTE — Patient Instructions (Signed)
Shopping List- Supplies to purchase before rectal surgery  1. 100% cotton balls- you may also buy a roll of cotton and tear off a small piece or buy large cotton rounds in a bag.  You will wear a small piece next to the anal opening as a dressing following surgery.  Make sure they are 100% cotton and not rayon,which can be irritating to your skin.  2 Stool softener (Docusate Sodium or Colace)- The generic equivalent is fine.  These can be purchased over the counter.  You may want to start taking these (one in the morning and one in the evening) three days prior to surgery, so that you bowel movements after surgery will be soft.  Continue taking them until it no longer hurts to have a bowel movement.   3. Fiber supplement- such as Metamucil, FiberCon, Benefiber or Citrucel.  Take one dose in the morning and one in the evening to help reach a goal of 25g of fiber per day.  You can find this information on the label of the supplement. 4. Naproxen or Ibuprofen- this will be taken with your prescription pain medications to assist in your pain control after surgery. 5. Laxative- Milk of Magnesia or a generic equivalent will be fine.  You may need to use this if you become constipated after surgery. 6. Prune juice- just an ounce or two in the morning may keep you regular without having to use any laxatives 7. Portable Sitz bath (Optional)- recommended if you have difficulty getting in and out of a tub multiple times a day.  8.  Fleet Enemas (green and white box)- to use in preparation for surgery and you may need one afterwards if your bowels don't move with the laxative.  Purchase at least 3 doses.

## 2014-01-19 NOTE — Progress Notes (Signed)
eval hems    HISTORY: [name] is a 33 y.o. female who presents to the office with rectal bleding. Other symptoms include pain with BM's, itching, burning. This had been occurring for ~8 yrs, but getting worse. I last saw her a year ago and recommended a fiber supplement.  She takes 2 capsules a day.  She has tried hemorrhoid creams in the past with some success. Hard BM's makes the symptoms worse. It is intermittent in nature. Her bowel habits are regular but feels that she strains quite often and has to take laxatives to make the stool liquid enough to pass during flares. She has never had a colonoscopy. She does feel prolapsing tissue with BM's.  Past Medical History   Diagnosis  Date   .  Depression    .  Abnormal Pap smear and cervical HPV (human papillomavirus)      CIN in 2005   .  Tobacco abuse    .  Scoliosis  11/12/2012   .  Right foot injury  11/12/2012     Took place 10/27/2012    Past Surgical History   Procedure  Laterality  Date   .  Appendectomy   1993   .  Tubal ligation   2006    Current Outpatient Prescriptions   Medication  Sig  Dispense  Refill   .  ALPRAZolam (XANAX) 1 MG tablet  Take 1 mg by mouth 2 (two) times daily.     Marland Kitchen.  lidocaine-hydrocortisone (ANAMANTEL HC) 3-0.5 % CREA  Place 1 Applicatorful rectally 2 (two) times daily.  7 g  0   .  naproxen sodium (ANAPROX) 220 MG tablet  Take 440 mg by mouth every 12 (twelve) hours as needed (headache).     .  risperiDONE (RISPERDAL) 1 MG tablet  Take 1 tablet (1 mg total) by mouth 2 (two) times daily. For mood control  60 tablet  0   .  traZODone (DESYREL) 100 MG tablet  Take 1 tablet (100 mg total) by mouth at bedtime as needed for sleep. For sleep and depression  30 tablet  0    No current facility-administered medications for this visit.    Facility-Administered Medications Ordered in Other Visits   Medication  Dose  Route  Frequency  Provider  Last Rate  Last Dose   .  naproxen (NAPROSYN) tablet 375 mg  375  mg  Oral  TID WC  Verne SpurrNeil Mashburn, PA-C      No Known Allergies  Family History   Problem  Relation  Age of Onset   .  Cancer  Father      prostate   .  Cancer  Sister      bladder   .  Lung disease  Mother    .  Cancer  Maternal Uncle      Lung   .  Cancer  Paternal Aunt      Breast   .  Cancer  Maternal Grandfather    .  Cancer  Paternal Grandfather    .  Cancer  Paternal Aunt      Breast    History    Social History   .  Marital Status:  Married     Spouse Name:  N/A     Number of Children:  N/A   .  Years of Education:  N/A    Social History Main Topics   .  Smoking status:  Current Every Day Smoker --  0.50 packs/day for 19 years     Types:  Cigarettes   .  Smokeless tobacco:  Never Used   .  Alcohol Use:  1.2 oz/week     2 Cans of beer per week      Comment: Weekly.   .  Drug Use:  Yes     Special:  Marijuana   .  Sexually Active:  Yes     Birth Control/ Protection:  None    Other Topics  Concern   .  None    Social History Narrative    Pt also has hx of an aunt and uncle with lung cancer.          REVIEW OF SYSTEMS - PERTINENT POSITIVES ONLY:  Review of Systems - General ROS: negative for - chills, fever or weight loss  Hematological and Lymphatic ROS: negative for - bleeding problems, blood clots or bruising  Respiratory ROS: no cough, shortness of breath, or wheezing  Cardiovascular ROS: no chest pain or dyspnea on exertion  Gastrointestinal ROS: positive for - blood in stools  negative for - abdominal pain or nausea/vomiting  Genito-Urinary ROS: no dysuria, trouble voiding, or hematuria  EXAM:  Filed Vitals:   01/19/14 1039  BP: 128/80  Pulse: 81  Temp: 97.4 F (36.3 C)    General appearance: alert and cooperative  Resp: clear to auscultation bilaterally  Cardio: regular rate and rhythm  GI: soft, non-tender; bowel sounds normal; no masses, no organomegaly   Procedure: Anal exam Surgeon: Maisie Fus  Diagnosis: rectal pain  Assistant: Christella Scheuermann   After the risks and benefits were explained, verbal consent was obtained for above procedure  Anesthesia: none  Findings: moderate pain with exam, no fissure appreciated, L lateral and anterior skin tags, non-inflamed   ASSESSMENT AND PLAN:  [name] is a 33 y.o. F with rectal pain and very classic hemorrhoid symptoms. On exam she has some non-inflamed hemorrhoidal disease. On previous exam, she had grade 2 internal hemorrhoids.  She has failed medical management.  She is requesting further treatment.  I think this is reasonable.  She understands that recovery will be painful and she will need to be out of work for several weeks.  Other risks include bleeding, infection, urinary retention and damage to adjacent tissues. She may also have a recurrence if she doesn't correct the underlying issue causing her hemorrhoids to flare.  I believe she understands this and has agreed to proceed.    Vanita Panda, MD  Colon and Rectal Surgery / General Surgery  [facility]  Visit Diagnoses:  1.  Hemorrhoids    Primary Care Physician:  Tana Conch, MD

## 2014-01-19 NOTE — Progress Notes (Signed)
NPO AFTER MN.  ARRIVE AT 0830. NEEDS HG.  MAY XANAX IF NEEDED AM DOS W/ SIPS OF WATER AND TYLENOL.

## 2014-01-22 ENCOUNTER — Ambulatory Visit (HOSPITAL_BASED_OUTPATIENT_CLINIC_OR_DEPARTMENT_OTHER)
Admission: RE | Admit: 2014-01-22 | Discharge: 2014-01-22 | Disposition: A | Discharge status: Home / Self Care | Payer: BC Managed Care – PPO | Source: Ambulatory Visit | Attending: General Surgery | Admitting: General Surgery

## 2014-01-22 ENCOUNTER — Encounter (HOSPITAL_BASED_OUTPATIENT_CLINIC_OR_DEPARTMENT_OTHER): Payer: Self-pay

## 2014-01-22 ENCOUNTER — Encounter (HOSPITAL_BASED_OUTPATIENT_CLINIC_OR_DEPARTMENT_OTHER): Payer: BC Managed Care – PPO | Admitting: Anesthesiology

## 2014-01-22 ENCOUNTER — Encounter (HOSPITAL_BASED_OUTPATIENT_CLINIC_OR_DEPARTMENT_OTHER)
Admission: RE | Disposition: A | Discharge status: Home / Self Care | Payer: Self-pay | Source: Ambulatory Visit | Attending: General Surgery

## 2014-01-22 ENCOUNTER — Ambulatory Visit (HOSPITAL_BASED_OUTPATIENT_CLINIC_OR_DEPARTMENT_OTHER): Payer: BC Managed Care – PPO | Admitting: Anesthesiology

## 2014-01-22 DIAGNOSIS — K648 Other hemorrhoids: Secondary | ICD-10-CM

## 2014-01-22 DIAGNOSIS — M4120 Other idiopathic scoliosis, site unspecified: Secondary | ICD-10-CM | POA: Insufficient documentation

## 2014-01-22 DIAGNOSIS — F3289 Other specified depressive episodes: Secondary | ICD-10-CM | POA: Insufficient documentation

## 2014-01-22 DIAGNOSIS — K644 Residual hemorrhoidal skin tags: Secondary | ICD-10-CM

## 2014-01-22 DIAGNOSIS — F329 Major depressive disorder, single episode, unspecified: Secondary | ICD-10-CM | POA: Insufficient documentation

## 2014-01-22 DIAGNOSIS — Z79899 Other long term (current) drug therapy: Secondary | ICD-10-CM | POA: Insufficient documentation

## 2014-01-22 DIAGNOSIS — F17200 Nicotine dependence, unspecified, uncomplicated: Secondary | ICD-10-CM | POA: Insufficient documentation

## 2014-01-22 HISTORY — PX: EVALUATION UNDER ANESTHESIA WITH HEMORRHOIDECTOMY: SHX5624

## 2014-01-22 HISTORY — DX: Residual hemorrhoidal skin tags: K64.4

## 2014-01-22 HISTORY — DX: Other specified diseases of anus and rectum: K62.89

## 2014-01-22 HISTORY — DX: Other hemorrhoids: K64.8

## 2014-01-22 HISTORY — DX: Post-traumatic stress disorder, unspecified: F43.10

## 2014-01-22 HISTORY — DX: Alcohol abuse, uncomplicated: F10.10

## 2014-01-22 HISTORY — DX: Major depressive disorder, single episode, unspecified: F32.9

## 2014-01-22 HISTORY — DX: Personal history of cervical dysplasia: Z87.410

## 2014-01-22 LAB — POCT HEMOGLOBIN-HEMACUE: HEMOGLOBIN: 14.4 g/dL (ref 12.0–15.0)

## 2014-01-22 SURGERY — EXAM UNDER ANESTHESIA WITH HEMORRHOIDECTOMY
Anesthesia: General | Site: Rectum

## 2014-01-22 MED ORDER — OXYCODONE HCL 5 MG PO TABS: 5.0000 mg | ORAL_TABLET | ORAL | Status: DC | PRN

## 2014-01-22 MED ORDER — DIAZEPAM 5 MG PO TABS: 5.0000 mg | ORAL_TABLET | Freq: Four times a day (QID) | ORAL | Status: DC | PRN

## 2014-01-22 MED ORDER — LIDOCAINE HCL (CARDIAC) 20 MG/ML IV SOLN
INTRAVENOUS | Status: DC | PRN
  Administered 2014-01-22: 100 mg via INTRAVENOUS

## 2014-01-22 MED ORDER — FENTANYL CITRATE 0.05 MG/ML IJ SOLN
INTRAMUSCULAR | Status: AC
  Filled 2014-01-22: qty 6

## 2014-01-22 MED ORDER — MIDAZOLAM HCL 5 MG/5ML IJ SOLN
INTRAMUSCULAR | Status: DC | PRN
  Administered 2014-01-22: 2 mg via INTRAVENOUS

## 2014-01-22 MED ORDER — HYDROMORPHONE HCL PF 1 MG/ML IJ SOLN
0.2500 mg | INTRAMUSCULAR | Status: DC | PRN
  Filled 2014-01-22: qty 1

## 2014-01-22 MED ORDER — PROPOFOL 10 MG/ML IV BOLUS
INTRAVENOUS | Status: DC | PRN
  Administered 2014-01-22: 200 mg via INTRAVENOUS

## 2014-01-22 MED ORDER — SODIUM CHLORIDE 0.9 % IJ SOLN
3.0000 mL | INTRAMUSCULAR | Status: DC | PRN
  Filled 2014-01-22: qty 3

## 2014-01-22 MED ORDER — MIDAZOLAM HCL 2 MG/2ML IJ SOLN
INTRAMUSCULAR | Status: AC
  Filled 2014-01-22: qty 2

## 2014-01-22 MED ORDER — ONDANSETRON HCL 4 MG/2ML IJ SOLN
INTRAMUSCULAR | Status: DC | PRN
  Administered 2014-01-22: 4 mg via INTRAVENOUS

## 2014-01-22 MED ORDER — BUPIVACAINE-EPINEPHRINE 0.5% -1:200000 IJ SOLN
INTRAMUSCULAR | Status: DC | PRN
  Administered 2014-01-22: 10 mL

## 2014-01-22 MED ORDER — SUCCINYLCHOLINE CHLORIDE 20 MG/ML IJ SOLN
INTRAMUSCULAR | Status: DC | PRN
  Administered 2014-01-22: 120 mg via INTRAVENOUS

## 2014-01-22 MED ORDER — BUPIVACAINE LIPOSOME 1.3 % IJ SUSP
20.0000 mL | INTRAMUSCULAR | Status: DC
Start: 2014-01-22 — End: 2014-01-22

## 2014-01-22 MED ORDER — SODIUM CHLORIDE 0.9 % IR SOLN
Status: DC | PRN
  Administered 2014-01-22: 500 mL

## 2014-01-22 MED ORDER — ACETAMINOPHEN 650 MG RE SUPP
650.0000 mg | RECTAL | Status: DC | PRN
  Filled 2014-01-22: qty 1

## 2014-01-22 MED ORDER — SODIUM CHLORIDE 0.9 % IJ SOLN
3.0000 mL | Freq: Two times a day (BID) | INTRAMUSCULAR | Status: DC
  Filled 2014-01-22: qty 3

## 2014-01-22 MED ORDER — PROMETHAZINE HCL 25 MG/ML IJ SOLN
6.2500 mg | INTRAMUSCULAR | Status: DC | PRN
  Filled 2014-01-22: qty 1

## 2014-01-22 MED ORDER — SODIUM CHLORIDE 0.9 % IV SOLN
250.0000 mL | INTRAVENOUS | Status: DC | PRN
  Filled 2014-01-22: qty 250

## 2014-01-22 MED ORDER — ACETAMINOPHEN 325 MG PO TABS
650.0000 mg | ORAL_TABLET | ORAL | Status: DC | PRN
  Filled 2014-01-22: qty 2

## 2014-01-22 MED ORDER — LACTATED RINGERS IV SOLN
INTRAVENOUS | Status: DC
  Administered 2014-01-22 (×2): via INTRAVENOUS
  Filled 2014-01-22: qty 1000

## 2014-01-22 MED ORDER — FENTANYL CITRATE 0.05 MG/ML IJ SOLN
INTRAMUSCULAR | Status: DC | PRN
  Administered 2014-01-22: 25 ug via INTRAVENOUS
  Administered 2014-01-22: 50 ug via INTRAVENOUS
  Administered 2014-01-22: 25 ug via INTRAVENOUS

## 2014-01-22 MED ORDER — DOCUSATE SODIUM 100 MG PO CAPS: 100.0000 mg | ORAL_CAPSULE | Freq: Three times a day (TID) | ORAL | Status: DC

## 2014-01-22 MED ORDER — OXYCODONE HCL 5 MG PO TABS
5.0000 mg | ORAL_TABLET | ORAL | Status: DC | PRN
  Administered 2014-01-22: 5 mg via ORAL
  Filled 2014-01-22: qty 2

## 2014-01-22 MED ORDER — OXYCODONE HCL 5 MG PO TABS
ORAL_TABLET | ORAL | Status: AC
  Filled 2014-01-22: qty 1

## 2014-01-22 MED ORDER — DEXAMETHASONE SODIUM PHOSPHATE 4 MG/ML IJ SOLN
INTRAMUSCULAR | Status: DC | PRN
  Administered 2014-01-22: 10 mg via INTRAVENOUS

## 2014-01-22 SURGICAL SUPPLY — 44 items
BLADE HEX COATED 2.75 (ELECTRODE) ×2 IMPLANT
BLADE SURG 10 STRL SS (BLADE) ×3 IMPLANT
BRIEF STRETCH FOR OB PAD LRG (UNDERPADS AND DIAPERS) ×4 IMPLANT
CLOTH BEACON ORANGE TIMEOUT ST (SAFETY) ×2 IMPLANT
COVER MAYO STAND STRL (DRAPES) ×2 IMPLANT
COVER TABLE BACK 60X90 (DRAPES) ×2 IMPLANT
DECANTER SPIKE VIAL GLASS SM (MISCELLANEOUS) ×2 IMPLANT
DRAPE PED LAPAROTOMY (DRAPES) ×2 IMPLANT
DRSG PAD ABDOMINAL 8X10 ST (GAUZE/BANDAGES/DRESSINGS) ×1 IMPLANT
ELECT BLADE 6.5 .24CM SHAFT (ELECTRODE) ×2 IMPLANT
ELECT REM PT RETURN 9FT ADLT (ELECTROSURGICAL) ×2
ELECTRODE REM PT RTRN 9FT ADLT (ELECTROSURGICAL) ×1 IMPLANT
GAUZE SPONGE 4X4 16PLY XRAY LF (GAUZE/BANDAGES/DRESSINGS) ×3 IMPLANT
GLOVE BIO SURGEON STRL SZ 6.5 (GLOVE) ×2 IMPLANT
GLOVE INDICATOR 7.0 STRL GRN (GLOVE) ×2 IMPLANT
GOWN STRL NON-REIN LRG LVL3 (GOWN DISPOSABLE) ×1 IMPLANT
GOWN STRL REUS W/ TWL LRG LVL3 (GOWN DISPOSABLE) IMPLANT
GOWN STRL REUS W/TWL 2XL LVL3 (GOWN DISPOSABLE) ×3 IMPLANT
GOWN STRL REUS W/TWL LRG LVL3 (GOWN DISPOSABLE) ×2
NDL HYPO 25X1 1.5 SAFETY (NEEDLE) ×1 IMPLANT
NEEDLE HYPO 22GX1.5 SAFETY (NEEDLE) ×2 IMPLANT
NEEDLE HYPO 25X1 1.5 SAFETY (NEEDLE) ×2 IMPLANT
NS IRRIG 500ML POUR BTL (IV SOLUTION) ×2 IMPLANT
PACK BASIN DAY SURGERY FS (CUSTOM PROCEDURE TRAY) ×2 IMPLANT
PAD ABD 8X10 STRL (GAUZE/BANDAGES/DRESSINGS) ×1 IMPLANT
PENCIL BUTTON HOLSTER BLD 10FT (ELECTRODE) ×2 IMPLANT
SPONGE GAUZE 4X4 12PLY (GAUZE/BANDAGES/DRESSINGS) ×2 IMPLANT
SPONGE GAUZE 4X4 12PLY STER LF (GAUZE/BANDAGES/DRESSINGS) ×1 IMPLANT
SPONGE SURGIFOAM ABS GEL 100 (HEMOSTASIS) IMPLANT
SPONGE SURGIFOAM ABS GEL 12-7 (HEMOSTASIS) ×1 IMPLANT
STAPLER PROXIMATE HCS (STAPLE) IMPLANT
STAPLER VISISTAT 35W (STAPLE) ×1 IMPLANT
SUT CHROMIC 2 0 SH (SUTURE) ×2 IMPLANT
SUT GUT CHROMIC 3 0 (SUTURE) ×2 IMPLANT
SUT PROLENE 2 0 BLUE (SUTURE) IMPLANT
SUT VIC AB 4-0 P-3 18XBRD (SUTURE) IMPLANT
SUT VIC AB 4-0 P3 18 (SUTURE)
SUT VIC AB 4-0 SH 18 (SUTURE) IMPLANT
SYR CONTROL 10ML LL (SYRINGE) ×3 IMPLANT
TAPE CLOTH SURG 4X10 WHT LF (GAUZE/BANDAGES/DRESSINGS) ×1 IMPLANT
TRAY DSU PREP LF (CUSTOM PROCEDURE TRAY) ×2 IMPLANT
TUBE CONNECTING 12X1/4 (SUCTIONS) ×2 IMPLANT
WATER STERILE IRR 500ML POUR (IV SOLUTION) ×1 IMPLANT
YANKAUER SUCT BULB TIP NO VENT (SUCTIONS) ×2 IMPLANT

## 2014-01-22 NOTE — Discharge Instructions (Addendum)
ANORECTAL SURGERY: POST OP INSTRUCTIONS °1. Take your usually prescribed home medications unless otherwise directed. °2. DIET: During the first few hours after surgery sip on some liquids until you are able to urinate.  It is normal to not urinate for several hours after this surgery.  If you feel uncomfortable, please contact the office for instructions.  After you are able to urinate,you may eat, if you feel like it.  Follow a light bland diet the first 24 hours after arrival home, such as soup, liquids, crackers, etc.  Be sure to include lots of fluids daily (6-8 glasses).  Avoid fast food or heavy meals, as your are more likely to get nauseated.  Eat a low fat diet the next few days after surgery.  Limit caffeine intake to 1-2 servings a day. °3. PAIN CONTROL: °a. Pain is best controlled by a usual combination of several different methods TOGETHER: °i. Muscle relaxation °1.  Soak in a warm bath (or Sitz bath) three times a day and after bowel movements.  Continue to do this until all pain is resolved. °2. Take the muscle relaxer (Valium) every 6 hours for the first 2 days after surgery  °ii. Over the counter pain medication °iii. Prescription pain medication °b. Most patients will experience some swelling and discomfort in the anus/rectal area and incisions.  Heat such as warm towels, sitz baths, warm baths, etc to help relax tight/sore spots and speed recovery.  Some people prefer to use ice, especially in the first couple days after surgery, as it may decrease the pain and swelling, or alternate between ice & heat.  Experiment to what works for you.  Swelling and bruising can take several weeks to resolve.  Pain can take even longer to completely resolve. °c. It is helpful to take an over-the-counter pain medication regularly for the first few weeks.  Choose one of the following that works best for you: °i. Naproxen (Aleve, etc)  Two 220mg tabs twice a day °ii. Ibuprofen (Advil, etc) Three 200mg tabs four  times a day (every meal & bedtime) °d. A  prescription for pain medication (such as percocet, oxycodone, hydrocodone, etc) should be given to you upon discharge.  Take your pain medication as prescribed.  °i. If you are having problems/concerns with the prescription medicine (does not control pain, nausea, vomiting, rash, itching, etc), please call us [phone] to see if we need to switch you to a different pain medicine that will work better for you and/or control your side effect better. °ii. If you need a refill on your pain medication, please contact your pharmacy.  They will contact our office to request authorization. Prescriptions will not be filled after 5 pm or on week-ends. °4. KEEP YOUR BOWELS REGULAR and AVOID CONSTIPATION °a. The goal is one to two soft bowel movements a day.  You should at least have a bowel movement every other day. °b. Avoid getting constipated.  Between the surgery and the pain medications, it is common to experience some constipation. This can be very painful after rectal surgery.  Increasing fluid intake and taking a fiber supplement (such as Metamucil, Citrucel, FiberCon, etc) 1-2 times a day regularly will usually help prevent this problem from occurring.  A stool softener like colace is also recommended.  This can be purchased over the counter at your pharmacy.  You can take it up to 3 times a day.  If you do not have a bowel movement after 24 hrs since your surgery,   take one does of milk of magnesia.  If you still haven't had a bowel movement 8-12 hours after that dose, take another dose.  If you don't have a bowel movement 48 hrs after surgery, purchase a Fleets enema from the drug store and administer gently per package instructions.  If you still are having trouble with your bowel movements after that, please call the office for further instructions. c. If you develop diarrhea or have many loose bowel movements, simplify your diet to bland foods & liquids for a few  days.  Stop any stool softeners and decrease your fiber supplement.  Switching to mild anti-diarrheal medications (Kayopectate, Pepto Bismol) can help.  If this worsens or does not improve, please call us.  5. Wound Care a. Remove your bandages before your first bowel movement or 8 hours after surgery.     b. Remove any wound packing material at this time as well.   Wear an absorbent pad or soft cotton gauze in your underwear to catch any drainage and help keep the area clean. You should change this every 2-3 hours while awake. c. Keep the area clean and dry.  Bathe / shower every day, especially after bowel movements.  Keep the area clean by showering / bathing over the incision / wound.  Wet wipes or showers / gentle washing after bowel movements is often less traumatic than regular toilet paper. d. [name] may have some styrofoam-like soft packing in the rectum which will come out with the first bowel movement.  e. You will often notice bleeding with bowel movements.  This should slow down by the end of the first week of surgery f. Expect some drainage.  This should slow down too, by the end of the first week of surgery.  Wear an absorbent pad or soft cotton gauze in your underwear until the drainage stops. g. Do Not sit on a rubber or pillow ring.  This can make you symptoms worse.  You may sit on a soft pillow if needed.  6. ACTIVITIES as tolerated:   a. You may resume regular (light) daily activities beginning the next day--such as daily self-care, walking, climbing stairs--gradually increasing activities as tolerated.  If you can walk 30 minutes without difficulty, it is safe to try more intense activity such as jogging, treadmill, bicycling, low-impact aerobics, swimming, etc. b. Save the most intensive and strenuous activity for last such as sit-ups, heavy lifting, contact sports, etc  Refrain from any heavy lifting or straining until you are off narcotics for pain control.   c. You may drive when  you are no longer taking prescription pain medication, you can comfortably sit for long periods of time, and you can safely maneuver your car and apply brakes. d. [name] may have sexual intercourse when it is comfortable.  7. FOLLOW UP in our office a. Please call [facility] at [phone] to set up an appointment to see your surgeon in the office for a follow-up appointment approximately 3-4 weeks after your surgery. b. Make sure that you call for this appointment the day you arrive home to insure a convenient appointment time. 10. IF YOU HAVE DISABILITY OR FAMILY LEAVE FORMS, BRING THEM TO THE OFFICE FOR PROCESSING.  DO NOT GIVE THEM TO YOUR DOCTOR.     WHEN TO CALL us [phone]: 1. Poor pain control 2. Reactions / problems with new medications (rash/itching, nausea, etc)  3. Fever over 101.5 F (38.5 C) 4. Inability to urinate 5. Nausea and/or  vomiting 6. Worsening swelling or bruising 7. Continued bleeding from incision. 8. Increased pain, redness, or drainage from the incision  The clinic staff is available to answer your questions during regular business hours (8:30am-5pm).  Please dont hesitate to call and ask to speak to one of our nurses for clinical concerns.   [facility] from [facility] is always on call at the hospitals   If you have a medical emergency, go to the nearest emergency room or call 911.    [facility] [address], [address], [place], [place]  [address] ? MAIN: [phone] ? TOLL FREE: [phone] ? FAX [phone] [email or web address]    Post Anesthesia Home Care Instructions  Activity: Get plenty of rest for the remainder of the day. A responsible adult should stay with you for 24 hours following the procedure.  For the next 24 hours, DO NOT: -Drive a car -Advertising copywriterperate machinery -Drink alcoholic beverages -Take any medication unless instructed by your physician -Make any legal decisions or sign  important papers.  Meals: Start with liquid foods such as gelatin or soup. Progress to regular foods as tolerated. Avoid greasy, spicy, heavy foods. If nausea and/or vomiting occur, drink only clear liquids until the nausea and/or vomiting subsides. Call your physician if vomiting continues.  Special Instructions/Symptoms: Your throat may feel dry or sore from the anesthesia or the breathing tube placed in your throat during surgery. If this causes discomfort, gargle with warm salt water. The discomfort should disappear within 24 hours.

## 2014-01-22 NOTE — Transfer of Care (Signed)
Immediate Anesthesia Transfer of Care Note  Patient: [name]  Procedure(s) Performed: Procedure(s) (LRB): EXAM UNDER ANESTHESIA WITH HEMORRHOIDECTOMY (N/A)  Patient Location: PACU  Anesthesia Type: General  Level of Consciousness: awake, oriented, sedated and patient cooperative  Airway & Oxygen Therapy: Patient Spontanous Breathing and Patient connected to face mask oxygen  Post-op Assessment: Report given to PACU RN and Post -op Vital signs reviewed and stable  Post vital signs: Reviewed and stable  Complications: No apparent anesthesia complications

## 2014-01-22 NOTE — Anesthesia Procedure Notes (Signed)
Procedure Name: Intubation Date/Time: 01/22/2014 10:12 AM Performed by: Renella Cunas D Pre-anesthesia Checklist: Patient identified, Emergency Drugs available, Suction available and Patient being monitored Patient Re-evaluated:Patient Re-evaluated prior to inductionOxygen Delivery Method: Circle System Utilized Preoxygenation: Pre-oxygenation with 100% oxygen Intubation Type: IV induction Ventilation: Mask ventilation without difficulty Laryngoscope Size: Mac and 3 Grade View: Grade I Tube type: Oral Tube size: 7.0 mm Number of attempts: 1 Airway Equipment and Method: stylet and oral airway Placement Confirmation: ETT inserted through vocal cords under direct vision,  positive ETCO2 and breath sounds checked- equal and bilateral Secured at: 21 cm Tube secured with: Tape Dental Injury: Teeth and Oropharynx as per pre-operative assessment

## 2014-01-22 NOTE — Op Note (Signed)
01/22/2014  11:25 AM  PATIENT:  Haley PellegriniJessica D Reilly  33 y.o. female  Patient Care Team: Shelva MajesticStephen O Hunter, MD as PCP - General (Family Medicine)  PRE-OPERATIVE DIAGNOSIS:  Internal and external hemorrhoids   POST-OPERATIVE DIAGNOSIS:    Internal and external hemorrhoids   PROCEDURE:  EXAM UNDER ANESTHESIA WITH 3 COLUMN HEMORRHOIDECTOMY  SURGEON:  Surgeon(s): Romie LeveeAlicia Gizell Danser, MD  ASSISTANT: none   ANESTHESIA:   local and general  SPECIMEN:  Source of Specimen:  hemorrhoid tissue  DISPOSITION OF SPECIMEN:  PATHOLOGY  COUNTS:  YES  PLAN OF CARE: Discharge to home after PACU  PATIENT DISPOSITION:  PACU - hemodynamically stable.  INDICATION: This is a 33 year old female who presents to the office with intractable pain due to the external and internal hemorrhoids. She has been on a high-fiber diet for several months and is using stool softeners and sitz baths to control her pain. She has tried over-the-counter creams as well as prescription medications. She continues to have hemorrhoidal pain. The risk and benefits of the procedure were splayed to the patient prior to the OR and consent was signed and placed on chart.   OR FINDINGS: Grade 2 internal hemorrhoids with large external hemorrhoid components   DESCRIPTION: the patient was identified in the preoperative holding area and taken to the OR where they were laid on the operating room table.  General anesthesia was induced without difficulty. The patient was then positioned in prone jackknife position with buttocks gently taped apart.  The patient was then prepped and draped in usual sterile fashion.  SCDs were noted to be in place prior to the initiation of anesthesia. A surgical timeout was performed indicating the correct patient, procedure, positioning and need for preoperative antibiotics.  I began with a digital rectal exam.  There were no masses palpated.  I then placed a rectal block using Marcaine with epinephrine.  I then placed  a Hill-Ferguson anoscope into the anal canal and evaluated this completely.  Multiple hemorrhoidal columns could be identified. The largest external hemorrhoid was the left lateral hemorrhoid. The right anterior and posterior hemorrhoids were smaller. I decided to begin with the left lateral hemorrhoid. The skin was transected using a 10 blade scalpel. I then carefully dissected the hemorrhoidal tissue away from the muscle tissue below. This was done using Metzenbaum scissors and blunt dissection. After this was completed the hemorrhoidal tissue was removed and a 2-0 chromic suture was used to close the internal component of the incision. After this was completed, the remaining external hemorrhoidal tissue was carefully dissected underneath the skin flaps and removed. I then closed the external component using the a running 3-0 chromic suture. Hemostasis was good. A separate suture was placed to close a small skin defect in the posterior side of the left lateral hemorrhoid. I then terminated attention to the right anterior hemorrhoidal column. This was transected in similar fashion. A 2-0 chromic suture was used to close the internal component. The external skin flaps were freed of hemorrhoidal tissue and then closed using a 3-0 chromic suture. Lastly the right posterior hemorrhoidal column was resected and closed in the same fashion. I then evaluated for hemostasis. There was a small point of bleeding in between the internal and external hemorrhoidal closure in the right anterior hemorrhoid site. Direct pressure was held. The area continued to have some bleeding. A separate 3-0 chromic suture was placed. The area continued to bleed despite direct pressure. I placed a rectal block using Exparel.  I  then placed a small piece of Gelfoam over the area with intermittent bleeding in the right anterior position. After this was completed lidocaine ointment was placed on the external component of the hemorrhoid and a  pressure dressing was applied. The patient was then awakened from anesthesia and sent to the postanesthesia care in stable condition. All counts were correct per operating room staff.

## 2014-01-22 NOTE — Anesthesia Preprocedure Evaluation (Addendum)
Anesthesia Evaluation  Patient identified by MRN, date of birth, ID band Patient awake    Reviewed: Allergy & Precautions, H&P , NPO status , Patient's Chart, lab work & pertinent test results  Airway Mallampati: II TM Distance: <3 FB Neck ROM: Full    Dental no notable dental hx.    Pulmonary Current Smoker,  breath sounds clear to auscultation  Pulmonary exam normal       Cardiovascular negative cardio ROS  Rhythm:Regular Rate:Normal     Neuro/Psych Depression negative neurological ROS     GI/Hepatic negative GI ROS, (+)     substance abuse  alcohol use and marijuana use,   Endo/Other  negative endocrine ROS  Renal/GU negative Renal ROS  negative genitourinary   Musculoskeletal negative musculoskeletal ROS (+)   Abdominal   Peds negative pediatric ROS (+)  Hematology negative hematology ROS (+)   Anesthesia Other Findings   Reproductive/Obstetrics negative OB ROS                          Anesthesia Physical Anesthesia Plan  ASA: III  Anesthesia Plan: General   Post-op Pain Management:    Induction: Intravenous  Airway Management Planned: Oral ETT  Additional Equipment:   Intra-op Plan:   Post-operative Plan: Extubation in OR  Informed Consent: I have reviewed the patients History and Physical, chart, labs and discussed the procedure including the risks, benefits and alternatives for the proposed anesthesia with the patient or authorized representative who has indicated his/her understanding and acceptance.   Dental advisory given  Plan Discussed with: CRNA and Surgeon  Anesthesia Plan Comments:         Anesthesia Quick Evaluation

## 2014-01-22 NOTE — Anesthesia Postprocedure Evaluation (Signed)
  Anesthesia Post-op Note  Patient: [name]  Procedure(s) Performed: Procedure(s) (LRB): EXAM UNDER ANESTHESIA WITH HEMORRHOIDECTOMY (N/A)  Patient Location: PACU  Anesthesia Type: General  Level of Consciousness: awake and alert   Airway and Oxygen Therapy: Patient Spontanous Breathing  Post-op Pain: mild  Post-op Assessment: Post-op Vital signs reviewed, Patient's Cardiovascular Status Stable, Respiratory Function Stable, Patent Airway and No signs of Nausea or vomiting  Last Vitals:  Filed Vitals:   01/22/14 1145  BP: 123/90  Pulse: 85  Temp:   Resp: 14    Post-op Vital Signs: stable   Complications: No apparent anesthesia complications

## 2014-01-22 NOTE — Interval H&P Note (Signed)
History and Physical Interval Note:  01/22/2014 9:39 AM  [name]  has presented today for surgery, with the diagnosis of hemorrhoids   The various methods of treatment have been discussed with the patient and family. After consideration of risks, benefits and other options for treatment, the patient has consented to  Procedure(s): EXAM UNDER ANESTHESIA WITH HEMORRHOIDECTOMY (N/A) as a surgical intervention .  The patient's history has been reviewed, patient examined, no change in status, stable for surgery.  I have reviewed the patient's chart and labs.  Questions were answered to the patient's satisfaction.     Romie Levee

## 2014-01-22 NOTE — H&P (View-Only) (Signed)
eval hems    HISTORY: [name] is a 33 y.o. female who presents to the office with rectal bleding. Other symptoms include pain with BM's, itching, burning. This had been occurring for ~8 yrs, but getting worse. I last saw her a year ago and recommended a fiber supplement.  She takes 2 capsules a day.  She has tried hemorrhoid creams in the past with some success. Hard BM's makes the symptoms worse. It is intermittent in nature. Her bowel habits are regular but feels that she strains quite often and has to take laxatives to make the stool liquid enough to pass during flares. She has never had a colonoscopy. She does feel prolapsing tissue with BM's.  Past Medical History   Diagnosis  Date   .  Depression    .  Abnormal Pap smear and cervical HPV (human papillomavirus)      CIN in 2005   .  Tobacco abuse    .  Scoliosis  11/12/2012   .  Right foot injury  11/12/2012     Took place 10/27/2012    Past Surgical History   Procedure  Laterality  Date   .  Appendectomy   1993   .  Tubal ligation   2006    Current Outpatient Prescriptions   Medication  Sig  Dispense  Refill   .  ALPRAZolam (XANAX) 1 MG tablet  Take 1 mg by mouth 2 (two) times daily.     .  lidocaine-hydrocortisone (ANAMANTEL HC) 3-0.5 % CREA  Place 1 Applicatorful rectally 2 (two) times daily.  7 g  0   .  naproxen sodium (ANAPROX) 220 MG tablet  Take 440 mg by mouth every 12 (twelve) hours as needed (headache).     .  risperiDONE (RISPERDAL) 1 MG tablet  Take 1 tablet (1 mg total) by mouth 2 (two) times daily. For mood control  60 tablet  0   .  traZODone (DESYREL) 100 MG tablet  Take 1 tablet (100 mg total) by mouth at bedtime as needed for sleep. For sleep and depression  30 tablet  0    No current facility-administered medications for this visit.    Facility-Administered Medications Ordered in Other Visits   Medication  Dose  Route  Frequency  Provider  Last Rate  Last Dose   .  naproxen (NAPROSYN) tablet 375 mg  375  mg  Oral  TID WC  Neil Mashburn, PA-C      No Known Allergies  Family History   Problem  Relation  Age of Onset   .  Cancer  Father      prostate   .  Cancer  Sister      bladder   .  Lung disease  Mother    .  Cancer  Maternal Uncle      Lung   .  Cancer  Paternal Aunt      Breast   .  Cancer  Maternal Grandfather    .  Cancer  Paternal Grandfather    .  Cancer  Paternal Aunt      Breast    History    Social History   .  Marital Status:  Married     Spouse Name:  N/A     Number of Children:  N/A   .  Years of Education:  N/A    Social History Main Topics   .  Smoking status:  Current Every Day Smoker --   0.50 packs/day for 19 years     Types:  Cigarettes   .  Smokeless tobacco:  Never Used   .  Alcohol Use:  1.2 oz/week     2 Cans of beer per week      Comment: Weekly.   .  Drug Use:  Yes     Special:  Marijuana   .  Sexually Active:  Yes     Birth Control/ Protection:  None    Other Topics  Concern   .  None    Social History Narrative    Pt also has hx of an aunt and uncle with lung cancer.          REVIEW OF SYSTEMS - PERTINENT POSITIVES ONLY:  Review of Systems - General ROS: negative for - chills, fever or weight loss  Hematological and Lymphatic ROS: negative for - bleeding problems, blood clots or bruising  Respiratory ROS: no cough, shortness of breath, or wheezing  Cardiovascular ROS: no chest pain or dyspnea on exertion  Gastrointestinal ROS: positive for - blood in stools  negative for - abdominal pain or nausea/vomiting  Genito-Urinary ROS: no dysuria, trouble voiding, or hematuria  EXAM:  Filed Vitals:   01/19/14 1039  BP: 128/80  Pulse: 81  Temp: 97.4 F (36.3 C)    General appearance: alert and cooperative  Resp: clear to auscultation bilaterally  Cardio: regular rate and rhythm  GI: soft, non-tender; bowel sounds normal; no masses, no organomegaly   Procedure: Anal exam Surgeon: Maisie Fus  Diagnosis: rectal pain  Assistant: Christella Scheuermann   After the risks and benefits were explained, verbal consent was obtained for above procedure  Anesthesia: none  Findings: moderate pain with exam, no fissure appreciated, L lateral and anterior skin tags, non-inflamed   ASSESSMENT AND PLAN:  [name] is a 33 y.o. F with rectal pain and very classic hemorrhoid symptoms. On exam she has some non-inflamed hemorrhoidal disease. On previous exam, she had grade 2 internal hemorrhoids.  She has failed medical management.  She is requesting further treatment.  I think this is reasonable.  She understands that recovery will be painful and she will need to be out of work for several weeks.  Other risks include bleeding, infection, urinary retention and damage to adjacent tissues. She may also have a recurrence if she doesn't correct the underlying issue causing her hemorrhoids to flare.  I believe she understands this and has agreed to proceed.    Vanita Panda, MD  Colon and Rectal Surgery / General Surgery  [facility]  Visit Diagnoses:  1.  Hemorrhoids    Primary Care Physician:  Tana Conch, MD

## 2014-01-24 ENCOUNTER — Encounter (HOSPITAL_BASED_OUTPATIENT_CLINIC_OR_DEPARTMENT_OTHER): Payer: Self-pay | Admitting: General Surgery

## 2014-01-29 ENCOUNTER — Telehealth (INDEPENDENT_AMBULATORY_CARE_PROVIDER_SITE_OTHER): Payer: Self-pay | Admitting: General Surgery

## 2014-01-29 ENCOUNTER — Other Ambulatory Visit (INDEPENDENT_AMBULATORY_CARE_PROVIDER_SITE_OTHER): Payer: Self-pay | Admitting: General Surgery

## 2014-01-29 DIAGNOSIS — Z9889 Other specified postprocedural states: Secondary | ICD-10-CM

## 2014-01-29 MED ORDER — OXYCODONE HCL 5 MG PO TABS: 5.0000 mg | ORAL_TABLET | Freq: Four times a day (QID) | ORAL | Status: DC | PRN

## 2014-01-29 NOTE — Telephone Encounter (Signed)
Pt s/p hemorrhoidectomy on 01/22/14 and needs a refill on her oxycodone.  Patient reports pain at a 9 that is burning, sharp and constant.  She is having to take meds Q4H PRN along with stool softeners and milk of mag.  She is continuing her sitz baths 4-5x day.  She has also been using the lidocaine ointment but ran out yesterday. Informed her that I would send this refill request around to Dr. Maisie Fushomas and we would get in touch with her once we have received a response. Patient verbalized understanding.

## 2014-01-29 NOTE — Telephone Encounter (Signed)
I have printed a refill that she can pick up, but she should start to wean off of this, and this bottle should last until her apt, which should be in 2 more weeks.  She should use sitz baths and lidocaine ointment to minimize her pain.  She should take schedule ibuprofen or Alleve as well.  She should be having daily bowel movements, as constipation can increase pain exponentially.

## 2014-01-29 NOTE — Telephone Encounter (Signed)
Called pt back to inform her that her Rx is ready for pick up at our front desk.  Informed her of the information listed below from Dr. Maisie Fushomas.  Patient verbalized understanding.

## 2014-01-31 ENCOUNTER — Telehealth (INDEPENDENT_AMBULATORY_CARE_PROVIDER_SITE_OTHER): Payer: Self-pay

## 2014-01-31 NOTE — Telephone Encounter (Signed)
Informed pt of Dr Maurine Ministerhomas's recommendations. Pt states that she will start taking the miralax BID and stop taking the MOM. Pt verbalized understanding.

## 2014-01-31 NOTE — Telephone Encounter (Signed)
We discussed in great detail preoperatively that this was going to be a painful surgery.  She should be weaning off of the narcotics and they are written for q6h prn.  She should be using sitz baths and lidocaine ointment to control most of her pain and use the narcotics for breakthrough.  She should be using stool softeners tid and miralax bid.  If she follows these instructions, she shouldn't need the MoM.  Please remind her that I will not be refilling her narcotics again until she sees me back in the office on July 6.

## 2014-01-31 NOTE — Telephone Encounter (Signed)
Pt s/p hemorrhoidectomy on 01/22/14. Pt states that she is having a lot of pain and she feels like she has a hemorrhoid again. Informed pt that after surgery she is going to have pain and have some swelling. Advised pt to continue her sitz baths 4-5x day.  She is having to take meds Q4H PRN along with stool softeners and milk of mag twice a day. Advised pt to try and take Ibuprofen also to help with pain. Informed pt that I would let Dr Maisie Fushomas know of her concerns. Pt verbalized understanding and agrees with POC.

## 2014-02-19 ENCOUNTER — Encounter (INDEPENDENT_AMBULATORY_CARE_PROVIDER_SITE_OTHER): Payer: BC Managed Care – PPO | Admitting: General Surgery

## 2014-07-16 ENCOUNTER — Ambulatory Visit
Admission: RE | Admit: 2014-07-16 | Discharge: 2014-07-16 | Disposition: A | Discharge status: Home / Self Care | Payer: BC Managed Care – PPO | Source: Ambulatory Visit | Attending: Family Medicine | Admitting: Family Medicine

## 2014-07-16 ENCOUNTER — Encounter: Payer: Self-pay | Admitting: Family Medicine

## 2014-07-16 ENCOUNTER — Ambulatory Visit (INDEPENDENT_AMBULATORY_CARE_PROVIDER_SITE_OTHER): Payer: BC Managed Care – PPO | Admitting: Family Medicine

## 2014-07-16 VITALS — BP 115/77 | HR 91 | Temp 98.1°F | Wt 203.0 lb

## 2014-07-16 DIAGNOSIS — M25512 Pain in left shoulder: Secondary | ICD-10-CM

## 2014-07-16 MED ORDER — IBUPROFEN 800 MG PO TABS: 800.0000 mg | ORAL_TABLET | Freq: Three times a day (TID) | ORAL | Status: DC | PRN

## 2014-07-16 MED ORDER — HYDROCODONE-ACETAMINOPHEN 5-325 MG PO TABS: 1.0000 | ORAL_TABLET | Freq: Four times a day (QID) | ORAL | Status: DC | PRN

## 2014-07-16 NOTE — Assessment & Plan Note (Signed)
[facility] concerning for shoulder separation; No displacement noted on exam - Xrays ordered - Norco # 30 given for pain - Advised Ice BID; Ibuprofen 800mg  TID x 5 days - follow-up with PCP if not needing ortho referral for re-eval once pain is improvement and shoulder exercises.

## 2014-07-16 NOTE — Progress Notes (Signed)
   Subjective:    Patient ID: Lurena NidaJessica D Mccalla, female    DOB: 12/28/1980, 33 y.o.   MRN: [id number]  Seen for Same day visit for   CC: left shoulder pain  She reports falling at night ~ 3 days ago landing on her left shoulder. She has diffuse pain and reports limited ROM with ABduction. Has been using ice and ibuprofen for pain and swelling. Denies previous injuries to that shoulder, but Hx of shoulder dislocation on right. Denies any numbness, tingling or weakness in her left arm/hand. Denies fevers.   Review of Systems   See HPI for ROS. Objective:  BP 115/77 mmHg  Pulse 91  Temp(Src) 98.1 F (36.7 C) (Oral)  Wt 203 lb (92.08 kg)  LMP 07/02/2014 (Approximate)  General: NAD Shoulder: Inspection reveals no abnormalities, atrophy or asymmetry. Palpation: tenderness over AC joint  ROM: shoulder abduction limited to 90 degrees due to pain Rotator cuff strength normal throughout. No signs of impingement with negative Neer and Hawkin's tests, empty can sign. Positive cross arm abduction test No apprehension sign     Assessment & Plan:  See Problem List Documentation

## 2014-07-16 NOTE — Patient Instructions (Signed)
It was great seeing you today.   1. I will call you with the results of your shoulder xrays 2. Take Ibuprofen 800mg  every 8 hours for 5 days; as needed after that 3. Take Norco 1 pill every  6 hours as needed for pain  Please make follow-up appointment with Dr Jimmey RalphParker in 1-2 weeks to reassess your shoulder   If you have any questions or concerns before then, please call the clinic at [phone].  Take Care,   Dr Wenda LowJames Khadim Lundberg

## 2014-07-20 ENCOUNTER — Telehealth: Payer: Self-pay | Admitting: Family Medicine

## 2014-07-20 NOTE — Telephone Encounter (Signed)
Called and left VM for her to called back for results of her xray. Shoulder looks good, no fracture or dislocation. She should start range of motion exercises and call if she has additional questions.

## 2014-10-01 ENCOUNTER — Ambulatory Visit: Payer: Self-pay | Admitting: Family Medicine

## 2014-10-09 ENCOUNTER — Encounter: Payer: Self-pay | Admitting: Family Medicine

## 2014-11-13 ENCOUNTER — Encounter: Payer: Self-pay | Admitting: Family Medicine

## 2014-11-13 ENCOUNTER — Ambulatory Visit (INDEPENDENT_AMBULATORY_CARE_PROVIDER_SITE_OTHER): Payer: BLUE CROSS/BLUE SHIELD | Admitting: Family Medicine

## 2014-11-13 VITALS — BP 110/70 | HR 80 | Temp 98.6°F | Ht 66.0 in | Wt 203.0 lb

## 2014-11-13 DIAGNOSIS — R609 Edema, unspecified: Secondary | ICD-10-CM

## 2014-11-13 DIAGNOSIS — E669 Obesity, unspecified: Secondary | ICD-10-CM

## 2014-11-13 DIAGNOSIS — M2550 Pain in unspecified joint: Secondary | ICD-10-CM | POA: Diagnosis not present

## 2014-11-13 DIAGNOSIS — Z79899 Other long term (current) drug therapy: Secondary | ICD-10-CM

## 2014-11-13 DIAGNOSIS — R635 Abnormal weight gain: Secondary | ICD-10-CM | POA: Diagnosis not present

## 2014-11-13 LAB — CBC
HCT: 44.8 % (ref 36.0–46.0)
Hemoglobin: 15.3 g/dL — ABNORMAL HIGH (ref 12.0–15.0)
MCH: 31.9 pg (ref 26.0–34.0)
MCHC: 34.2 g/dL (ref 30.0–36.0)
MCV: 93.5 fL (ref 78.0–100.0)
MPV: 9.9 fL (ref 8.6–12.4)
PLATELETS: 230 10*3/uL (ref 150–400)
RBC: 4.79 MIL/uL (ref 3.87–5.11)
RDW: 13.2 % (ref 11.5–15.5)
WBC: 12.8 10*3/uL — ABNORMAL HIGH (ref 4.0–10.5)

## 2014-11-13 LAB — COMPREHENSIVE METABOLIC PANEL
ALBUMIN: 4.2 g/dL (ref 3.5–5.2)
ALT: 23 U/L (ref 0–35)
AST: 18 U/L (ref 0–37)
Alkaline Phosphatase: 61 U/L (ref 39–117)
BUN: 12 mg/dL (ref 6–23)
CO2: 26 meq/L (ref 19–32)
Calcium: 9.5 mg/dL (ref 8.4–10.5)
Chloride: 100 mEq/L (ref 96–112)
Creat: 0.76 mg/dL (ref 0.50–1.10)
Glucose, Bld: 102 mg/dL — ABNORMAL HIGH (ref 70–99)
Potassium: 4.2 mEq/L (ref 3.5–5.3)
SODIUM: 133 meq/L — AB (ref 135–145)
TOTAL PROTEIN: 7.5 g/dL (ref 6.0–8.3)
Total Bilirubin: 0.5 mg/dL (ref 0.2–1.2)

## 2014-11-13 LAB — LIPID PANEL
CHOLESTEROL: 211 mg/dL — AB (ref 0–200)
HDL: 21 mg/dL — ABNORMAL LOW (ref >=46)
LDL Cholesterol: 118 mg/dL — ABNORMAL HIGH (ref 0–99)
Total CHOL/HDL Ratio: 10 Ratio
Triglycerides: 362 mg/dL — ABNORMAL HIGH (ref <150)
VLDL: 72 mg/dL — ABNORMAL HIGH (ref 0–40)

## 2014-11-13 LAB — POCT GLYCOSYLATED HEMOGLOBIN (HGB A1C): Hemoglobin A1C: 5.5

## 2014-11-13 LAB — TSH: TSH: 2.49 u[IU]/mL (ref 0.350–4.500)

## 2014-11-13 MED ORDER — LORCASERIN HCL 10 MG PO TABS: 10.0000 mg | ORAL_TABLET | Freq: Two times a day (BID) | ORAL | Status: DC

## 2014-11-13 NOTE — Assessment & Plan Note (Signed)
Encouraged continued healthy lifestyle modifications. Encouraged patient to regularly exercise. Also referred patient to nutritionist. Given low side effect profile, will also proceed with short trial of Belviq. Patient educated on side effects. Will follow up at 1 month intervals for the next 3 months. If patient not demonstrating at least 5% weight loss in 3 months, will discontinue medication.

## 2014-11-13 NOTE — Progress Notes (Signed)
[name] is a 34 y.o. female who presents to the [facility] today with a chief complaint of weight gain. Her concerns today include:  HPI:  Weight Gain Patient reports significant weight gain in the past 2 years. States that she was in the 180s then and is now in the 200s. Reports that she feels like she is eating healthy and generally tried to avoid fried foods. States that she occasionally has a Radiographer, therapeutic. Drinks only water. Does endorse drinking up to 20 beers per week. Does not exercise regularly. States that she is on her feet all day (works as a Emergency planning/management officer). Patient interested in starting Belviq.   Edema Patient reports increased swelling in her bilateral legs, feet, arms, and hands for the past several months. Also endorses increased abdominal swelling during this time. States that the swelling is "always there" but is noticed to be worse at the end of the day after standing. No chest pain or shortness of breath. No orthopnea. No PND. Sleeps on 1 pillow at night. No urinary changes. Reports that she drinks 8 bottles of water per day, but always feels thirsty.   Polyarthralgia Patient reports several year history of pain and stiffness in her knees, hips, shoulders, elbows, and back. Reports that her mother was diagnosed with rheumatoid arthritis. Joints never become hot or red. States that she feels very stiff in the morning "like an old person." The stiffness resolves as the day goes on. Has tried taking OTC NSAIDs without significant relief of pain. States that her mother was diagnosed with RA at age 57.   Also endorses swelling on her right anterior lower extremity just below the knee. Started suddenly 3 months ago then has slowly resolved. Swelling was painful, but not red or warm to touch. Swelling is now mostly resolved.    ROS: As per HPI, otherwise all systems reviewed and are negative.  Past Medical History - Reviewed and updated Patient Active Problem List   Diagnosis  Date Noted  . Obesity 11/13/2014  . Edema 11/13/2014  . Polyarthralgia 11/13/2014  . Shoulder pain, left 07/16/2014  . PTSD (post-traumatic stress disorder) 06/23/2013  . Severe recurrent major depression 06/23/2013  . Hemorrhoids 02/07/2013  . Rectal pain 02/07/2013  . Generalized anxiety disorder 11/15/2012  . Cannabis abuse 11/13/2012    Class: Chronic  . Alcohol abuse 11/13/2012  . 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 TOBACCO USER 05/25/2009    Medications- reviewed and updated Current Outpatient Prescriptions  Medication Sig Dispense Refill  . ALPRAZolam (XANAX) 1 MG tablet Take 1 mg by mouth 4 (four) times daily as needed.     . diazepam (VALIUM) 5 MG tablet Take 1 tablet (5 mg total) by mouth every 6 (six) hours as needed (muscle spasms, inability to urinate). 20 tablet 0  . docusate sodium (COLACE) 100 MG capsule Take 1 capsule (100 mg total) by mouth 3 (three) times daily. 10 capsule 0  . HYDROcodone-acetaminophen (NORCO) 5-325 MG per tablet Take 1 tablet by mouth every 6 (six) hours as needed for moderate pain. 30 tablet 0  . ibuprofen (ADVIL,MOTRIN) 800 MG tablet Take 1 tablet (800 mg total) by mouth every 8 (eight) hours as needed. 30 tablet 0  . lidocaine (XYLOCAINE) 5 % ointment Apply 1 application topically as needed. 35.44 g 2  . Lorcaserin HCl (BELVIQ) 10 MG TABS Take 10 mg by mouth 2 (two) times daily. 60 tablet 2  . oxyCODONE (OXY IR/ROXICODONE) 5 MG immediate release tablet Take  1-2 tablets (5-10 mg total) by mouth every 6 (six) hours as needed for severe pain. 90 tablet 0   No current facility-administered medications for this visit.   Facility-Administered Medications Ordered in Other Visits  Medication Dose Route Frequency Provider Last Rate Last Dose  . naproxen (NAPROSYN) tablet 375 mg  375 mg Oral TID WC Marlane Hatcher Mashburn, PA-C        Objective: Physical Exam: BP 110/70 mmHg  Pulse 80  Temp(Src) 98.6 F (37 C) (Oral)  Ht _0  (1.676 m)  Wt 203 lb (92.08  kg)  BMI 32.78 kg/m2  LMP 10/23/2014  Gen: NAD, resting comfortably, sitting on exam table CV: RRR with no murmurs appreciated Lungs: NWOB, CTAB with no crackles, wheezes, or rhonchi Abdomen: Obese, Normal bowel sounds present. Soft, Nontender, Nondistended. Difficult to appreciate ascites due to body habitus. No hepatomegaly palpated.  Ext: 1+ pitting edema to knees bilaterally.  MSK: Bilateral knee joints without effusion. Good ROM. Nontender to palpation. Bilateral hands without effusion or deformities.  Skin: warm, dry Neuro: grossly normal, moves all extremities  Results for orders placed or performed in visit on 11/13/14 (from the past 72 hour(s))  POCT glycosylated hemoglobin (Hb A1C)     Status: None   Collection Time: 11/13/14  2:43 PM  Result Value Ref Range   Hemoglobin A1C 5.5     A/P: See problem list  Obesity Encouraged continued healthy lifestyle modifications. Encouraged patient to regularly exercise. Also referred patient to nutritionist. Given low side effect profile, will also proceed with short trial of Belviq. Patient educated on side effects. Will follow up at 1 month intervals for the next 3 months. If patient not demonstrating at least 5% weight loss in 3 months, will discontinue medication.   Edema Pitting edema to bilateral lower extremities. Differential includes hepatic, renal, and cardiac etiologies. Will obtain CBC, CMP, and TSH today. Plan to obtain UA at next visit if above work up unremarkable. Can also consider OSA, though patient does not have typical symptoms. No s/s of CHF. Potentially hepatic or malnutrition given history of alcohol abuse. No signs of venous insufficiency.    Polyarthralgia Patient with several symptoms of RA (symmetric polyarthragia, morning stiffness, possible joint effusion) though general benign physical exam. Given strong history of RA, will refer to rheumatology for further work up. Could consider obtaining RF, ANA, CCP, ESR,  and CRP, though unlikely to be helpful in making definitive diagnosis at this point.   Precepted with Dr Nori Riis.      Orders Placed This Encounter  Procedures  . CBC  . Comprehensive metabolic panel  . TSH  . Lipid Panel  . Ambulatory referral to Nutrition and Diabetic Education    Referral Priority:  Routine    Referral Type:  Consultation    Referral Reason:  Specialty Services Required    Number of Visits Requested:  1  . Ambulatory referral to Rheumatology    Referral Priority:  Routine    Referral Type:  Consultation    Referral Reason:  Specialty Services Required    Requested Specialty:  Rheumatology    Number of Visits Requested:  1  . POCT glycosylated hemoglobin (Hb A1C)  . POCT urinalysis dipstick    Standing Status: Future     Number of Occurrences:      Standing Expiration Date: 11/13/2015    Meds ordered this encounter  Medications  . Lorcaserin HCl (BELVIQ) 10 MG TABS    Sig: Take 10 mg by  mouth 2 (two) times daily.    Dispense:  60 tablet    Refill:  2     Edwyn Inclan M. Jerline Pain, [place] Resident PGY-1 11/13/2014 5:14 PM

## 2014-11-13 NOTE — Assessment & Plan Note (Addendum)
Pitting edema to bilateral lower extremities. Differential includes hepatic, renal, and cardiac etiologies. Will obtain CBC, CMP, and TSH today. Plan to obtain UA at next visit if above work up unremarkable. Can also consider OSA, though patient does not have typical symptoms. No s/s of CHF. Potentially hepatic or malnutrition given history of alcohol abuse. No signs of venous insufficiency.

## 2014-11-13 NOTE — Assessment & Plan Note (Signed)
Patient with several symptoms of RA (symmetric polyarthragia, morning stiffness, possible joint effusion) though general benign physical exam. Given strong history of RA, will refer to rheumatology for further work up. Could consider obtaining RF, ANA, CCP, ESR, and CRP, though unlikely to be helpful in making definitive diagnosis at this point.   Precepted with Dr Nori Riis.

## 2014-11-13 NOTE — Patient Instructions (Addendum)
Thank you for coming to the clinic today. It was nice seeing you.  For your weight gain, we will send in a prescription for Belviq. Your insurance may or may not cover this. Please take one pill twice daily. We would like to see you again in 1 month for a check up. It would also be helpful for you to see our nutritionist.   For your swelling in your legs and arms, we will be checking blood work today. It may also be related to your obesity. If your labs are normal we can start you on a small dose of lasix to take as needed for swelling.  For your joint pains, you may be showing early signs of rheumatoid arthritis. We would like to set you up with a rheumatologist for further evaluation.   We will see you again in 1 month.

## 2014-11-14 ENCOUNTER — Telehealth: Payer: Self-pay | Admitting: Family Medicine

## 2014-11-14 DIAGNOSIS — Z5181 Encounter for therapeutic drug level monitoring: Secondary | ICD-10-CM

## 2014-11-14 NOTE — Telephone Encounter (Signed)
Labs reviewed. Unremarkable except for elevated triglycerides (non fasting blood draw). Will obtain UA to rule out proteinuria. If negative, can consider starting diuretic therapy for leg edema (will likely use low dose spironolactone).   Katina Degreealeb M. Jimmey RalphParker, MD [facility] Resident PGY-1 11/14/2014 3:01 PM

## 2014-11-15 NOTE — Telephone Encounter (Signed)
Tried calling patient and phone just rang.  Will try calling one more time and then will send a letter if we can't reach her. Jazmin Hartsell,CMA

## 2014-11-19 ENCOUNTER — Other Ambulatory Visit: Payer: Self-pay | Admitting: Family Medicine

## 2014-11-19 MED ORDER — SPIRONOLACTONE 25 MG PO TABS: 25.0000 mg | ORAL_TABLET | Freq: Every day | ORAL | Status: DC

## 2014-11-19 NOTE — Telephone Encounter (Signed)
Advised pt as directed below and verbalized understanding. Scheduled her on 11/27/14 @11 :30am for urine collection as this was the earliest she could come. She wanted to if you received PA for Belviq yet because she went to get med at pharmacy and they told her it needed a PA and forms were sent to office. She also wanted to know if you could send something in to help with leg swelling like lasix or HCTZ for a week to see if this would help. Please advise. Katherin Ramey, CMA.

## 2014-11-19 NOTE — Telephone Encounter (Signed)
Pt is returning call re: test results, would like call back. Also needs to know if we received the prior authorization for the medication prescribed last week?

## 2014-11-19 NOTE — Telephone Encounter (Signed)
Tried calling pt phone kept ringing and ringing and no VM set up. Will try again later. Venba Zenner, CMA.

## 2014-11-19 NOTE — Telephone Encounter (Signed)
I did not see PA form in my box. We can start her on a diuretic for her leg edema - spironolactone. She can take 25mg  daily to start out with - I would advise taking in the morning or early afternoon to reduce increased urinary frequency at night. I still want to check her UA. Will check repeat blood work on 4/12 and I would like see her again in 3-4 weeks for follow up.   Katina Degreealeb M. Jimmey RalphParker, MD [facility] Resident PGY-1 11/19/2014 1:38 PM

## 2014-11-20 ENCOUNTER — Encounter: Payer: Self-pay | Admitting: *Deleted

## 2014-11-20 NOTE — Progress Notes (Signed)
[facility] [employer]. Formulary and PA form placed in provider box for completion. [name], Tamika L, RN

## 2014-11-20 NOTE — Telephone Encounter (Signed)
Tried calling pt and phone kept ringing and ringing, unable to leave msg due to no VM set-up. Will try again later. Terion Hedman, CMA.

## 2014-11-22 NOTE — Telephone Encounter (Signed)
Tried calling pt and phone kept ringing and ringing and no VM set up will try again later. Lealand Elting, CMA.

## 2014-11-22 NOTE — Progress Notes (Signed)
Rx filled out and given to Tamika.  Katina Degreealeb M. Jimmey RalphParker, MD [facility] Resident PGY-1 11/22/2014 1:51 PM

## 2014-11-26 ENCOUNTER — Encounter: Payer: Self-pay | Admitting: *Deleted

## 2014-11-26 NOTE — Progress Notes (Signed)
Received PA approval for Belviq via [employer] [place].  Med approved for 11/22/14 - 05/20/15.  [facility] pharmacy informed.  PA confirmation number [id number]. Cl[place]is PuMartin, Tamika L, RN

## 2014-11-26 NOTE — Telephone Encounter (Signed)
Mailed letter to pt today due to no return callbacks. Buzz Axel, CMA.

## 2014-11-27 ENCOUNTER — Other Ambulatory Visit (INDEPENDENT_AMBULATORY_CARE_PROVIDER_SITE_OTHER): Payer: BLUE CROSS/BLUE SHIELD

## 2014-11-27 DIAGNOSIS — R609 Edema, unspecified: Secondary | ICD-10-CM | POA: Diagnosis not present

## 2014-11-27 DIAGNOSIS — Z79899 Other long term (current) drug therapy: Secondary | ICD-10-CM

## 2014-11-27 LAB — POCT UA - MICROSCOPIC ONLY: Epithelial cells, urine per micros: >20

## 2014-11-27 LAB — POCT URINALYSIS DIPSTICK
Bilirubin, UA: NEGATIVE
GLUCOSE UA: NEGATIVE
Ketones, UA: NEGATIVE
LEUKOCYTES UA: NEGATIVE
NITRITE UA: NEGATIVE
Protein, UA: NEGATIVE
Spec Grav, UA: 1.01
UROBILINOGEN UA: 0.2
pH, UA: 6

## 2014-11-27 LAB — BASIC METABOLIC PANEL
BUN: 14 mg/dL (ref 6–23)
CALCIUM: 9.5 mg/dL (ref 8.4–10.5)
CHLORIDE: 100 meq/L (ref 96–112)
CO2: 21 meq/L (ref 19–32)
CREATININE: 0.85 mg/dL (ref 0.50–1.10)
GLUCOSE: 108 mg/dL — AB (ref 70–99)
Potassium: 4.6 mEq/L (ref 3.5–5.3)
Sodium: 134 mEq/L — ABNORMAL LOW (ref 135–145)

## 2014-11-27 NOTE — Progress Notes (Signed)
[facility] phlebotomist drew:  BMP Routine urine repeated

## 2014-11-29 ENCOUNTER — Encounter: Payer: Self-pay | Admitting: Family Medicine

## 2014-11-29 NOTE — Progress Notes (Signed)
Sent letter with results.  Katina Degreealeb M. Jimmey RalphParker, MD [facility] Resident PGY-1 11/29/2014 2:52 PM

## 2014-12-11 ENCOUNTER — Encounter: Payer: BLUE CROSS/BLUE SHIELD | Attending: "Endocrinology | Admitting: Dietician

## 2014-12-11 ENCOUNTER — Encounter: Payer: Self-pay | Admitting: Dietician

## 2014-12-11 VITALS — Ht 66.0 in | Wt 195.0 lb

## 2014-12-11 DIAGNOSIS — F419 Anxiety disorder, unspecified: Secondary | ICD-10-CM | POA: Insufficient documentation

## 2014-12-11 DIAGNOSIS — Z6831 Body mass index (BMI) 31.0-31.9, adult: Secondary | ICD-10-CM | POA: Diagnosis not present

## 2014-12-11 DIAGNOSIS — Z713 Dietary counseling and surveillance: Secondary | ICD-10-CM | POA: Diagnosis not present

## 2014-12-11 DIAGNOSIS — Z79899 Other long term (current) drug therapy: Secondary | ICD-10-CM | POA: Diagnosis not present

## 2014-12-11 DIAGNOSIS — Z833 Family history of diabetes mellitus: Secondary | ICD-10-CM | POA: Insufficient documentation

## 2014-12-11 DIAGNOSIS — F329 Major depressive disorder, single episode, unspecified: Secondary | ICD-10-CM | POA: Insufficient documentation

## 2014-12-11 DIAGNOSIS — E669 Obesity, unspecified: Secondary | ICD-10-CM | POA: Insufficient documentation

## 2014-12-11 DIAGNOSIS — Z8249 Family history of ischemic heart disease and other diseases of the circulatory system: Secondary | ICD-10-CM | POA: Insufficient documentation

## 2014-12-11 NOTE — Progress Notes (Signed)
  Medical Nutrition Therapy:  Appt start time: 1100 end time:  1145.  Assessment:  Primary concerns today: Haley BumpsJessica is here today since she started taking Belviq and would like to eat healthier. 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 Has lost about 8 lbs in the past 2 weeks. Would like to get down to 155 lbs. Stopped smoking this past week and using an electronic cigarette. Has a family hx of diabetes and heart disease. Also has a hx of anxiety and depression and gained about 45 lbs in a year and a half when she started Celexa. Is now taking Latuda which is better. Overall is very interested in improving her health.   Works as a Producer, television/film/videohair dresser and lives with her husband and 2 sons. She does most of the food shopping and meal preparation at home. Does not eat breakfast and sometimes will not have lunch until late if she is busy. Eats out about 1 x week. In the past few weeks has stopped getting pizza, chinese take out, etc. Used to eat at night like chocolate or cookies.   Family does not like a lot of vegetables which makes meal prep hard. Is not able to eat as much now on Belviq.  Preferred Learning Style:   No preference indicated   Learning Readiness:   Ready  MEDICATIONS: see list   DIETARY INTAKE:  Usual eating pattern includes 2 meals and 0 snacks per day.   Avoided foods include: Spicy foods  24-hr recall:  B ( AM): none  Snk ( AM): none  L ( PM): side salad with vegetables and thousand island dressing or leftovers (vegetable, meats) small portions Snk ( PM): none or chips and salsa (rare) D ( PM): roast in crock pot with rice or mashed potatoes, corn, vegetables  Snk ( PM): none Beverages: water, 3-4 beers 3-4 x week   Usual physical activity: not active not since she has been having arthritis pain  Estimated energy needs: 1600 calories 180 g carbohydrates 120 g protein 44 g fat  Progress Towards Goal(s):  In progress.   Nutritional Diagnosis:   [place]-3.3 Overweight/obesity As related to hx of large portions, meal skipping, and night eating.  As evidenced by BMI of 31.5.    Intervention:  Nutrition counseling provided. Plan: Aim to eat 3 meals per day and 2 snacks if you are hungry. Have protein and carbohydrate with each meal and snack. For breakfast - try low fat cottage cheese with fruit or eggs and grits.  Or try a Premier Protein Shake and fruit if you are in a hurry. At lunch and dinner, aim to fill half of your plate with vegetables.  Have protein the size of the palm of your hand (3 oz) and starch/carb a quarter of your plate.  Try using small plate.  Make 2 vegetables, starch, and a meat.  Take 20 minutes to eat when possible. Sit at a table with no distractions (TV). Chew well, put your fork between bites.  Continue to pay attention to hunger/fullness feelings.  Look into water fitness classes.  Teaching Method Utilized:  Visual Auditory Hands on  Handouts given during visit include:  Meal Card  MyPlate Handout  15 g CHO Snack  Barriers to learning/adherence to lifestyle change: none  Demonstrated degree of understanding via:  Teach Back   Monitoring/Evaluation:  Dietary intake, exercise, and body weight prn.

## 2014-12-11 NOTE — Patient Instructions (Signed)
Aim to eat 3 meals per day and 2 snacks if you are hungry. Have protein and carbohydrate with each meal and snack. For breakfast - try low fat cottage cheese with fruit or eggs and grits.  Or try a Premier Protein Shake and fruit if you are in a hurry. At lunch and dinner, aim to fill half of your plate with vegetables.  Have protein the size of the palm of your hand (3 oz) and starch/carb a quarter of your plate.  Try using small plate.  Make 2 vegetables, starch, and a meat.  Take 20 minutes to eat when possible. Sit at a table with no distractions (TV). Chew well, put your fork between bites.  Continue to pay attention to hunger/fullness feelings.  Look into water fitness classes.

## 2015-02-11 ENCOUNTER — Ambulatory Visit: Payer: Self-pay | Admitting: Family Medicine

## 2015-03-05 ENCOUNTER — Encounter: Payer: Self-pay | Admitting: *Deleted

## 2015-03-05 ENCOUNTER — Telehealth: Payer: Self-pay | Admitting: Family Medicine

## 2015-03-05 NOTE — Telephone Encounter (Signed)
Tried to call patient but line just rang.  No voicemail options given.  Letter mailed to patient. Jazmin Hartsell,CMA

## 2015-03-05 NOTE — Telephone Encounter (Signed)
Received request refill for Belviq in box. Will not refill at this time as the patient has not had monthly follow up as we discussed at our appointment in March.  [name]. Jimmey RalphParker, MD [facility] Resident PGY-2 03/05/2015 1:50 PM

## 2015-03-07 ENCOUNTER — Other Ambulatory Visit: Payer: Self-pay | Admitting: *Deleted

## 2015-03-07 NOTE — Telephone Encounter (Signed)
Will not refill at this time as the patient has not had monthly follow up as we discussed at our appointment in March. Letter has previously been sent to patient informing her of this.   Katina Degree. Jimmey Ralph, MD [facility] Resident PGY-2 03/07/2015 2:34 PM

## 2015-04-02 ENCOUNTER — Ambulatory Visit: Payer: Self-pay | Admitting: Family Medicine

## 2015-05-06 ENCOUNTER — Ambulatory Visit: Payer: Self-pay | Admitting: Family Medicine

## 2015-12-02 ENCOUNTER — Encounter: Payer: Self-pay | Admitting: Family Medicine

## 2015-12-02 ENCOUNTER — Ambulatory Visit (INDEPENDENT_AMBULATORY_CARE_PROVIDER_SITE_OTHER): Payer: BLUE CROSS/BLUE SHIELD | Admitting: Family Medicine

## 2015-12-02 VITALS — BP 117/70 | HR 87 | Temp 98.4°F | Ht 66.0 in | Wt 154.0 lb

## 2015-12-02 DIAGNOSIS — J029 Acute pharyngitis, unspecified: Secondary | ICD-10-CM | POA: Diagnosis not present

## 2015-12-02 LAB — POCT RAPID STREP A (OFFICE): RAPID STREP A SCREEN: NEGATIVE

## 2015-12-02 NOTE — Patient Instructions (Signed)
Thank you for coming to see me today. It was a pleasure. Today we talked about:   This could be the beginning of a viral infection. Please take over the counter medication if needed. Follow-up if worsening.  If you have any questions or concerns, please do not hesitate to call the office at [phone].  Sincerely,  Jacquelin Hawkingalph Terrionna Bridwell, MD

## 2015-12-02 NOTE — Progress Notes (Signed)
    Subjective   [name] is a 35 y.o. female that presents for a same day visit  1. Sore throat: Symptoms started this morning. She reports headache, ear pressure, sore throat and feeling warm. She also reports a blister in the back of her throat. Sick contact includes her son. She has not received the flu shot this year.  ROS Per HPI  Social History  Substance Use Topics  . Smoking status: Current Every Day Smoker -- 0.50 packs/day for 15 years    Types: Cigarettes  . Smokeless tobacco: Never Used  . Alcohol Use: 1.2 oz/week    2 Cans of beer per week     Comment: hx   alcohol abuse-- per pt has cut back    No Known Allergies  Objective   BP 117/70 mmHg  Pulse 87  Temp(Src) 98.4 F (36.9 C) (Oral)  Ht 5\' 6"  (1.676 m)  Wt 154 lb (69.854 kg)  BMI 24.87 kg/m2  General: Well appearing, no distress HEENT:   Head:  Normocephalic  Eyes: Pupils equal and reactive to light/accomodation. Extraocular movements intact bilaterally.  Ears: Tympanic membranes normal bilaterally.  Nose/Throat: Nares patent bilaterally. Oropharnx without erythema and a tonsillith located on right side.  Neck: No cervical adenopathy bilaterally Respiratory/Chest: Clear to auscultation bilaterally. Unlabored work of breathing. No wheezing or rales. Cardiovascular: Regular rate and rhythm. Normal S1 and S2. No heart murmurs present. No extra heart sounds  Assessment and Plan   1. Sore throat Possibly the beginning of an infection. Too early to tell. Sick contact includes her son. Advised of possible course. OTC medication as needed. Follow-up if worsening. Strep negative. - POCT rapid strep A

## 2016-01-03 DIAGNOSIS — J209 Acute bronchitis, unspecified: Secondary | ICD-10-CM | POA: Diagnosis not present

## 2016-02-13 DIAGNOSIS — M545 Low back pain: Secondary | ICD-10-CM | POA: Diagnosis not present

## 2016-02-13 DIAGNOSIS — M797 Fibromyalgia: Secondary | ICD-10-CM | POA: Diagnosis not present

## 2016-02-13 DIAGNOSIS — M25562 Pain in left knee: Secondary | ICD-10-CM | POA: Diagnosis not present

## 2016-02-13 DIAGNOSIS — M25561 Pain in right knee: Secondary | ICD-10-CM | POA: Diagnosis not present

## 2016-02-24 ENCOUNTER — Encounter: Payer: Self-pay | Admitting: Family Medicine

## 2016-02-24 ENCOUNTER — Ambulatory Visit (INDEPENDENT_AMBULATORY_CARE_PROVIDER_SITE_OTHER): Payer: BLUE CROSS/BLUE SHIELD | Admitting: Family Medicine

## 2016-02-24 ENCOUNTER — Other Ambulatory Visit: Payer: Self-pay | Admitting: Family Medicine

## 2016-02-24 VITALS — BP 126/71 | HR 100 | Temp 97.8°F | Wt 158.0 lb

## 2016-02-24 DIAGNOSIS — N946 Dysmenorrhea, unspecified: Secondary | ICD-10-CM | POA: Diagnosis not present

## 2016-02-24 NOTE — Assessment & Plan Note (Signed)
Will check TVUS to rule out fibroids, adenomyosis, or other structural issues. Attempted to obtain UA and urine GC/CT but patient not able to provide urine sample, Will return to clinic this week to provide this. Patient interested in ablation - discussed with her that this was not a first line treatment and that she would likely need to be on a trial of contraception. Continue ibuprofen as needed. If above work up negative, consider referral to OBGYN for further management.

## 2016-02-24 NOTE — Progress Notes (Signed)
    Subjective:  [name] is a 35 y.o. female who presents [facility] the [facility] today with a chief complaint of dysmenorrhea.   HPI:  Painful Periods. Patient with longstanding history of painful periods for the last 10+ years. Pain became more significant over the past few years. Currently takes high dose ibuprofen which helps with the pain, but says that on the first day of her period, the pain is so bad that she is unable to leave the house. Also has a significant amount of heavy bleeding on the first day and passes some clots. Pain and bleeding improve after a few days. Periods usually last for 4-5 days. No bleeding between periods. No vaginal discharge. No dysuria. LMP about 2 weeks ago. Does not want any form of contraception. Is interested in having an ablation.   ROS: Per HPI  PMH: Smoking history reviewed.   Objective:  Physical Exam: BP 126/71 mmHg  Pulse 100  Temp(Src) 97.8 F (36.6 C) (Oral)  Wt 158 lb (71.668 kg)  SpO2 97%  LMP 02/12/2016  Gen: NAD, resting comfortably CV: RRR with no murmurs appreciated Pulm: NWOB, CTAB with no crackles, wheezes, or rhonchi GI: Normal bowel sounds present. Soft, Nontender, Nondistended. MSK: no edema, cyanosis, or clubbing noted Skin: warm, dry Neuro: grossly normal, moves all extremities Psych: Normal affect and thought content  Assessment/Plan:  Dysmenorrhea Will check TVUS to rule out fibroids, adenomyosis, or other structural issues. Attempted to obtain UA and urine GC/CT but patient not able to provide urine sample, Will return to clinic this week to provide this. Patient interested in ablation - discussed with her that this was not a first line treatment and that she would likely need to be on a trial of contraception. Continue ibuprofen as needed. If above work up negative, consider referral to OBGYN for further management.   Katina Degreealeb M. Jimmey RalphParker, MD [facility] Resident PGY-3 02/24/2016 5:08 PM

## 2016-02-24 NOTE — Patient Instructions (Signed)
We will set you up to have an ultrasound.  Please come back soon for your urine tests.  You will get a call with the results later this week or early next week.  Please come back soon for a regular appointment.  Take care,  Dr Jimmey RalphParker

## 2016-03-09 ENCOUNTER — Ambulatory Visit (HOSPITAL_COMMUNITY): Admission: RE | Admit: 2016-03-09 | Payer: BLUE CROSS/BLUE SHIELD | Source: Ambulatory Visit

## 2016-03-23 DIAGNOSIS — F4311 Post-traumatic stress disorder, acute: Secondary | ICD-10-CM | POA: Diagnosis not present

## 2016-03-23 DIAGNOSIS — F3174 Bipolar disorder, in full remission, most recent episode manic: Secondary | ICD-10-CM | POA: Diagnosis not present

## 2016-03-23 DIAGNOSIS — F410 Panic disorder [episodic paroxysmal anxiety] without agoraphobia: Secondary | ICD-10-CM | POA: Diagnosis not present

## 2016-06-04 DIAGNOSIS — M25562 Pain in left knee: Secondary | ICD-10-CM | POA: Diagnosis not present

## 2016-06-04 DIAGNOSIS — M2391 Unspecified internal derangement of right knee: Secondary | ICD-10-CM | POA: Diagnosis not present

## 2016-06-04 DIAGNOSIS — M797 Fibromyalgia: Secondary | ICD-10-CM | POA: Diagnosis not present

## 2016-06-04 DIAGNOSIS — M7531 Calcific tendinitis of right shoulder: Secondary | ICD-10-CM | POA: Diagnosis not present

## 2016-06-11 DIAGNOSIS — M25561 Pain in right knee: Secondary | ICD-10-CM | POA: Diagnosis not present

## 2016-06-11 DIAGNOSIS — M25562 Pain in left knee: Secondary | ICD-10-CM | POA: Diagnosis not present

## 2016-06-11 DIAGNOSIS — M797 Fibromyalgia: Secondary | ICD-10-CM | POA: Diagnosis not present

## 2016-06-23 DIAGNOSIS — N921 Excessive and frequent menstruation with irregular cycle: Secondary | ICD-10-CM | POA: Diagnosis not present

## 2016-06-29 DIAGNOSIS — N924 Excessive bleeding in the premenopausal period: Secondary | ICD-10-CM | POA: Diagnosis not present

## 2016-06-29 DIAGNOSIS — N946 Dysmenorrhea, unspecified: Secondary | ICD-10-CM | POA: Diagnosis not present

## 2016-06-30 DIAGNOSIS — S83241A Other tear of medial meniscus, current injury, right knee, initial encounter: Secondary | ICD-10-CM | POA: Diagnosis not present

## 2016-06-30 DIAGNOSIS — S83242A Other tear of medial meniscus, current injury, left knee, initial encounter: Secondary | ICD-10-CM | POA: Diagnosis not present

## 2016-07-06 DIAGNOSIS — M25561 Pain in right knee: Secondary | ICD-10-CM | POA: Diagnosis not present

## 2016-08-24 DIAGNOSIS — M25562 Pain in left knee: Secondary | ICD-10-CM | POA: Diagnosis not present

## 2016-08-24 DIAGNOSIS — M797 Fibromyalgia: Secondary | ICD-10-CM | POA: Diagnosis not present

## 2016-08-24 DIAGNOSIS — M25561 Pain in right knee: Secondary | ICD-10-CM | POA: Diagnosis not present

## 2016-08-24 DIAGNOSIS — M7531 Calcific tendinitis of right shoulder: Secondary | ICD-10-CM | POA: Diagnosis not present

## 2016-08-24 DIAGNOSIS — M65311 Trigger thumb, right thumb: Secondary | ICD-10-CM | POA: Diagnosis not present

## 2016-09-14 DIAGNOSIS — F410 Panic disorder [episodic paroxysmal anxiety] without agoraphobia: Secondary | ICD-10-CM | POA: Diagnosis not present

## 2016-09-14 DIAGNOSIS — F3131 Bipolar disorder, current episode depressed, mild: Secondary | ICD-10-CM | POA: Diagnosis not present

## 2016-10-06 DIAGNOSIS — M797 Fibromyalgia: Secondary | ICD-10-CM | POA: Diagnosis not present

## 2016-10-06 DIAGNOSIS — M7531 Calcific tendinitis of right shoulder: Secondary | ICD-10-CM | POA: Diagnosis not present

## 2016-10-06 DIAGNOSIS — M25562 Pain in left knee: Secondary | ICD-10-CM | POA: Diagnosis not present

## 2016-10-06 DIAGNOSIS — M25561 Pain in right knee: Secondary | ICD-10-CM | POA: Diagnosis not present

## 2016-10-20 DIAGNOSIS — J029 Acute pharyngitis, unspecified: Secondary | ICD-10-CM | POA: Diagnosis not present

## 2016-10-20 DIAGNOSIS — J00 Acute nasopharyngitis [common cold]: Secondary | ICD-10-CM | POA: Diagnosis not present

## 2016-10-20 DIAGNOSIS — J111 Influenza due to unidentified influenza virus with other respiratory manifestations: Secondary | ICD-10-CM | POA: Diagnosis not present

## 2016-10-20 DIAGNOSIS — H9201 Otalgia, right ear: Secondary | ICD-10-CM | POA: Diagnosis not present

## 2016-10-22 DIAGNOSIS — F17200 Nicotine dependence, unspecified, uncomplicated: Secondary | ICD-10-CM | POA: Diagnosis not present

## 2016-10-22 DIAGNOSIS — R112 Nausea with vomiting, unspecified: Secondary | ICD-10-CM | POA: Diagnosis not present

## 2016-10-22 DIAGNOSIS — M1990 Unspecified osteoarthritis, unspecified site: Secondary | ICD-10-CM | POA: Diagnosis not present

## 2016-10-22 DIAGNOSIS — F418 Other specified anxiety disorders: Secondary | ICD-10-CM | POA: Diagnosis not present

## 2016-10-22 DIAGNOSIS — J111 Influenza due to unidentified influenza virus with other respiratory manifestations: Secondary | ICD-10-CM | POA: Diagnosis not present

## 2016-10-22 DIAGNOSIS — R05 Cough: Secondary | ICD-10-CM | POA: Diagnosis not present

## 2016-10-22 DIAGNOSIS — R1011 Right upper quadrant pain: Secondary | ICD-10-CM | POA: Diagnosis not present

## 2016-10-22 DIAGNOSIS — K838 Other specified diseases of biliary tract: Secondary | ICD-10-CM | POA: Diagnosis not present

## 2016-10-22 DIAGNOSIS — F17210 Nicotine dependence, cigarettes, uncomplicated: Secondary | ICD-10-CM | POA: Diagnosis not present

## 2016-10-22 DIAGNOSIS — R0789 Other chest pain: Secondary | ICD-10-CM | POA: Diagnosis not present

## 2016-10-22 DIAGNOSIS — N83202 Unspecified ovarian cyst, left side: Secondary | ICD-10-CM | POA: Diagnosis not present

## 2016-10-22 DIAGNOSIS — R1013 Epigastric pain: Secondary | ICD-10-CM | POA: Diagnosis not present

## 2016-10-22 DIAGNOSIS — K2970 Gastritis, unspecified, without bleeding: Secondary | ICD-10-CM | POA: Diagnosis not present

## 2016-10-22 DIAGNOSIS — Z79899 Other long term (current) drug therapy: Secondary | ICD-10-CM | POA: Diagnosis not present

## 2017-03-02 DIAGNOSIS — F310 Bipolar disorder, current episode hypomanic: Secondary | ICD-10-CM | POA: Diagnosis not present

## 2017-03-02 DIAGNOSIS — F410 Panic disorder [episodic paroxysmal anxiety] without agoraphobia: Secondary | ICD-10-CM | POA: Diagnosis not present

## 2017-03-22 ENCOUNTER — Other Ambulatory Visit: Payer: Self-pay | Admitting: Internal Medicine

## 2017-03-22 ENCOUNTER — Telehealth: Payer: Self-pay | Admitting: Internal Medicine

## 2017-03-22 DIAGNOSIS — M2550 Pain in unspecified joint: Secondary | ICD-10-CM

## 2017-03-22 NOTE — Telephone Encounter (Signed)
Will forward to MD to place a new referral since it has been over a year since the initial referral was placed. Jazmin Hartsell,CMA

## 2017-03-22 NOTE — Telephone Encounter (Signed)
Pt needs a new referral for rheumatologist, since her rheumatologist retired. Pt would [facility] to go [facility] [place] ep

## 2017-03-22 NOTE — Telephone Encounter (Signed)
Referral placed.

## 2017-04-20 DIAGNOSIS — M25561 Pain in right knee: Secondary | ICD-10-CM | POA: Diagnosis not present

## 2017-04-20 DIAGNOSIS — M47897 Other spondylosis, lumbosacral region: Secondary | ICD-10-CM | POA: Diagnosis not present

## 2017-04-20 DIAGNOSIS — M4186 Other forms of scoliosis, lumbar region: Secondary | ICD-10-CM | POA: Diagnosis not present

## 2017-04-20 DIAGNOSIS — G8929 Other chronic pain: Secondary | ICD-10-CM | POA: Diagnosis not present

## 2017-04-20 DIAGNOSIS — Z79891 Long term (current) use of opiate analgesic: Secondary | ICD-10-CM | POA: Diagnosis not present

## 2017-04-20 DIAGNOSIS — M7989 Other specified soft tissue disorders: Secondary | ICD-10-CM | POA: Diagnosis not present

## 2017-04-20 DIAGNOSIS — M797 Fibromyalgia: Secondary | ICD-10-CM | POA: Diagnosis not present

## 2017-04-20 DIAGNOSIS — M545 Low back pain: Secondary | ICD-10-CM | POA: Diagnosis not present

## 2017-04-20 DIAGNOSIS — M2550 Pain in unspecified joint: Secondary | ICD-10-CM | POA: Diagnosis not present

## 2017-04-20 DIAGNOSIS — M25562 Pain in left knee: Secondary | ICD-10-CM | POA: Diagnosis not present

## 2017-06-02 ENCOUNTER — Ambulatory Visit (INDEPENDENT_AMBULATORY_CARE_PROVIDER_SITE_OTHER): Payer: BLUE CROSS/BLUE SHIELD | Admitting: Internal Medicine

## 2017-06-02 ENCOUNTER — Encounter: Payer: Self-pay | Admitting: Internal Medicine

## 2017-06-02 VITALS — BP 112/68 | HR 103 | Temp 99.1°F | Ht 66.0 in | Wt 159.0 lb

## 2017-06-02 DIAGNOSIS — R112 Nausea with vomiting, unspecified: Secondary | ICD-10-CM | POA: Diagnosis not present

## 2017-06-02 DIAGNOSIS — K2950 Unspecified chronic gastritis without bleeding: Secondary | ICD-10-CM | POA: Diagnosis not present

## 2017-06-02 DIAGNOSIS — R1011 Right upper quadrant pain: Secondary | ICD-10-CM | POA: Diagnosis not present

## 2017-06-02 DIAGNOSIS — R109 Unspecified abdominal pain: Secondary | ICD-10-CM

## 2017-06-02 LAB — POCT URINALYSIS DIP (MANUAL ENTRY)
BILIRUBIN UA: NEGATIVE
BILIRUBIN UA: NEGATIVE mg/dL
Glucose, UA: NEGATIVE mg/dL
Leukocytes, UA: NEGATIVE
Nitrite, UA: NEGATIVE
PH UA: 7 (ref 5.0–8.0)
Protein Ur, POC: NEGATIVE mg/dL
RBC UA: NEGATIVE
Spec Grav, UA: 1.01 (ref 1.010–1.025)
Urobilinogen, UA: 0.2 E.U./dL

## 2017-06-02 LAB — POCT URINE PREGNANCY: PREG TEST UR: NEGATIVE

## 2017-06-02 MED ORDER — ONDANSETRON HCL 4 MG PO TABS: 4.0000 mg | ORAL_TABLET | Freq: Three times a day (TID) | ORAL | 0 refills | Status: DC | PRN

## 2017-06-02 NOTE — Patient Instructions (Addendum)
We will get blood work today. I will call you with the results I ordered an Ultrasound to evaluate for gall stones  I also made a referral to the GI doctors. Please call us if you do not hear from them in about 1.5 weeks.   Please seek immediate medical attention if your symptoms worsen.

## 2017-06-02 NOTE — Progress Notes (Signed)
   [facility] Phone: [phone]   Date of Visit: 06/02/2017   HPI:  Nausea/Vomiting:  - reports of intermittent Nausea and vomiting over the past 4 weeks  - reports it comes on randomly and she has to vomit - occurs mainly once or twice a day but not quite daily - no blood in vomit - no fevers or chills  - initially thought it might be due to stomach bug but since it has been going on for this long she wanted to come in for evaluation  - she denies abdominal pain other than the feeling she gets when she is "sick to her stomach" - has a history of gastritis but reports this is not similar to her prior symptoms - reports of loose stools, nonbloody - no sick contacts  - symptoms are not really associated with meals  - denies dysuria, urinary frequency, blood in urine.  - no aggravating factors, no alleviating factors  - reports of decreased appetite. No unintentional weight loss - reports she has a history of alcohol abuse but has cut back drastically. She reports she drinks one a week on the weekend. Reports she may have 6-7 beers at that time.  - LMP 04/29/17 which was lighter than her usual periods. She is sexually active. She has had a tubal ligation. Denies vaginal discharge/itching.  - history of appendectomy.  CT abd/pelivs 10/2016:  "1.Suspected gastritis. 2.Mild diffuse thickening of the distal esophageal walls which may represent reflux esophagitis changes. I am unable to exclude Barrett's esophagus."  ROS: See HPI.  PMFSH:  PMH: Alcohol Abuse Cannabis Abuse Tobacco Use GAD PTSD Depression  PHYSICAL EXAM: BP 112/68   Pulse (!) 103   Temp 99.1 F (37.3 C) (Oral)   Ht 5\' 6"  (1.676 m)   Wt 159 lb (72.1 kg)   LMP 04/30/2017 (Exact Date)   SpO2 98%   BMI 25.66 kg/m  Gen: NAD, well appearing, nontoxic HEENT: moist mucous membranes Heart: RRR. No m/r/g Lungs: CTAB, normal effort Abdomen: soft, mild tenderness in the lateral aspect  of the RUQ. Negative murphy sign. + bowel sounds.  Neuro: grossly intact Ext: no LE swelling  ASSESSMENT/PLAN:  Intermittent Nausea with Vomiting and RUQ tenderness: patient denied abdominal pain but reported tenderness with palpation of her abdomen as noted in the exam. Urine pregnancy test is negative. Unclear in etiology. Possibly gall bladder pathology although patient does not associate with meals. She has a history of gastritis and her imaging in March showed some mild diffuse thickening of distal esophageal walls (unable to exclude Barretts); possibly contributing to her symptoms. Unlikely pancreatitis.  - CMP - RUQ US ordered for tomorrow - PRN Zofran ordered - GI referral for likely EGD for further evaluation of thickening of distal esophagus  - return precautions/ED precautions discussed.     Palma HolterKanishka G Audree Schrecengost, MD PGY 3 [facility]

## 2017-06-03 ENCOUNTER — Ambulatory Visit
Admission: RE | Admit: 2017-06-03 | Discharge: 2017-06-03 | Disposition: A | Discharge status: Home / Self Care | Payer: BLUE CROSS/BLUE SHIELD | Source: Ambulatory Visit | Attending: Family Medicine | Admitting: Family Medicine

## 2017-06-03 DIAGNOSIS — R1011 Right upper quadrant pain: Secondary | ICD-10-CM | POA: Diagnosis not present

## 2017-06-03 LAB — CMP14+EGFR
ALK PHOS: 67 IU/L (ref 39–117)
ALT: 22 IU/L (ref 0–32)
AST: 19 IU/L (ref 0–40)
Albumin/Globulin Ratio: 1.7 (ref 1.2–2.2)
Albumin: 4.6 g/dL (ref 3.5–5.5)
BILIRUBIN TOTAL: 0.4 mg/dL (ref 0.0–1.2)
BUN / CREAT RATIO: 10 (ref 9–23)
BUN: 8 mg/dL (ref 6–20)
CHLORIDE: 101 mmol/L (ref 96–106)
CO2: 25 mmol/L (ref 20–29)
CREATININE: 0.84 mg/dL (ref 0.57–1.00)
Calcium: 9.4 mg/dL (ref 8.7–10.2)
GFR calc Af Amer: 102 mL/min/{1.73_m2} (ref >59)
GFR calc non Af Amer: 88 mL/min/{1.73_m2} (ref >59)
GLUCOSE: 98 mg/dL (ref 65–99)
Globulin, Total: 2.7 g/dL (ref 1.5–4.5)
POTASSIUM: 4.4 mmol/L (ref 3.5–5.2)
Sodium: 139 mmol/L (ref 134–144)
Total Protein: 7.3 g/dL (ref 6.0–8.5)

## 2017-06-04 ENCOUNTER — Encounter: Payer: Self-pay | Admitting: Gastroenterology

## 2017-06-04 ENCOUNTER — Telehealth: Payer: Self-pay | Admitting: Internal Medicine

## 2017-06-04 NOTE — Telephone Encounter (Signed)
Patient is aware of results and states that she has her GI appointment in a week. Sheryl Saintil,CMA

## 2017-06-04 NOTE — Telephone Encounter (Signed)
Please inform patient that her ultrasound of the right upper abdomen was normal (no gall stones). (Please ensure she is reached by phone as she was concerned about this). Additionally, her liver function, kidney function, and electrolytes are all normal. I believe the next step is evaluation by the GI doctors. I can call her on Monday if she has any questions.

## 2017-06-11 ENCOUNTER — Other Ambulatory Visit: Payer: BLUE CROSS/BLUE SHIELD

## 2017-06-11 ENCOUNTER — Encounter: Payer: Self-pay | Admitting: Gastroenterology

## 2017-06-11 ENCOUNTER — Ambulatory Visit (INDEPENDENT_AMBULATORY_CARE_PROVIDER_SITE_OTHER): Payer: BLUE CROSS/BLUE SHIELD | Admitting: Gastroenterology

## 2017-06-11 VITALS — BP 118/74 | HR 96 | Ht 66.0 in | Wt 163.6 lb

## 2017-06-11 DIAGNOSIS — R110 Nausea: Secondary | ICD-10-CM

## 2017-06-11 DIAGNOSIS — R1011 Right upper quadrant pain: Secondary | ICD-10-CM

## 2017-06-11 DIAGNOSIS — R1013 Epigastric pain: Secondary | ICD-10-CM

## 2017-06-11 DIAGNOSIS — R9389 Abnormal findings on diagnostic imaging of other specified body structures: Secondary | ICD-10-CM | POA: Diagnosis not present

## 2017-06-11 MED ORDER — ONDANSETRON 4 MG PO TBDP: 4.0000 mg | ORAL_TABLET | Freq: Three times a day (TID) | ORAL | 2 refills | Status: DC | PRN

## 2017-06-11 MED ORDER — OMEPRAZOLE 40 MG PO CPDR: 40.0000 mg | DELAYED_RELEASE_CAPSULE | Freq: Every day | ORAL | 2 refills | Status: DC

## 2017-06-11 NOTE — Progress Notes (Addendum)
06/11/2017 [name] [id number] May 19, 1981   HISTORY OF PRESENT ILLNESS:  This is a 36 year old female who is new to our practice.  She presents to our office today at the request of Dr. Ottie Glazier for evaluation regarding several GI complaints.  She tells me that she has just felt terrible for the past 5-6 weeks.  She complains of abdominal pain both in her epigastrium and her RUQ that goes around to the right side of her back.  She has daily nausea and has had a few random episodes of vomiting.  She says that she's been having loose stools, sometimes 3-4 times per day.  Does not see blood in her stools or have black stools.  She says that yesterday was the first normal BM in about 5 weeks.  All of these symptoms started about the same time.  Also reports intermittent issues with swallowing solid foods at times.  Has diffuse abdominal bloating.  She is concerned that she is pregnant because she says that she feels the same way that she did when she was pregnant in the past.  She had a negative urin pregnancy last week and she has a history of tubal ligation.  Had an abdominal ultrasound just a few days ago that was normal.    She was actually seen for abdominal pain in March of this year and had a CT scan of the abdomen and pelvis with contrast that showed the following:  IMPRESSION:  1.Suspected gastritis. 2.Mild diffuse thickening of the distal esophageal walls which may represent reflux esophagitis changes. I am unable to exclude Barrett's esophagus. 3.Negative for bowel obstruction. 4.Appendectomy. 5.Small 2.1 cm left ovarian cyst, associated with a small amount of fluid in the pelvis, likely physiologic in etiology.  Has some zofran that she has been taking but says that phenergan has worked better for her in the past.   Past Medical History:  Diagnosis Date  . Alcohol abuse   . History of cervical dysplasia    CIN I  in  2004  . Inflamed external hemorrhoid     . Inflamed internal hemorrhoid   . Major depression   . PTSD (post-traumatic stress disorder)   . Rectal pain   . Scoliosis 11/12/2012   Past Surgical History:  Procedure Laterality Date  . APPENDECTOMY  1994  . CERVICAL BIOPSY  W/ LOOP ELECTRODE EXCISION  06-11-2003  . EVALUATION UNDER ANESTHESIA WITH HEMORRHOIDECTOMY N/A 01/22/2014   Procedure: EXAM UNDER ANESTHESIA WITH HEMORRHOIDECTOMY;  Surgeon: Romie Levee, MD;  Location[facility];  Service: General;  Laterality: N/A;  . TUBAL LIGATION  05-08-2005   W/  FILSHIE CLIPS    reports that she has been smoking Cigarettes.  She has a 7.50 pack-year smoking history. She has never used smokeless tobacco. She reports that she drinks about 1.2 oz of alcohol per week . She reports that she uses drugs, including Marijuana. family history includes Cancer in her father, maternal grandfather, maternal uncle, paternal aunt, paternal aunt, paternal grandfather, and sister; Lung disease in her mother. No Known Allergies    Outpatient Encounter Prescriptions as of 06/11/2017  Medication Sig  . ALPRAZolam (XANAX) 1 MG tablet Take 1 mg by mouth 4 (four) times daily as needed.   . gabapentin (NEURONTIN) 100 MG capsule Take 100 mg by mouth 2 (two) times daily.  Marland Kitchen lamoTRIgine (LAMICTAL) 150 MG tablet Take 300 mg by mouth at bedtime.  . ondansetron (ZOFRAN) 4 MG tablet  Take 1 tablet (4 mg total) by mouth every 8 (eight) hours as needed for nausea or vomiting.  . [DISCONTINUED] Lorcaserin HCl (BELVIQ) 10 MG TABS Take 10 mg by mouth 2 (two) times daily.  . [DISCONTINUED] Lurasidone HCl 60 MG TABS Take by mouth.   Facility-Administered Encounter Medications as of 06/11/2017  Medication  . naproxen (NAPROSYN) tablet 375 mg     REVIEW OF SYSTEMS  : All other systems reviewed and negative except where noted in the History of Present Illness.   PHYSICAL EXAM: BP 118/74   Pulse 96   Ht 5\' 6"  (1.676 m)   Wt 163 lb 9.6 oz (74.2 kg)    LMP 05/01/2017   BMI 26.41 kg/m  General: Well developed white female in no acute distress Head: Normocephalic and atraumatic Eyes:  Sclerae anicteric, conjunctiva pink. Ears: Normal auditory acuity. Lungs: Clear throughout to auscultation; no increased WOB Heart: Regular rate and rhythm; no M/R/G. Abdomen: Soft, non-distended.  BS present.  Non-tender. Musculoskeletal: Symmetrical with no gross deformities  Skin: No lesions on visible extremities Extremities: No edema.  Neurological: Alert oriented x 4, grossly non-focal Psychological:  Alert and cooperative. Normal mood and affect  ASSESSMENT AND PLAN: *36 year old female with multiple GI complaints including epigastric abdominal pain, RUQ abdominal pain that radiates to her back, dysphagia, nausea, vomiting, and some loose stools.  At this time I am unsure what is causing her symptoms.  She is concerned that she may be pregnant (although she had a tubal ligation) so that needs to be ruled out as cause of symptoms and in order to plan for other treatment/testing.  Will order serum HCG.  If that is negative then will plan to proceed with EGD and HIDA.  CT scan earlier this year suggested gastritis and mild diffuse thickening of the distal esophagus.  I am going to place her on omeprazole 40 mg daily.  Will renew zofran prescription and if pregnancy is negative then will give some phenergan since she says that has worked better in the past.  **The risks, benefits, and alternatives to EGD were discussed with the patient and she consents to proceed.   CC:  Palma HolterGunadasa, Kanishka G, MD   Addendum: Reviewed and agree with initial management. Pyrtle, Carie CaddyJay M, MD

## 2017-06-11 NOTE — Patient Instructions (Addendum)
You have been scheduled for an endoscopy. Please follow written instructions given to you at your visit today. If you use inhalers (even only as needed), please bring them with you on the day of your procedure. Your physician has requested that you go to www.startemmi.com and enter the access code given to you at your visit today. This web site gives a general overview about your procedure. However, you should still follow specific instructions given to you by our office regarding your preparation for the procedure.  We have sent the following medications to your pharmacy for you to pick up at your convenience:  Zofran 4 mg every 8 hours as needed for nausea  Omeprazole 40 mg daily.   Your physician has requested that you go to the basement for lab work before leaving today.  You have been scheduled for a HIDA scan at [facility] (1st floor) on 06-28-17. Please arrive 15 minutes prior to your scheduled appointment at  7:30 am. Make certain not to have anything to eat or drink at least 6 hours prior to your test. Should this appointment date or time not work well for you, please call radiology scheduling at [phone].  _____________________________________________________________________ hepatobiliary (HIDA) scan is an imaging procedure used to diagnose problems in the liver, gallbladder and bile ducts. In the HIDA scan, a radioactive chemical or tracer is injected into a vein in your arm. The tracer is handled by the liver like bile. Bile is a fluid produced and excreted by your liver that helps your digestive system break down fats in the foods you eat. Bile is stored in your gallbladder and the gallbladder releases the bile when you eat a meal. A special nuclear medicine scanner (gamma camera) tracks the flow of the tracer from your liver into your gallbladder and small intestine.  During your HIDA scan  You'll be asked to change into a hospital gown before your HIDA scan begins. Your  health care team will position you on a table, usually on your back. The radioactive tracer is then injected into a vein in your arm.The tracer travels through your bloodstream to your liver, where it's taken up by the bile-producing cells. The radioactive tracer travels with the bile from your liver into your gallbladder and through your bile ducts to your small intestine.You may feel some pressure while the radioactive tracer is injected into your vein. As you lie on the table, a special gamma camera is positioned over your abdomen taking pictures of the tracer as it moves through your body. The gamma camera takes pictures continually for about an hour. You'll need to keep still during the HIDA scan. This can become uncomfortable, but you may find that you can lessen the discomfort by taking deep breaths and thinking about other things. Tell your health care team if you're uncomfortable. The radiologist will watch on a computer the progress of the radioactive tracer through your body. The HIDA scan may be stopped when the radioactive tracer is seen in the gallbladder and enters your small intestine. This typically takes about an hour. In some cases extra imaging will be performed if original images aren't satisfactory, if morphine is given to help visualize the gallbladder or if the medication CCK is given to look at the contraction of the gallbladder. This test typically takes 2 hours to complete. ________________________________________________________________________

## 2017-06-12 LAB — HCG, SERUM, QUALITATIVE: PREG SERUM: NEGATIVE

## 2017-06-14 ENCOUNTER — Telehealth: Payer: Self-pay | Admitting: Gastroenterology

## 2017-06-14 ENCOUNTER — Other Ambulatory Visit: Payer: Self-pay | Admitting: Internal Medicine

## 2017-06-14 MED ORDER — ONDANSETRON 4 MG PO TBDP: 4.0000 mg | ORAL_TABLET | Freq: Three times a day (TID) | ORAL | 2 refills | Status: DC | PRN

## 2017-06-14 MED ORDER — OMEPRAZOLE 40 MG PO CPDR: 40.0000 mg | DELAYED_RELEASE_CAPSULE | Freq: Every day | ORAL | 2 refills | Status: DC

## 2017-06-14 NOTE — Telephone Encounter (Signed)
Spoke to patient and informed her that Rx have been sent to [facility].

## 2017-06-15 ENCOUNTER — Encounter: Payer: Self-pay | Admitting: Gastroenterology

## 2017-06-15 ENCOUNTER — Encounter: Payer: Self-pay | Admitting: Internal Medicine

## 2017-06-15 DIAGNOSIS — R1013 Epigastric pain: Secondary | ICD-10-CM | POA: Insufficient documentation

## 2017-06-15 DIAGNOSIS — R9389 Abnormal findings on diagnostic imaging of other specified body structures: Secondary | ICD-10-CM | POA: Insufficient documentation

## 2017-06-15 DIAGNOSIS — R1011 Right upper quadrant pain: Secondary | ICD-10-CM | POA: Insufficient documentation

## 2017-06-15 DIAGNOSIS — R110 Nausea: Secondary | ICD-10-CM | POA: Insufficient documentation

## 2017-06-28 ENCOUNTER — Encounter (HOSPITAL_COMMUNITY)
Admission: RE | Admit: 2017-06-28 | Discharge: 2017-06-28 | Disposition: A | Discharge status: Home / Self Care | Payer: BLUE CROSS/BLUE SHIELD | Source: Ambulatory Visit | Attending: Gastroenterology | Admitting: Gastroenterology

## 2017-06-28 DIAGNOSIS — R1011 Right upper quadrant pain: Secondary | ICD-10-CM | POA: Diagnosis not present

## 2017-06-28 DIAGNOSIS — R932 Abnormal findings on diagnostic imaging of liver and biliary tract: Secondary | ICD-10-CM | POA: Diagnosis not present

## 2017-06-28 MED ORDER — TECHNETIUM TC 99M MEBROFENIN IV KIT
5.1000 | PACK | Freq: Once | INTRAVENOUS | Status: AC | PRN
  Administered 2017-06-28: 5.1 via INTRAVENOUS

## 2017-06-29 ENCOUNTER — Other Ambulatory Visit: Payer: Self-pay

## 2017-06-29 ENCOUNTER — Ambulatory Visit (AMBULATORY_SURGERY_CENTER): Payer: BLUE CROSS/BLUE SHIELD | Admitting: Internal Medicine

## 2017-06-29 ENCOUNTER — Encounter: Payer: Self-pay | Admitting: Internal Medicine

## 2017-06-29 VITALS — BP 129/62 | HR 76 | Temp 99.3°F | Resp 16 | Ht 66.0 in | Wt 163.0 lb

## 2017-06-29 DIAGNOSIS — R110 Nausea: Secondary | ICD-10-CM

## 2017-06-29 DIAGNOSIS — K2990 Gastroduodenitis, unspecified, without bleeding: Secondary | ICD-10-CM | POA: Diagnosis not present

## 2017-06-29 DIAGNOSIS — K2970 Gastritis, unspecified, without bleeding: Secondary | ICD-10-CM

## 2017-06-29 DIAGNOSIS — K2980 Duodenitis without bleeding: Secondary | ICD-10-CM | POA: Diagnosis not present

## 2017-06-29 DIAGNOSIS — R1013 Epigastric pain: Secondary | ICD-10-CM | POA: Diagnosis not present

## 2017-06-29 DIAGNOSIS — K2950 Unspecified chronic gastritis without bleeding: Secondary | ICD-10-CM | POA: Diagnosis not present

## 2017-06-29 MED ORDER — SODIUM CHLORIDE 0.9 % IV SOLN: 500.0000 mL | INTRAVENOUS | Status: DC

## 2017-06-29 MED ORDER — OMEPRAZOLE 40 MG PO CPDR: DELAYED_RELEASE_CAPSULE | ORAL | 2 refills | Status: DC

## 2017-06-29 NOTE — Progress Notes (Signed)
Report given to PACU, vss 

## 2017-06-29 NOTE — Patient Instructions (Addendum)
   AVOID ASPIRIN,IBUPROFEN,NAPROXEN,OR OTHER NON STEROIDAL ANTI INFLAMMATORY DRUGS   AVOID ALL ALCOHOL  INCREASE OMEPRAZOLE TO 40 MG TWICE DAILY FOR ONE MONTH,THEN DAILY THEREAFTER- CHANGED RX-TO [facility]   INFORMATION ON GASTRITIS GIVEN TPO YOU  AWAIT BIOPSY RESULTS    YOU HAD AN ENDOSCOPIC PROCEDURE TODAY AT THE [facility]:   Refer to the procedure report that was given to you for any specific questions about what was found during the examination.  If the procedure report does not answer your questions, please call your gastroenterologist to clarify.  If you requested that your care partner not be given the details of your procedure findings, then the procedure report has been included in a sealed envelope for you to review at your convenience later.  YOU SHOULD EXPECT: Some feelings of bloating in the abdomen. Passage of more gas than usual.  Walking can help get rid of the air that was put into your GI tract during the procedure and reduce the bloating. If you had a lower endoscopy (such as a colonoscopy or flexible sigmoidoscopy) you may notice spotting of blood in your stool or on the toilet paper. If you underwent a bowel prep for your procedure, you may not have a normal bowel movement for a few days.  Please Note:  You might notice some irritation and congestion in your nose or some drainage.  This is from the oxygen used during your procedure.  There is no need for concern and it should clear up in a day or so.  SYMPTOMS TO REPORT IMMEDIATELY:     Following upper endoscopy (EGD)  Vomiting of blood or coffee ground material  New chest pain or pain under the shoulder blades  Painful or persistently difficult swallowing  New shortness of breath  Fever of 100F or higher  Black, tarry-looking stools  For urgent or emergent issues, a gastroenterologist can be reached at any hour by calling [phone].   DIET:  We do recommend a small meal at  first, but then you may proceed to your regular diet.  Drink plenty of fluids but you should avoid alcoholic beverages for 24 hours.  ACTIVITY:  You should plan to take it easy for the rest of today and you should NOT DRIVE or use heavy machinery until tomorrow (because of the sedation medicines used during the test).    FOLLOW UP: Our staff will call the number listed on your records the next business day following your procedure to check on you and address any questions or concerns that you may have regarding the information given to you following your procedure. If we do not reach you, we will leave a message.  However, if you are feeling well and you are not experiencing any problems, there is no need to return our call.  We will assume that you have returned to your regular daily activities without incident.  If any biopsies were taken you will be contacted by phone or by letter within the next 1-3 weeks.  Please call us at [phone] if you have not heard about the biopsies in 3 weeks.    SIGNATURES/CONFIDENTIALITY: You and/or your care partner have signed paperwork which will be entered into your electronic medical record.  These signatures attest to the fact that that the information above on your After Visit Summary has been reviewed and is understood.  Full responsibility of the confidentiality of this discharge information lies with you and/or your care-partner.

## 2017-06-29 NOTE — Op Note (Signed)
[facility] Patient Name: [name] Procedure Date: 06/29/2017 9:50 AM MRN: [id number] Endoscopist: Beverley Fiedler , MD Age: 36 Referring MD:  Date of Birth: May 19, 1981 Gender: Female Account #: [id number] Procedure:                Upper GI endoscopy Indications:              Epigastric abdominal pain, Nausea Medicines:                Monitored Anesthesia Care Procedure:                Pre-Anesthesia Assessment:                           - Prior to the procedure, a History and Physical                            was performed, and patient medications and                            allergies were reviewed. The patient's tolerance of                            previous anesthesia was also reviewed. The risks                            and benefits of the procedure and the sedation                            options and risks were discussed with the patient.                            All questions were answered, and informed consent                            was obtained. Prior Anticoagulants: The patient has                            taken no previous anticoagulant or antiplatelet                            agents. ASA Grade Assessment: II - A patient with                            mild systemic disease. After reviewing the risks                            and benefits, the patient was deemed in                            satisfactory condition to undergo the procedure.                           After obtaining informed consent, the endoscope was  passed under direct vision. Throughout the                            procedure, the patient's blood pressure, pulse, and                            oxygen saturations were monitored continuously. The                            Endoscope was introduced through the mouth, and                            advanced to the second part of duodenum. The upper                            GI endoscopy was  accomplished without difficulty.                            The patient tolerated the procedure well. Scope In: Scope Out: Findings:                 The examined esophagus was normal.                           One non-bleeding linear gastric ulcer was found in                            the gastric antrum. The lesion was 6 mm in largest                            dimension. Biopsies were taken with a cold forceps                            for histology and Helicobacter pylori testing.                           Diffuse moderate inflammation characterized by                            adherent blood and erythema was found at the                            incisura and in the gastric antrum. Biopsies were                            taken with a cold forceps for histology and                            Helicobacter pylori testing (body, antrum,                            incisura).                           The cardia and  gastric fundus were normal on                            retroflexion.                           The examined duodenum was normal. Biopsies for                            histology were taken with a cold forceps for                            evaluation of celiac disease. Complications:            No immediate complications. Estimated Blood Loss:     Estimated blood loss was minimal. Impression:               - Normal esophagus.                           - Gastritis with gastric ulcer in antrum.Marland Kitchen. Biopsied.                           - Normal examined duodenum. Biopsied. Recommendation:           - Patient has a contact number available for                            emergencies. The signs and symptoms of potential                            delayed complications were discussed with the                            patient. Return to normal activities tomorrow.                            Written discharge instructions were provided to the                            patient.                            - Resume previous diet.                           - Continue present medications, though increase                            omeprazole to 40 mg twice daily x 1 month, then                            daily thereafter.                           - Avoid aspirin, ibuprofen, naproxen, or other  non-steroidal anti-inflammatory drugs. Avoid all                            alcohol.                           - Await pathology results.                           - Office follow-up if symptoms persist. Beverley Fiedler, MD 06/29/2017 10:11:14 AM This report has been signed electronically.

## 2017-06-29 NOTE — Progress Notes (Signed)
Pt's states no medical or surgical changes since previsit or office visit. 

## 2017-06-29 NOTE — Progress Notes (Signed)
Called to room to assist during endoscopic procedure.  Patient ID and intended procedure confirmed with present staff. Received instructions for my participation in the procedure from the performing physician.  

## 2017-06-30 ENCOUNTER — Telehealth: Payer: Self-pay

## 2017-06-30 ENCOUNTER — Telehealth: Payer: Self-pay | Admitting: Internal Medicine

## 2017-06-30 MED ORDER — SUCRALFATE 1 G PO TABS: 1.0000 g | ORAL_TABLET | Freq: Four times a day (QID) | ORAL | 1 refills | Status: DC

## 2017-06-30 NOTE — Telephone Encounter (Signed)
  Follow up Call-  Call back number 06/29/2017  Post procedure Call Back phone  # [phone]  Permission to leave phone message Yes  Some recent data might be hidden     Patient questions:  Do you have a fever, pain , or abdominal swelling? Yes.   Pain Score  6 *  Have you tolerated food without any problems? Yes.    Have you been able to return to your normal activities? Yes.    Do you have any questions about your discharge instructions: Diet   No. Medications  No. Follow up visit  No.  Do you have questions or concerns about your Care? Yes.  Patient had upper GI yesterday. C/o pain 6/10 under rib cage medially. Describes as intermittently stabbing and aching. States pain is increased when taking a deep breath when asked. States she has been passing some air and does not feel like she is bloated. Patient ate a baked potato last evening w/o n/v. Patient states her pain is similar to when she went to the hospital with gastritis, but not as bad. Advised patient that I will consult with Dr. Rhea BeltonPyrtle and check back with her later this morning. Patient verbalizes understanding and states she is going to work, but will have her cell phone with her.  Actions: * If pain score is 4 or above: Physician/ provider Notified : Erick BlinksJay Pyrtle, MD.

## 2017-06-30 NOTE — Telephone Encounter (Signed)
Received advice from Dr. Leone PayorGessner regarding previous call. I told patient that Dr. Leone PayorGessner would like to try an additional medication called Carafate. Confirmed with patient that her preferred pharmacy is [facility]. RX for Carafate sent to this pharmacy per Dr. Marvell FullerGessner's order. Patient verbalizes understanding and will contact us if she does not have relief.

## 2017-06-30 NOTE — Telephone Encounter (Signed)
Let's add carafate 1 g qid # 120 1 refill

## 2017-07-01 ENCOUNTER — Telehealth: Payer: Self-pay | Admitting: Internal Medicine

## 2017-07-01 NOTE — Telephone Encounter (Signed)
[name] pt that had EGD done yesterday. Stomach is still uncomfortable and pt is requesting a prescription for a GI cocktail to "settle her stomach." As DOD please advise.

## 2017-07-01 NOTE — Telephone Encounter (Signed)
Okay for GI cocktail

## 2017-07-02 ENCOUNTER — Telehealth: Payer: Self-pay | Admitting: Internal Medicine

## 2017-07-02 MED ORDER — GI COCKTAIL ~~LOC~~: 30.0000 mL | Freq: Two times a day (BID) | ORAL | 0 refills | Status: DC | PRN

## 2017-07-02 NOTE — Telephone Encounter (Signed)
Script sent to the pharmacy and pt aware. 

## 2017-07-02 NOTE — Telephone Encounter (Signed)
Rx sent. Patient advised. 

## 2017-07-06 ENCOUNTER — Encounter: Payer: Self-pay | Admitting: Internal Medicine

## 2017-08-02 DIAGNOSIS — M797 Fibromyalgia: Secondary | ICD-10-CM | POA: Diagnosis not present

## 2017-08-31 DIAGNOSIS — F410 Panic disorder [episodic paroxysmal anxiety] without agoraphobia: Secondary | ICD-10-CM | POA: Diagnosis not present

## 2017-08-31 DIAGNOSIS — F310 Bipolar disorder, current episode hypomanic: Secondary | ICD-10-CM | POA: Diagnosis not present

## 2017-11-05 DIAGNOSIS — B349 Viral infection, unspecified: Secondary | ICD-10-CM | POA: Diagnosis not present

## 2017-12-06 ENCOUNTER — Other Ambulatory Visit: Payer: Self-pay | Admitting: Gastroenterology

## 2017-12-06 NOTE — Telephone Encounter (Signed)
Jess- Do you want to refill this ondansetron prescription?

## 2017-12-07 NOTE — Telephone Encounter (Signed)
Ok to refill for now if she still wants/needs it.

## 2018-01-19 ENCOUNTER — Other Ambulatory Visit: Payer: Self-pay | Admitting: Internal Medicine

## 2018-03-02 DIAGNOSIS — F310 Bipolar disorder, current episode hypomanic: Secondary | ICD-10-CM | POA: Diagnosis not present

## 2018-03-02 DIAGNOSIS — F4310 Post-traumatic stress disorder, unspecified: Secondary | ICD-10-CM | POA: Diagnosis not present

## 2018-03-02 DIAGNOSIS — F410 Panic disorder [episodic paroxysmal anxiety] without agoraphobia: Secondary | ICD-10-CM | POA: Diagnosis not present

## 2018-04-15 DIAGNOSIS — S53491A Other sprain of right elbow, initial encounter: Secondary | ICD-10-CM | POA: Diagnosis not present

## 2018-04-15 DIAGNOSIS — S5011XA Contusion of right forearm, initial encounter: Secondary | ICD-10-CM | POA: Diagnosis not present

## 2018-04-15 DIAGNOSIS — L03211 Cellulitis of face: Secondary | ICD-10-CM | POA: Diagnosis not present

## 2018-05-11 DIAGNOSIS — N764 Abscess of vulva: Secondary | ICD-10-CM | POA: Diagnosis not present

## 2018-05-11 DIAGNOSIS — R05 Cough: Secondary | ICD-10-CM | POA: Diagnosis not present

## 2018-05-13 DIAGNOSIS — N764 Abscess of vulva: Secondary | ICD-10-CM | POA: Diagnosis not present

## 2018-06-03 DIAGNOSIS — B373 Candidiasis of vulva and vagina: Secondary | ICD-10-CM | POA: Diagnosis not present

## 2018-07-21 ENCOUNTER — Other Ambulatory Visit: Payer: Self-pay | Admitting: Internal Medicine

## 2018-08-29 IMAGING — NM NM HEPATO W/GB/PHARM/[PERSON_NAME]
1 series · 12 of 12 positions shown · non-contrast
Comparison: 06/03/2017

CLINICAL DATA: Right upper quadrant pain

EXAM:
NUCLEAR MEDICINE HEPATOBILIARY IMAGING WITH GALLBLADDER EF
TECHNIQUE: Sequential images of the abdomen were obtained [DATE] minutes
following intravenous administration of radiopharmaceutical. After
oral ingestion of Ensure, gallbladder ejection fraction was
determined. At 60 min, normal ejection fraction is greater than 33%.
RADIOPHARMACEUTICALS:  5.1 mCi Nc-DDm  Choletec IV

[Series 1: hepato · 4.46mm/px · 2 acquisitions, 12 frames shown]
[im 1/2]
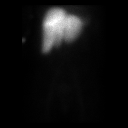
[im 1/2]
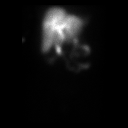
[im 1/2]
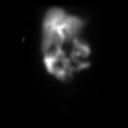
[im 1/2]
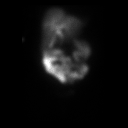
[im 1/2]
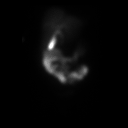
[im 1/2]
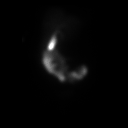
[im 2/2]
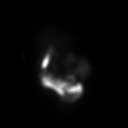
[im 2/2]
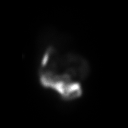
[im 2/2]
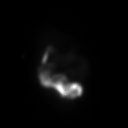
[im 2/2]
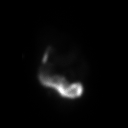
[im 2/2]
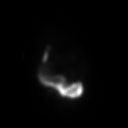
[im 2/2]
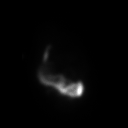

[12 of 12 positions shown; findings below may reference images not displayed]

FINDINGS: Prompt uptake and biliary excretion of activity by the liver is
seen. Gallbladder activity is visualized, consistent with patency of
cystic duct. Biliary activity passes into small bowel, consistent
with patent common bile duct.

Calculated gallbladder ejection fraction is 64%. (Normal gallbladder
ejection fraction with Ensure is greater than 33%.)
IMPRESSION: Normal hepatobiliary scan and gallbladder ejection fraction

## 2019-01-28 ENCOUNTER — Other Ambulatory Visit: Payer: Self-pay | Admitting: Internal Medicine

## 2019-07-18 ENCOUNTER — Other Ambulatory Visit: Payer: Self-pay | Admitting: Cardiology

## 2020-01-29 ENCOUNTER — Emergency Department (HOSPITAL_COMMUNITY)
Admission: EM | Admit: 2020-01-29 | Discharge: 2020-01-29 | Disposition: A | Discharge status: Home / Self Care | Payer: Federal, State, Local not specified - Other | Attending: Emergency Medicine | Admitting: Emergency Medicine

## 2020-01-29 ENCOUNTER — Encounter (HOSPITAL_COMMUNITY): Payer: Self-pay | Admitting: Emergency Medicine

## 2020-01-29 ENCOUNTER — Inpatient Hospital Stay (HOSPITAL_COMMUNITY)
Admit: 2020-01-29 | Discharge: 2020-02-01 | DRG: 885 | Disposition: A | Discharge status: Home / Self Care | Payer: Federal, State, Local not specified - Other | Source: Intra-hospital | Attending: Psychiatry | Admitting: Psychiatry

## 2020-01-29 ENCOUNTER — Other Ambulatory Visit: Payer: Self-pay

## 2020-01-29 DIAGNOSIS — Y903 Blood alcohol level of 60-79 mg/100 ml: Secondary | ICD-10-CM | POA: Diagnosis present

## 2020-01-29 DIAGNOSIS — Z915 Personal history of self-harm: Secondary | ICD-10-CM

## 2020-01-29 DIAGNOSIS — F4310 Post-traumatic stress disorder, unspecified: Secondary | ICD-10-CM | POA: Diagnosis present

## 2020-01-29 DIAGNOSIS — F332 Major depressive disorder, recurrent severe without psychotic features: Secondary | ICD-10-CM | POA: Diagnosis present

## 2020-01-29 DIAGNOSIS — F10139 Alcohol abuse with withdrawal, unspecified: Secondary | ICD-10-CM | POA: Diagnosis not present

## 2020-01-29 DIAGNOSIS — F1410 Cocaine abuse, uncomplicated: Secondary | ICD-10-CM

## 2020-01-29 DIAGNOSIS — F17210 Nicotine dependence, cigarettes, uncomplicated: Secondary | ICD-10-CM | POA: Diagnosis present

## 2020-01-29 DIAGNOSIS — T50902A Poisoning by unspecified drugs, medicaments and biological substances, intentional self-harm, initial encounter: Secondary | ICD-10-CM

## 2020-01-29 DIAGNOSIS — Z20822 Contact with and (suspected) exposure to covid-19: Secondary | ICD-10-CM | POA: Diagnosis present

## 2020-01-29 DIAGNOSIS — T424X4A Poisoning by benzodiazepines, undetermined, initial encounter: Secondary | ICD-10-CM | POA: Diagnosis not present

## 2020-01-29 DIAGNOSIS — F314 Bipolar disorder, current episode depressed, severe, without psychotic features: Secondary | ICD-10-CM | POA: Diagnosis present

## 2020-01-29 DIAGNOSIS — F419 Anxiety disorder, unspecified: Secondary | ICD-10-CM | POA: Diagnosis present

## 2020-01-29 DIAGNOSIS — F10129 Alcohol abuse with intoxication, unspecified: Secondary | ICD-10-CM | POA: Insufficient documentation

## 2020-01-29 DIAGNOSIS — F10920 Alcohol use, unspecified with intoxication, uncomplicated: Secondary | ICD-10-CM | POA: Diagnosis not present

## 2020-01-29 DIAGNOSIS — T424X2A Poisoning by benzodiazepines, intentional self-harm, initial encounter: Secondary | ICD-10-CM | POA: Insufficient documentation

## 2020-01-29 LAB — CBC WITH DIFFERENTIAL/PLATELET
Abs Immature Granulocytes: 0.02 10*3/uL (ref 0.00–0.07)
Basophils Absolute: 0 10*3/uL (ref 0.0–0.1)
Basophils Relative: 0 %
Eosinophils Absolute: 0.1 10*3/uL (ref 0.0–0.5)
Eosinophils Relative: 2 %
HCT: 38.9 % (ref 36.0–46.0)
Hemoglobin: 13 g/dL (ref 12.0–15.0)
Immature Granulocytes: 0 %
Lymphocytes Relative: 39 %
Lymphs Abs: 2.9 10*3/uL (ref 0.7–4.0)
MCH: 32.1 pg (ref 26.0–34.0)
MCHC: 33.4 g/dL (ref 30.0–36.0)
MCV: 96 fL (ref 80.0–100.0)
Monocytes Absolute: 0.4 10*3/uL (ref 0.1–1.0)
Monocytes Relative: 5 %
Neutro Abs: 4 10*3/uL (ref 1.7–7.7)
Neutrophils Relative %: 54 %
Platelets: 225 10*3/uL (ref 150–400)
RBC: 4.05 MIL/uL (ref 3.87–5.11)
RDW: 12.2 % (ref 11.5–15.5)
WBC: 7.4 10*3/uL (ref 4.0–10.5)
nRBC: 0 % (ref 0.0–0.2)

## 2020-01-29 LAB — RAPID URINE DRUG SCREEN, HOSP PERFORMED
Amphetamines: NOT DETECTED
Barbiturates: NOT DETECTED
Benzodiazepines: POSITIVE — AB
Cocaine: POSITIVE — AB
Opiates: NOT DETECTED
Tetrahydrocannabinol: NOT DETECTED

## 2020-01-29 LAB — COMPREHENSIVE METABOLIC PANEL
ALT: 18 U/L (ref 0–44)
AST: 14 U/L — ABNORMAL LOW (ref 15–41)
Albumin: 3.3 g/dL — ABNORMAL LOW (ref 3.5–5.0)
Alkaline Phosphatase: 58 U/L (ref 38–126)
Anion gap: 9 (ref 5–15)
BUN: 12 mg/dL (ref 6–20)
CO2: 21 mmol/L — ABNORMAL LOW (ref 22–32)
Calcium: 7.1 mg/dL — ABNORMAL LOW (ref 8.9–10.3)
Chloride: 110 mmol/L (ref 98–111)
Creatinine, Ser: 0.74 mg/dL (ref 0.44–1.00)
GFR calc Af Amer: >60 mL/min (ref >60)
GFR calc non Af Amer: >60 mL/min (ref >60)
Glucose, Bld: 81 mg/dL (ref 70–99)
Potassium: 3.4 mmol/L — ABNORMAL LOW (ref 3.5–5.1)
Sodium: 140 mmol/L (ref 135–145)
Total Bilirubin: 0.4 mg/dL (ref 0.3–1.2)
Total Protein: 6 g/dL — ABNORMAL LOW (ref 6.5–8.1)

## 2020-01-29 LAB — SARS CORONAVIRUS 2 BY RT PCR (HOSPITAL ORDER, PERFORMED IN ~~LOC~~ HOSPITAL LAB): SARS Coronavirus 2: NEGATIVE

## 2020-01-29 LAB — ACETAMINOPHEN LEVEL: Acetaminophen (Tylenol), Serum: <10 ug/mL — ABNORMAL LOW (ref 10–30)

## 2020-01-29 LAB — I-STAT BETA HCG BLOOD, ED (MC, WL, AP ONLY): I-stat hCG, quantitative: <5 m[IU]/mL (ref <5)

## 2020-01-29 LAB — ETHANOL: Alcohol, Ethyl (B): 67 mg/dL — ABNORMAL HIGH (ref <10)

## 2020-01-29 LAB — SALICYLATE LEVEL: Salicylate Lvl: <7 mg/dL — ABNORMAL LOW (ref 7.0–30.0)

## 2020-01-29 MED ORDER — GABAPENTIN 100 MG PO CAPS
200.0000 mg | ORAL_CAPSULE | Freq: Three times a day (TID) | ORAL | Status: DC
  Administered 2020-01-29 (×2): 200 mg via ORAL
  Filled 2020-01-29 (×2): qty 2

## 2020-01-29 MED ORDER — CHLORDIAZEPOXIDE HCL 25 MG PO CAPS: 25.0000 mg | ORAL_CAPSULE | Freq: Every day | ORAL | Status: DC

## 2020-01-29 MED ORDER — TRAZODONE HCL 100 MG PO TABS
100.0000 mg | ORAL_TABLET | Freq: Every evening | ORAL | Status: DC | PRN
  Administered 2020-01-29: 100 mg via ORAL
  Filled 2020-01-29: qty 1

## 2020-01-29 MED ORDER — THIAMINE HCL 100 MG PO TABS
100.0000 mg | ORAL_TABLET | Freq: Every day | ORAL | Status: DC
  Administered 2020-01-29: 100 mg via ORAL
  Filled 2020-01-29: qty 1

## 2020-01-29 MED ORDER — HYDROXYZINE HCL 25 MG PO TABS
25.0000 mg | ORAL_TABLET | Freq: Four times a day (QID) | ORAL | Status: DC | PRN
  Administered 2020-01-29 – 2020-02-01 (×6): 25 mg via ORAL
  Filled 2020-01-29 (×4): qty 1
  Filled 2020-01-29: qty 10
  Filled 2020-01-29 (×2): qty 1

## 2020-01-29 MED ORDER — THIAMINE HCL 100 MG/ML IJ SOLN: 100.0000 mg | Freq: Once | INTRAMUSCULAR | Status: DC

## 2020-01-29 MED ORDER — LOPERAMIDE HCL 2 MG PO CAPS: 2.0000 mg | ORAL_CAPSULE | ORAL | Status: DC | PRN

## 2020-01-29 MED ORDER — THIAMINE HCL 100 MG/ML IJ SOLN
Freq: Once | INTRAVENOUS | Status: AC
  Filled 2020-01-29: qty 1000

## 2020-01-29 MED ORDER — CHLORDIAZEPOXIDE HCL 25 MG PO CAPS: 25.0000 mg | ORAL_CAPSULE | Freq: Four times a day (QID) | ORAL | Status: DC | PRN

## 2020-01-29 MED ORDER — CHLORDIAZEPOXIDE HCL 25 MG PO CAPS
25.0000 mg | ORAL_CAPSULE | Freq: Four times a day (QID) | ORAL | Status: DC
  Administered 2020-01-29 (×2): 25 mg via ORAL
  Filled 2020-01-29 (×2): qty 1

## 2020-01-29 MED ORDER — CHLORDIAZEPOXIDE HCL 25 MG PO CAPS: 25.0000 mg | ORAL_CAPSULE | ORAL | Status: DC

## 2020-01-29 MED ORDER — LAMOTRIGINE 25 MG PO TABS
50.0000 mg | ORAL_TABLET | Freq: Every day | ORAL | Status: DC
  Administered 2020-01-29: 50 mg via ORAL
  Filled 2020-01-29: qty 2

## 2020-01-29 MED ORDER — CHLORDIAZEPOXIDE HCL 25 MG PO CAPS: 25.0000 mg | ORAL_CAPSULE | Freq: Three times a day (TID) | ORAL | Status: DC

## 2020-01-29 MED ORDER — HYDROXYZINE HCL 25 MG PO TABS
25.0000 mg | ORAL_TABLET | Freq: Four times a day (QID) | ORAL | Status: DC | PRN
  Administered 2020-01-29: 25 mg via ORAL
  Filled 2020-01-29: qty 1

## 2020-01-29 MED ORDER — ADULT MULTIVITAMIN W/MINERALS CH
1.0000 | ORAL_TABLET | Freq: Every day | ORAL | Status: DC
  Administered 2020-01-29: 1 via ORAL
  Filled 2020-01-29: qty 1

## 2020-01-29 MED ORDER — ONDANSETRON 4 MG PO TBDP: 4.0000 mg | ORAL_TABLET | Freq: Four times a day (QID) | ORAL | Status: DC | PRN

## 2020-01-29 NOTE — ED Notes (Signed)
Pt ambulated to bathroom with one staff assist. 

## 2020-01-29 NOTE — ED Notes (Signed)
Pt provided scrub pants.

## 2020-01-29 NOTE — ED Provider Notes (Signed)
7:25 AM Care assumed from Dr. Read Drivers.  At time of transfer care, patient is awaiting assessment by TTS that she is now under IVC for overdose.  Psychiatry reports that she will need inpatient management.  They requested Covid swab which will be ordered.  Patient awaiting psych placement.   Wyona Neils, Canary Brim, MD 01/29/20 1511

## 2020-01-29 NOTE — ED Notes (Signed)
TTS will assess pt once she is medically cleared and awake

## 2020-01-29 NOTE — BHH Counselor (Signed)
Clinician spoke to Gates, RN and expressed pt to be assessed once medically cleared. Per Lurena Joiner, RN pt is currently sleeping.   Redmond Pulling, MS, [facility], [facility] Triage Specialist [phone]

## 2020-01-29 NOTE — ED Notes (Signed)
Pt moved to hall bed while waiting for transfer [facility]; sitter at bedside.

## 2020-01-29 NOTE — Consult Note (Signed)
[facility] Face-to-Face Psychiatry Consult   Reason for Consult: Intentional overdose Referring Physician: Emergency room physician   patient Identification: [name] MRN:  [id number] Principal Diagnosis: <principal problem not specified> Diagnosis:  Active Problems:   * No active hospital problems. *   Total Time spent with patient: 30 minutes  Subjective:   [name] is a 39 y.o. female patient admitted with intentional overdose of Xanax.  HPI: Patient is seen and examined.  Patient is a 39 year old female with a possible past psychiatric history significant for depression, anxiety and possible bipolar disorder who presented to the [facility] emergency department on 01/29/2020 after an intentional overdose of Xanax with alcohol.  Apparently she had taken greater than 100 mg of Xanax.  She had also taken alcohol with this.  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  She stated she and her husband got into an argument.  She also stated that she is under stress caring for her 41 year old with autism in the home.  She is followed by Dr. Evelene Croon locally, and is prescribed Xanax 1 mg p.o. 4 times daily.  When the Xanax bottle was evaluated there were only 4 tablets present.  She has been given 120 tablets on 01/18/2020.  Patient is tearful, but denies current suicidal ideation.  We discussed the fact that the overdose was so significant especially in the presence of alcohol that she required psychiatric hospitalization.  Past Psychiatric History: Patient denied any previous psychiatric admissions.  She stated she had attempted to hurt her self and needed 2013 or 14.  Risk to Self:   Risk to Others:   Prior Inpatient Therapy:   Prior Outpatient Therapy:    Past Medical History:  Past Medical History:  Diagnosis Date  . Alcohol abuse   . History of cervical dysplasia    CIN I  in  2004  . Inflamed external  hemorrhoid   . Inflamed internal hemorrhoid   . Major depression   . PTSD (post-traumatic stress disorder)   . Rectal pain   . Scoliosis 11/12/2012    Past Surgical History:  Procedure Laterality Date  . APPENDECTOMY  1994  . CERVICAL BIOPSY  W/ LOOP ELECTRODE EXCISION  06-11-2003  . EVALUATION UNDER ANESTHESIA WITH HEMORRHOIDECTOMY N/A 01/22/2014   Procedure: EXAM UNDER ANESTHESIA WITH HEMORRHOIDECTOMY;  Surgeon: Romie Levee, MD;  Location: [facility];  Service: General;  Laterality: N/A;  . TUBAL LIGATION  05-08-2005   W/  FILSHIE CLIPS   Family History:  Family History  Problem Relation Age of Onset  . Cancer Father        prostate  . Cancer Sister        bladder  . Lung disease Mother   . Cancer Maternal Uncle        Lung  . Cancer Paternal Aunt        Breast  . Cancer Maternal Grandfather   . Cancer Paternal Grandfather   . Cancer Paternal Aunt        Breast   Family Psychiatric  History: Denied Social History:  Social History   Substance and Sexual Activity  Alcohol Use Yes  . Alcohol/week: 2.0 standard drinks  . Types: 2 Cans of beer per week   Comment: hx   alcohol abuse-- per pt has cut back     Social History   Substance and Sexual Activity  Drug Use Yes  .  Types: Marijuana   Comment: pt denies (there is documented hx cannibus use)    Social History   Socioeconomic History  . Marital status: Married    Spouse name: Not on file  . Number of children: Not on file  . Years of education: Not on file  . Highest education level: Not on file  Occupational History  . Not on file  Tobacco Use  . Smoking status: Current Every Day Smoker    Packs/day: 0.50    Years: 15.00    Pack years: 7.50    Types: Cigarettes  . Smokeless tobacco: Never Used  Substance and Sexual Activity  . Alcohol use: Yes    Alcohol/week: 2.0 standard drinks    Types: 2 Cans of beer per week    Comment: hx   alcohol abuse-- per pt has cut back  . Drug use:  Yes    Types: Marijuana    Comment: pt denies (there is documented hx cannibus use)  . Sexual activity: Yes    Birth control/protection: Surgical  Other Topics Concern  . Not on file  Social History Narrative   Pt also has hx of an aunt and uncle with lung cancer.          Social Determinants of Health   Financial Resource Strain:   . Difficulty of Paying Living Expenses:   Food Insecurity:   . Worried About Charity fundraiser in the Last Year:   . Arboriculturist in the Last Year:   Transportation Needs:   . Film/video editor (Medical):   Marland Kitchen Lack of Transportation (Non-Medical):   Physical Activity:   . Days of Exercise per Week:   . Minutes of Exercise per Session:   Stress:   . Feeling of Stress :   Social Connections:   . Frequency of Communication with Friends and Family:   . Frequency of Social Gatherings with Friends and Family:   . Attends Religious Services:   . Active Member of Clubs or Organizations:   . Attends Archivist Meetings:   Marland Kitchen Marital Status:    Additional Social History:    Allergies:  No Known Allergies  Labs:  Results for orders placed or performed during the hospital encounter of 01/29/20 (from the past 48 hour(s))  CBC with Differential/Platelet     Status: None   Collection Time: 01/29/20  2:35 AM  Result Value Ref Range   WBC 7.4 4.0 - 10.5 K/uL   RBC 4.05 3.87 - 5.11 MIL/uL   Hemoglobin 13.0 12.0 - 15.0 g/dL   HCT 38.9 36 - 46 %   MCV 96.0 80.0 - 100.0 fL   MCH 32.1 26.0 - 34.0 pg   MCHC 33.4 30.0 - 36.0 g/dL   RDW 12.2 11.5 - 15.5 %   Platelets 225 150 - 400 K/uL   nRBC 0.0 0.0 - 0.2 %   Neutrophils Relative % 54 %   Neutro Abs 4.0 1.7 - 7.7 K/uL   Lymphocytes Relative 39 %   Lymphs Abs 2.9 0.7 - 4.0 K/uL   Monocytes Relative 5 %   Monocytes Absolute 0.4 0 - 1 K/uL   Eosinophils Relative 2 %   Eosinophils Absolute 0.1 0 - 0 K/uL   Basophils Relative 0 %   Basophils Absolute 0.0 0 - 0 K/uL   Immature  Granulocytes 0 %   Abs Immature Granulocytes 0.02 0.00 - 0.07 K/uL    Comment: Performed at [facility], [address] [place], [place] [address]  Ethanol     Status: Abnormal   Collection Time: 01/29/20  2:35 AM  Result Value Ref Range   Alcohol, Ethyl (B) 67 (H) <10 mg/dL    Comment: (NOTE) Lowest detectable limit for serum alcohol is 10 mg/dL.  For medical purposes only. Performed at [facility], [address] [place], [place] [address]   Salicylate level     Status: Abnormal   Collection Time: 01/29/20  2:35 AM  Result Value Ref Range   Salicylate Lvl <7.0 (L) 7.0 - 30.0 mg/dL    Comment: Performed at [facility], [address], [place], [place] [address]  Acetaminophen level     Status: Abnormal   Collection Time: 01/29/20  2:35 AM  Result Value Ref Range   Acetaminophen (Tylenol), Serum <10 (L) 10 - 30 ug/mL    Comment: (NOTE) Therapeutic concentrations vary significantly. A range of 10-30 ug/mL  may be an effective concentration for many patients. However, some  are best treated at concentrations outside of this range. Acetaminophen concentrations >150 ug/mL at 4 hours after ingestion  and >50 ug/mL at 12 hours after ingestion are often associated with  toxic reactions.  Performed at [facility], [address], [place], [place] [address]   Rapid urine drug screen (hospital performed)     Status: Abnormal   Collection Time: 01/29/20  2:35 AM  Result Value Ref Range   Opiates NONE DETECTED NONE DETECTED   Cocaine POSITIVE (A) NONE DETECTED   Benzodiazepines POSITIVE (A) NONE DETECTED   Amphetamines NONE DETECTED NONE DETECTED   Tetrahydrocannabinol NONE DETECTED NONE DETECTED   Barbiturates NONE DETECTED NONE DETECTED    Comment: (NOTE) DRUG SCREEN FOR MEDICAL PURPOSES ONLY.  IF CONFIRMATION IS NEEDED FOR ANY PURPOSE, NOTIFY LAB WITHIN 5 DAYS.  LOWEST DETECTABLE LIMITS FOR  URINE DRUG SCREEN Drug Class                     Cutoff (ng/mL) Amphetamine and metabolites    1000 Barbiturate and metabolites    200 Benzodiazepine                 200 Tricyclics and metabolites     300 Opiates and metabolites        300 Cocaine and metabolites        300 THC                            50 Performed at [facility], [address] [place], [place] [address]   I-Stat Beta hCG blood, ED ([facility], [facility], [facility] only)     Status: None   Collection Time: 01/29/20  2:48 AM  Result Value Ref Range   I-stat hCG, quantitative <5.0 <5 mIU/mL   Comment 3            Comment:   GEST. AGE      CONC.  (mIU/mL)   <=1 WEEK        5 - 50     2 WEEKS       50 - 500     3 WEEKS       100 - 10,000     4 WEEKS     1,000 - 30,000        FEMALE AND NON-PREGNANT FEMALE:     LESS THAN 5 mIU/mL  Comprehensive metabolic panel     Status: Abnormal   Collection Time: 01/29/20  3:14 AM  Result Value Ref Range   Sodium 140 135 - 145 mmol/L   Potassium 3.4 (L) 3.5 - 5.1 mmol/L   Chloride 110 98 - 111 mmol/L   CO2 21 (L) 22 - 32 mmol/L   Glucose, Bld 81 70 - 99 mg/dL    Comment: Glucose reference range applies only to samples taken after fasting for at least 8 hours.   BUN 12 6 - 20 mg/dL   Creatinine, Ser 1.470.74 0.44 - 1.00 mg/dL   Calcium 7.1 (L) 8.9 - 10.3 mg/dL   Total Protein 6.0 (L) 6.5 - 8.1 g/dL   Albumin 3.3 (L) 3.5 - 5.0 g/dL   AST 14 (L) 15 - 41 U/L   ALT 18 0 - 44 U/L   Alkaline Phosphatase 58 38 - 126 U/L   Total Bilirubin 0.4 0.3 - 1.2 mg/dL   GFR calc non Af Amer >60 >60 mL/min   GFR calc Af Amer >60 >60 mL/min   Anion gap 9 5 - 15    Comment: Performed at [facility], [address] [place], [place] [id number]    Current Facility-Administered Medications  Medication Dose Route Frequency Provider Last Rate Last Admin  . 0.9 %  sodium chloride infusion  500 mL Intravenous Continuous Pyrtle, Carie CaddyJay M, MD       Current Outpatient Medications   Medication Sig Dispense Refill  . ALPRAZolam (XANAX) 1 MG tablet Take 1 mg by mouth 4 (four) times daily as needed.     . Alum & Mag Hydroxide-Simeth (GI COCKTAIL) SUSP suspension Take 30 mLs 2 (two) times daily as needed by mouth for indigestion. Shake well. (Patient not taking: Reported on 01/29/2020) 300 mL 0  . omeprazole (PRILOSEC) 40 MG capsule TAKE 1 CAPSULE BY MOUTH EVERY DAY (Patient not taking: Reported on 01/29/2020) 30 capsule 2  . ondansetron (ZOFRAN-ODT) 4 MG disintegrating tablet TAKE ONE TABLET BY MOUTH EVERY 8 HOURS AS NEEDED FOR NAUSEA OR VOMITING (Patient not taking: Reported on 01/29/2020) 30 tablet 2  . sucralfate (CARAFATE) 1 g tablet Take 1 tablet (1 g total) 4 (four) times daily by mouth. (Patient not taking: Reported on 01/29/2020) 120 tablet 1   Facility-Administered Medications Ordered in Other Encounters  Medication Dose Route Frequency Provider Last Rate Last Admin  . naproxen (NAPROSYN) tablet 375 mg  375 mg Oral TID WC Tamala JulianMashburn, Neil T, PA-C        Musculoskeletal: Strength & Muscle Tone: decreased Gait & Station: unsteady Patient leans: N/A  Psychiatric Specialty Exam: Physical Exam  Nursing note and vitals reviewed. Constitutional: She is oriented to person, place, and time.  HENT:  Head: Normocephalic and atraumatic.  Respiratory: Effort normal.  Neurological: She is alert and oriented to person, place, and time.    Review of Systems  Blood pressure 93/70, pulse 85, temperature 97.9 F (36.6 C), temperature source Oral, resp. rate 15, SpO2 95 %.There is no height or weight on file to calculate BMI.  General Appearance: Disheveled  Eye Contact:  Fair  Speech:  Normal Rate  Volume:  Decreased  Mood:  Dysphoric  Affect:  Congruent  Thought Process:  Goal Directed and Descriptions of Associations: Circumstantial  Orientation:  Full (Time, Place, and Person)  Thought Content:  Logical  Suicidal Thoughts:  No  Homicidal Thoughts:  No  Memory:   Immediate;   Fair Recent;   Fair  Remote;   Fair  Judgement:  Impaired  Insight:  Lacking  Psychomotor Activity:  Decreased  Concentration:  Concentration: Fair and Attention Span: Fair  Recall:  [employer] of Knowledge:  Fair  Language:  Fair  Akathisia:  Negative  Handed:  Right  AIMS (if indicated):     Assets:  Desire for Improvement Resilience  ADL's:  Intact  Cognition:  WNL  Sleep:        Treatment Plan Summary: Daily contact with patient to assess and evaluate symptoms and progress in treatment, Medication management and Plan : Patient is seen and examined.  Patient is a 39 year old female with the above-stated past psychiatric history who had taken an intentional overdose of Xanax with alcohol.  She will be stabilized in the emergency department.  Once medically clear and a negative Covid test available she will be transferred to the inpatient psychiatric facility.  In the meantime I will place her on a standing lorazepam taper given the overdose.  Additionally I will restart her Lamictal to make sure that there is not any increased risk of seizure.  We will continue her other medications once she is in the psychiatric hospital.  Admission laboratories revealed a mildly low potassium at 3.4, normal liver function enzymes.  Her CBC was normal.  Differential was normal.  Acetaminophen and salicylate were both negative.  Her blood alcohol on admission was 67.  Beta-hCG is negative.  Her drug screen was positive for benzodiazepines and cocaine.  Disposition: Recommend psychiatric Inpatient admission when medically cleared.  Antonieta Pert, MD 01/29/2020 11:24 AM

## 2020-01-29 NOTE — ED Triage Notes (Signed)
Pt arrived via EMS. Pt took "a handful" of alprazolam around 1:30am. Pt has been getting more and more confused. Pt sat down in the chair in her pt room and slid onto her bottom. Pt denies suicidal ideation. Pt's alprazolam prescription was filled on 01/18/20.

## 2020-01-29 NOTE — ED Provider Notes (Signed)
[facility]-EMERGENCY DEPT Provider Note: Lowella Dell, MD, FACEP  CSN: [id number] MRN: [id number] ARRIVAL: 01/29/20 at 0211 ROOM: WA14/WA14   CHIEF COMPLAINT  Drug Overdose   HISTORY OF PRESENT ILLNESS  01/29/20 2:32 AM [name] is a 39 y.o. female with a history of depression and alcohol abuse.  She receives 120, 1 mg Xanax tablets monthly from her psychiatrist.  She is here after taking an unknown ("a handful") number of Xanax tablets about 1:30 AM this morning.  She states she did this because she was "pissed off".  She also had admits that her husband encouraged this ("do which you need to do") although she denies that he actually dared her to overdose.  She states that this was not a suicide attempt.  She admits to drinking 6 beers earlier.  She denies physical complaint apart from soreness in her tailbone after abruptly sitting on the floor in her room on arrival.  The prescription bottle EMS brought shows that it was filled on 01/18/2020 with 120, 1 mg of Xanax tablets.  The bottle is now empty.  She states she is prescribed 4 tablets daily but does not usually take all 4 every day.  It is thus unclear how many tablets she took but could be as many as 75 or more.  Past Medical History:  Diagnosis Date  . Alcohol abuse   . History of cervical dysplasia    CIN I  in  2004  . Inflamed external hemorrhoid   . Inflamed internal hemorrhoid   . Major depression   . PTSD (post-traumatic stress disorder)   . Rectal pain   . Scoliosis 11/12/2012    Past Surgical History:  Procedure Laterality Date  . APPENDECTOMY  1994  . CERVICAL BIOPSY  W/ LOOP ELECTRODE EXCISION  06-11-2003  . EVALUATION UNDER ANESTHESIA WITH HEMORRHOIDECTOMY N/A 01/22/2014   Procedure: EXAM UNDER ANESTHESIA WITH HEMORRHOIDECTOMY;  Surgeon: Romie Levee, MD;  Location: [facility];  Service: General;  Laterality: N/A;  . TUBAL LIGATION  05-08-2005   W/  FILSHIE CLIPS    Family History  Problem  Relation Age of Onset  . Cancer Father        prostate  . Cancer Sister        bladder  . Lung disease Mother   . Cancer Maternal Uncle        Lung  . Cancer Paternal Aunt        Breast  . Cancer Maternal Grandfather   . Cancer Paternal Grandfather   . Cancer Paternal Aunt        Breast    Social History   Tobacco Use  . Smoking status: Current Every Day Smoker    Packs/day: 0.50    Years: 15.00    Pack years: 7.50    Types: Cigarettes  . Smokeless tobacco: Never Used  Substance Use Topics  . Alcohol use: Yes    Alcohol/week: 2.0 standard drinks    Types: 2 Cans of beer per week    Comment: hx   alcohol abuse-- per pt has cut back  . Drug use: Yes    Types: Marijuana    Comment: pt denies (there is documented hx cannibus use)    Prior to Admission medications   Medication Sig Start Date End Date Taking? Authorizing Provider  ALPRAZolam Prudy Feeler) 1 MG tablet Take 1 mg by mouth 4 (four) times daily as needed.  01/11/14   [provider]  Alum &  Mag Hydroxide-Simeth (GI COCKTAIL) SUSP suspension Take 30 mLs 2 (two) times daily as needed by mouth for indigestion. Shake well. 07/02/17   Irene Shipper, MD  gabapentin (NEURONTIN) 100 MG capsule Take 100 mg by mouth 2 (two) times daily. 04/20/17   [provider]  lamoTRIgine (LAMICTAL) 150 MG tablet Take 300 mg by mouth at bedtime. 05/12/17   [provider]  omeprazole (PRILOSEC) 40 MG capsule TAKE 1 CAPSULE BY MOUTH EVERY DAY 07/21/18   Pyrtle, Lajuan Lines, MD  ondansetron (ZOFRAN-ODT) 4 MG disintegrating tablet TAKE ONE TABLET BY MOUTH EVERY 8 HOURS AS NEEDED FOR NAUSEA OR VOMITING 12/07/17   Pyrtle, Lajuan Lines, MD  sucralfate (CARAFATE) 1 g tablet Take 1 tablet (1 g total) 4 (four) times daily by mouth. 06/30/17   Gatha Mayer, MD    Allergies Patient has no known allergies.   REVIEW OF SYSTEMS  Negative except as noted here or in the History of Present Illness.   PHYSICAL EXAMINATION  Initial Vital  Signs Blood pressure 121/73, pulse 98, temperature 97.9 F (36.6 C), temperature source Oral, resp. rate 19, SpO2 96 %.  Examination General: Well-developed, well-nourished female in no acute distress; appearance consistent with age of record HENT: normocephalic; atraumatic Eyes: pupils equal, round and reactive to light; extraocular muscles intact Neck: supple Heart: regular rate and rhythm Lungs: clear to auscultation bilaterally Abdomen: soft; nondistended; nontender; bowel sounds present Extremities: No deformity; full range of motion; pulses normal Neurologic: Awake, alert and oriented; dysarthria; ataxia;; motor function intact in all extremities and symmetric; no facial droop Skin: Warm and dry Psychiatric: Flat affect; appears intoxicated; denies SI   RESULTS  Summary of this visit's results, reviewed and interpreted by myself:   EKG Interpretation  Date/Time:    Ventricular Rate:    PR Interval:    QRS Duration:   QT Interval:    QTC Calculation:   R Axis:     Text Interpretation:        Laboratory Studies: Results for orders placed or performed during the hospital encounter of 01/29/20 (from the past 24 hour(s))  CBC with Differential/Platelet     Status: None   Collection Time: 01/29/20  2:35 AM  Result Value Ref Range   WBC 7.4 4.0 - 10.5 K/uL   RBC 4.05 3.87 - 5.11 MIL/uL   Hemoglobin 13.0 12.0 - 15.0 g/dL   HCT 38.9 36 - 46 %   MCV 96.0 80.0 - 100.0 fL   MCH 32.1 26.0 - 34.0 pg   MCHC 33.4 30.0 - 36.0 g/dL   RDW 12.2 11.5 - 15.5 %   Platelets 225 150 - 400 K/uL   nRBC 0.0 0.0 - 0.2 %   Neutrophils Relative % 54 %   Neutro Abs 4.0 1.7 - 7.7 K/uL   Lymphocytes Relative 39 %   Lymphs Abs 2.9 0.7 - 4.0 K/uL   Monocytes Relative 5 %   Monocytes Absolute 0.4 0 - 1 K/uL   Eosinophils Relative 2 %   Eosinophils Absolute 0.1 0 - 0 K/uL   Basophils Relative 0 %   Basophils Absolute 0.0 0 - 0 K/uL   Immature Granulocytes 0 %   Abs Immature Granulocytes  0.02 0.00 - 0.07 K/uL  Ethanol     Status: Abnormal   Collection Time: 01/29/20  2:35 AM  Result Value Ref Range   Alcohol, Ethyl (B) 67 (H) <47 mg/dL  Salicylate level     Status: Abnormal  Collection Time: 01/29/20  2:35 AM  Result Value Ref Range   Salicylate Lvl <7.0 (L) 7.0 - 30.0 mg/dL  Acetaminophen level     Status: Abnormal   Collection Time: 01/29/20  2:35 AM  Result Value Ref Range   Acetaminophen (Tylenol), Serum <10 (L) 10 - 30 ug/mL  Rapid urine drug screen (hospital performed)     Status: Abnormal   Collection Time: 01/29/20  2:35 AM  Result Value Ref Range   Opiates NONE DETECTED NONE DETECTED   Cocaine POSITIVE (A) NONE DETECTED   Benzodiazepines POSITIVE (A) NONE DETECTED   Amphetamines NONE DETECTED NONE DETECTED   Tetrahydrocannabinol NONE DETECTED NONE DETECTED   Barbiturates NONE DETECTED NONE DETECTED  I-Stat Beta hCG blood, ED ([facility], [facility], [facility] only)     Status: None   Collection Time: 01/29/20  2:48 AM  Result Value Ref Range   I-stat hCG, quantitative <5.0 <5 mIU/mL   Comment 3          Comprehensive metabolic panel     Status: Abnormal   Collection Time: 01/29/20  3:14 AM  Result Value Ref Range   Sodium 140 135 - 145 mmol/L   Potassium 3.4 (L) 3.5 - 5.1 mmol/L   Chloride 110 98 - 111 mmol/L   CO2 21 (L) 22 - 32 mmol/L   Glucose, Bld 81 70 - 99 mg/dL   BUN 12 6 - 20 mg/dL   Creatinine, Ser 8.29 0.44 - 1.00 mg/dL   Calcium 7.1 (L) 8.9 - 10.3 mg/dL   Total Protein 6.0 (L) 6.5 - 8.1 g/dL   Albumin 3.3 (L) 3.5 - 5.0 g/dL   AST 14 (L) 15 - 41 U/L   ALT 18 0 - 44 U/L   Alkaline Phosphatase 58 38 - 126 U/L   Total Bilirubin 0.4 0.3 - 1.2 mg/dL   GFR calc non Af Amer >60 >60 mL/min   GFR calc Af Amer >60 >60 mL/min   Anion gap 9 5 - 15   Imaging Studies: No results found.  ED COURSE and MDM  Nursing notes, initial and subsequent vitals signs, including pulse oximetry, reviewed and interpreted by myself.  Vitals:   01/29/20 0220 01/29/20 0222  BP:   121/73  Pulse:  98  Resp:  19  Temp:  97.9 F (36.6 C)  TempSrc:  Oral  SpO2: 96% 96%   Medications  sodium chloride 0.9 % 1,000 mL with thiamine 100 mg, folic acid 1 mg, multivitamins adult 10 mL infusion ( Intravenous New Bag/Given 01/29/20 0334)   3:51 AM Patient attempting to leave the ED.  Patient is too intoxicated (primarily from benzodiazepines, not high alcohol level) to make decisions at this time.  She is also too intoxicated to properly assess for suicidal ideation or intent.  She was involuntarily committed to prevent her from leaving pending TTS evaluation.  7:47 AM Signed out to Dr. Rush Landmark.  TTS evaluation pending.  PROCEDURES  Procedures   ED DIAGNOSES     ICD-10-CM   1. Intentional drug overdose, initial encounter (HCC)  T50.902A   2. Alcoholic intoxication without complication (HCC)  F10.920   3. Cocaine abuse (HCC)  F14.10        Tu Shimmel, Jonny Ruiz, MD 01/29/20 [phone]

## 2020-01-29 NOTE — ED Notes (Signed)
Report given to Elnita Maxwell, RN. Reports she will hand off report to Marcelo Baldy, Charity fundraiser.

## 2020-01-29 NOTE — ED Notes (Signed)
[facility] called for transport to [facility] 

## 2020-01-29 NOTE — ED Notes (Signed)
Memorie Yokoyama- [phone], husband.

## 2020-01-29 NOTE — BH Assessment (Signed)
[facility] Assessment Progress Note  Per Landry Mellow, MD, this pt requires psychiatric hospitalization.  Percell Boston, RN has tentatively assigned pt to [facility] Rm 307-1 pending negative Covid-19 test results; [facility] is unlikely to be ready to receive pt until the next shift..  Pt presents under IVC initiated by EDP Meriel Flavors, MD, and IVC documents have been faxed to [facility].  Pt's nurse has been notified, and agrees to call report to [phone].  Pt is to be transported via [employer].   Doylene Canning, [place] [facility] Coordinator [phone]

## 2020-01-29 NOTE — ED Notes (Signed)
Attempted to call report to [facility], but no answer.  

## 2020-01-29 NOTE — BH Assessment (Signed)
Assessment Note  [name] is an 39 y.o. female that presents this date after a intentional ingestion of Xanax. Patient is vague in reference to whether or not this was a attempt to take her life. Patient is currently denying any S/I, H/I or AVH. Patient reports two prior attempts at self harm. Patient was last seen in 2014 when she presented with similar symptoms. Patient overdosed on Xanax at that time also. Patient states she was diagnosed with depression "years ago" and reports her medications are currently prescribed by Evelene Croon MD. Patient reports she is currently compliant with that regimen. Patient reports ongoing alcohol use to include drinking "three to four beers" two to three a week with last use prior to arrival when patient reported she "had a couple beers." Patient's BAL was 67 on arrival. Patient also reports sporadic cannabis use stating she "smokes a bowl once in a while" with last use unknown. Patient's UDS was positive for cocaine this date although patient denies that use.   Per notes on arrival [facility] MD writes.  [name] is a 39 y.o. female with a history of depression and alcohol abuse. She receives 120, 1 mg Xanax tablets monthly from her psychiatrist. She is here after taking an unknown ("a handful") number of Xanax tablets about 1:30 AM this morning. She states she did this because she was "pissed off". She also had admits that her husband encouraged this ("do which you need to do") although she denies that he actually dared her to overdose. She states that this was not a suicide attempt. She admits to drinking 6 beers earlier. She denies physical complaint apart from soreness in her tailbone after abruptly sitting on the floor in her room on arrival.  The prescription bottle EMS brought shows that it was filled on 01/18/2020 with 120, 1 mg of Xanax tablets. The bottle is now empty.  She states she is prescribed 4 tablets daily but does not usually take all 4 every day.  It  is thus unclear how many tablets she took but could be as many as 75 or more.  Patient is observed to be drowsy at the time of assessment. Patient speaks in a low soft voice that is difficult to understand. Patient is tearful although thoughts are organized and memory intact. Patient does not appear to be responding to internal stimuli.   Case was staffed with Jola Babinski MD who also evlauted patient and recommended a inpatient admission.   Diagnosis: F33.2 MDD recurrent without psychotic features, severe, Alcohol abuse, Cannabis use    Past Medical History:  Past Medical History:  Diagnosis Date  . Alcohol abuse   . History of cervical dysplasia    CIN I  in  2004  . Inflamed external hemorrhoid   . Inflamed internal hemorrhoid   . Major depression   . PTSD (post-traumatic stress disorder)   . Rectal pain   . Scoliosis 11/12/2012    Past Surgical History:  Procedure Laterality Date  . APPENDECTOMY  1994  . CERVICAL BIOPSY  W/ LOOP ELECTRODE EXCISION  06-11-2003  . EVALUATION UNDER ANESTHESIA WITH HEMORRHOIDECTOMY N/A 01/22/2014   Procedure: EXAM UNDER ANESTHESIA WITH HEMORRHOIDECTOMY;  Surgeon: Romie Levee, MD;  Location: [facility];  Service: General;  Laterality: N/A;  . TUBAL LIGATION  05-08-2005   W/  FILSHIE CLIPS    Family History:  Family History  Problem Relation Age of Onset  . Cancer Father  prostate  . Cancer Sister        bladder  . Lung disease Mother   . Cancer Maternal Uncle        Lung  . Cancer Paternal Aunt        Breast  . Cancer Maternal Grandfather   . Cancer Paternal Grandfather   . Cancer Paternal Aunt        Breast    Social History:  reports that she has been smoking cigarettes. She has a 7.50 pack-year smoking history. She has never used smokeless tobacco. She reports current alcohol use of about 2.0 standard drinks of alcohol per week. She reports current drug use. Drug: Marijuana.  Additional Social History:  Alcohol /  Drug Use Pain Medications: See MAR Prescriptions: See MAR Over the Counter: See MAR History of alcohol / drug use?: Yes Longest period of sobriety (when/how long): Unknown Negative Consequences of Use:  (Denies) Withdrawal Symptoms:  (Denies) Substance #1 Name of Substance 1: Alcohol 1 - Age of First Use: 14 1 - Amount (size/oz): Varies 1 - Frequency: Varies 1 - Duration: Ongoing 1 - Last Use / Amount: 01/28/20 "a few beers" Substance #2 Name of Substance 2: Cannabis 2 - Age of First Use: 19 2 - Amount (size/oz): Varies 2 - Frequency: Varies 2 - Duration: Ongoing 2 - Last Use / Amount: 01/28/20 unknown amount  CIWA: CIWA-Ar BP: 93/70 Pulse Rate: 85 COWS:    Allergies: No Known Allergies  Home Medications: (Not in a hospital admission)   OB/GYN Status:  No LMP recorded.  General Assessment Data Assessment unable to be completed: Yes Reason for Not Completing Assessment: Clinician spoke to Wells Guiles, RN and expressed pt to be assessed once medically cleared. Per Wells Guiles, RN pt is currently sleeping. Location of Assessment: [facility] ED TTS Assessment: In system Is this a Tele or Face-to-Face Assessment?: Face-to-Face Is this an Initial Assessment or a Re-assessment for this encounter?: Initial Assessment Patient Accompanied by:: N/A Language Other than English: No Living Arrangements: Other (Comment) (With partner/Husband) What gender do you identify as?: Female Date Telepsych consult ordered in [facility]: 01/29/20 Marital status: Married [place] name: Pecora Pregnancy Status: No Living Arrangements: Spouse/significant other Can pt return to current living arrangement?: Yes Admission Status: Involuntary Petitioner: ED Attending Is patient capable of signing voluntary admission?: Yes Referral Source: Self/Family/Friend Insurance type: SP     Crisis Care Plan Living Arrangements: Spouse/significant other Legal Guardian:  (Self) Name of Psychiatrist: [facility] Name of Therapist:  None  Education Status Is patient currently in school?: No Is the patient employed, unemployed or receiving disability?: Unemployed  Risk to self with the past 6 months Suicidal Ideation: Yes-Currently Present Has patient been a risk to self within the past 6 months prior to admission? : No Suicidal Intent: Yes-Currently Present Has patient had any suicidal intent within the past 6 months prior to admission? : No Is patient at risk for suicide?: Yes Suicidal Plan?: Yes-Currently Present Has patient had any suicidal plan within the past 6 months prior to admission? : No Specify Current Suicidal Plan: Pt took a large amount of medication earlier this date Access to Means: Yes Specify Access to Suicidal Means: Pt had medications  What has been your use of drugs/alcohol within the last 12 months?: Current use Previous Attempts/Gestures: No How many times?: 2 Other Self Harm Risks:  (Excessive SA use) Triggers for Past Attempts: Unknown Intentional Self Injurious Behavior: None Family Suicide History: No Recent stressful life event(s): Other (Comment) (Conflict  with husband) Persecutory voices/beliefs?: No Depression: Yes Depression Symptoms: Feeling worthless/self pity Substance abuse history and/or treatment for substance abuse?: No Suicide prevention information given to non-admitted patients: Not applicable  Risk to Others within the past 6 months Homicidal Ideation: No Does patient have any lifetime risk of violence toward others beyond the six months prior to admission? : No Thoughts of Harm to Others: No Current Homicidal Intent: No Current Homicidal Plan: No Access to Homicidal Means: No Identified Victim: NA History of harm to others?: No Assessment of Violence: None Noted Violent Behavior Description: NA Does patient have access to weapons?: No Criminal Charges Pending?: No Does patient have a court date: No Is patient on probation?: No  Psychosis Hallucinations:  None noted Delusions: None noted  Mental Status Report Appearance/Hygiene: In scrubs Eye Contact: Fair Motor Activity: Freedom of movement Speech: Soft, Slow Level of Consciousness: Drowsy Mood: Depressed Affect: Appropriate to circumstance Anxiety Level: Minimal Thought Processes: Coherent, Relevant Judgement: Impaired Orientation: Person, Place, Time Obsessive Compulsive Thoughts/Behaviors: None  Cognitive Functioning Concentration: Decreased Memory: Recent Intact, Remote Intact Is patient IDD: No Insight: Fair Impulse Control: Poor Appetite: Fair Have you had any weight changes? : No Change Sleep: No Change Total Hours of Sleep: 7 Vegetative Symptoms: None  ADLScreening [facility] Assessment Services) Patient's cognitive ability adequate to safely complete daily activities?: Yes Patient able to express need for assistance with ADLs?: Yes Independently performs ADLs?: Yes (appropriate for developmental age)  Prior Inpatient Therapy Prior Inpatient Therapy: Yes Prior Therapy Dates: 2014 Prior Therapy Facilty/Provider(s): [name] Reason for Treatment: MH issues  Prior Outpatient Therapy Prior Outpatient Therapy: Yes Prior Therapy Dates: Ongoing Prior Therapy Facilty/Provider(s): E[facility] Cro[facility] Reason for Treatment: med mang Does patient have an ACCT team?: No Does patient have Intensive In-House Services?  : No Does patient have [facility] services? : No Does patient have P4CC services?: No  ADL Screening (condition at time of admission) Patient's cognitive ability adequate to safely complete daily activities?: Yes Is the patient deaf or have difficulty hearing?: No Does the patient have difficulty seeing, even when wearing glasses/contacts?: No Does the patient have difficulty concentrating, remembering, or making decisions?: No Patient able to express need for assistance with ADLs?: Yes Does the patient have difficulty dressing or bathing?: No Independently performs ADLs?: Yes  (appropriate for developmental age) Does the patient have difficulty walking or climbing stairs?: No Weakness of Legs: None Weakness of Arms/Hands: None  Home Assistive Devices/Equipment Home Assistive Devices/Equipment: None  Therapy Consults (therapy consults require a physician order) PT Evaluation Needed: No OT Evalulation Needed: No SLP Evaluation Needed: No Abuse/Neglect Assessment (Assessment to be complete while patient is alone) Abuse/Neglect Assessment Can Be Completed: Yes Physical Abuse: Denies Verbal Abuse: Denies Sexual Abuse: Denies Exploitation of patient/patient's resources: Denies Self-Neglect: Denies Values / Beliefs Cultural Requests During Hospitalization: None Spiritual Requests During Hospitalization: None Consults Spiritual Care Consult Needed: No Transition of Care Team Consult Needed: No Advance Directives (For Healthcare) Does Patient Have a Medical Advance Directive?: No Would patient like information on creating a medical advance directive?: No - Patient declined          Disposition: Case was staffed with Jola Babinski MD who also evlauted patient and recommended a inpatient admission.   Disposition Initial Assessment Completed for this Encounter: Yes Disposition of Patient: Admit Type of inpatient treatment program: Adult  On Site Evaluation by:   Reviewed with Physician:    Alfredia Ferguson 01/29/2020 12:31 PM

## 2020-01-30 ENCOUNTER — Encounter (HOSPITAL_COMMUNITY): Payer: Self-pay | Admitting: Psychiatry

## 2020-01-30 DIAGNOSIS — T424X4A Poisoning by benzodiazepines, undetermined, initial encounter: Secondary | ICD-10-CM

## 2020-01-30 DIAGNOSIS — F332 Major depressive disorder, recurrent severe without psychotic features: Secondary | ICD-10-CM | POA: Diagnosis present

## 2020-01-30 LAB — LIPID PANEL
Cholesterol: 198 mg/dL (ref 0–200)
HDL: 37 mg/dL — ABNORMAL LOW (ref >40)
LDL Cholesterol: 82 mg/dL (ref 0–99)
Total CHOL/HDL Ratio: 5.4 RATIO
Triglycerides: 394 mg/dL — ABNORMAL HIGH (ref <150)
VLDL: 79 mg/dL — ABNORMAL HIGH (ref 0–40)

## 2020-01-30 LAB — TSH: TSH: 1.267 u[IU]/mL (ref 0.350–4.500)

## 2020-01-30 MED ORDER — ONDANSETRON 4 MG PO TBDP: 4.0000 mg | ORAL_TABLET | Freq: Four times a day (QID) | ORAL | Status: DC | PRN

## 2020-01-30 MED ORDER — GABAPENTIN 100 MG PO CAPS
200.0000 mg | ORAL_CAPSULE | Freq: Three times a day (TID) | ORAL | Status: DC
  Administered 2020-01-30 – 2020-02-01 (×7): 200 mg via ORAL
  Filled 2020-01-30 (×8): qty 2
  Filled 2020-01-30 (×2): qty 42
  Filled 2020-01-30 (×3): qty 2
  Filled 2020-01-30: qty 42

## 2020-01-30 MED ORDER — CHLORDIAZEPOXIDE HCL 25 MG PO CAPS
25.0000 mg | ORAL_CAPSULE | Freq: Three times a day (TID) | ORAL | Status: DC
  Administered 2020-01-31 – 2020-02-01 (×2): 25 mg via ORAL
  Filled 2020-01-30 (×2): qty 1

## 2020-01-30 MED ORDER — CHLORDIAZEPOXIDE HCL 25 MG PO CAPS: 25.0000 mg | ORAL_CAPSULE | Freq: Every day | ORAL | Status: DC

## 2020-01-30 MED ORDER — ALUM & MAG HYDROXIDE-SIMETH 200-200-20 MG/5ML PO SUSP: 30.0000 mL | ORAL | Status: DC | PRN

## 2020-01-30 MED ORDER — LAMOTRIGINE 25 MG PO TABS
50.0000 mg | ORAL_TABLET | Freq: Every day | ORAL | Status: DC
  Administered 2020-01-30 – 2020-02-01 (×3): 50 mg via ORAL
  Filled 2020-01-30 (×4): qty 2
  Filled 2020-01-30: qty 14

## 2020-01-30 MED ORDER — ACETAMINOPHEN 325 MG PO TABS
650.0000 mg | ORAL_TABLET | Freq: Four times a day (QID) | ORAL | Status: DC | PRN
  Administered 2020-01-31 – 2020-02-01 (×2): 650 mg via ORAL
  Filled 2020-01-30 (×2): qty 2

## 2020-01-30 MED ORDER — LOPERAMIDE HCL 2 MG PO CAPS: 2.0000 mg | ORAL_CAPSULE | ORAL | Status: DC | PRN

## 2020-01-30 MED ORDER — CHLORDIAZEPOXIDE HCL 25 MG PO CAPS: 25.0000 mg | ORAL_CAPSULE | ORAL | Status: DC

## 2020-01-30 MED ORDER — CHLORDIAZEPOXIDE HCL 25 MG PO CAPS
25.0000 mg | ORAL_CAPSULE | Freq: Four times a day (QID) | ORAL | Status: DC | PRN
  Administered 2020-01-31: 25 mg via ORAL

## 2020-01-30 MED ORDER — CHLORDIAZEPOXIDE HCL 25 MG PO CAPS
25.0000 mg | ORAL_CAPSULE | Freq: Four times a day (QID) | ORAL | Status: AC
  Administered 2020-01-30 – 2020-01-31 (×5): 25 mg via ORAL
  Filled 2020-01-30 (×6): qty 1

## 2020-01-30 MED ORDER — ADULT MULTIVITAMIN W/MINERALS CH
1.0000 | ORAL_TABLET | Freq: Every day | ORAL | Status: DC
  Administered 2020-01-30 – 2020-02-01 (×3): 1 via ORAL
  Filled 2020-01-30 (×5): qty 1

## 2020-01-30 MED ORDER — MAGNESIUM HYDROXIDE 400 MG/5ML PO SUSP
30.0000 mL | Freq: Every day | ORAL | Status: DC | PRN
  Administered 2020-01-31: 30 mL via ORAL

## 2020-01-30 NOTE — Progress Notes (Signed)
Patient shared with the group that she misses her family. Her goal for tomorrow is to get discharged.

## 2020-01-30 NOTE — Progress Notes (Signed)
Recreation Therapy Notes  Animal-Assisted Activity (AAA) Program Checklist/Progress Notes Patient Eligibility Criteria Checklist & Daily Group note for Rec Tx Intervention  Date: 6.15.21 Time: 1430 Location: [address]   AAA/T Program Assumption of Risk Form signed by Engineer, production or Parent Legal Guardian  YES  Patient is free of allergies or sever asthma  YES  Patient reports no fear of animals  YES  Patient reports no history of cruelty to animals YES  Patient understands his/her participation is voluntary YES  Patient washes hands before animal contact  YES  Patient washes hands after animal contact  YES  Education: Charity fundraiser, Appropriate Animal Interaction   Education Outcome: Acknowledges understanding/In group clarification offered/Needs additional education.   Clinical Observations/Feedback: Pt did not attend activity.    Caroll Rancher, LRT/CTRS         Caroll Rancher A 01/30/2020 3:50 PM

## 2020-01-30 NOTE — [facility] Suicide Risk Assessment (Signed)
[facility] Admission Suicide Risk Assessment   Nursing information obtained from:  Patient Demographic factors:  Caucasian Current Mental Status:  NA Loss Factors:  NA Historical Factors:  Prior suicide attempts Risk Reduction Factors:  Living with another person, especially a relative, Responsible for children under 41 years of age  Total Time spent with patient: 45 minutes Principal Problem:  S/P BZD Overdose  Diagnosis:  Active Problems:   Severe recurrent major depression without psychotic features (HCC)  Subjective Data:   Continued Clinical Symptoms:  Alcohol Use Disorder Identification Test Final Score (AUDIT): 8 The "Alcohol Use Disorders Identification Test", Guidelines for Use in Primary Care, Second Edition.  World Science writer [facility]). Score between 0-7:  no or low risk or alcohol related problems. Score between 8-15:  moderate risk of alcohol related problems. Score between 16-19:  high risk of alcohol related problems. Score 20 or above:  warrants further diagnostic evaluation for alcohol dependence and treatment.   CLINICAL FACTORS:  39 y old female, presented on 6/14 following BZD overdose. She reports she is prescribed Xanax at 1 mgr QID, states she regularly takes 2-3 mgrs per day. She states that on day of admission she was frustrated that her husband took some of her Alprazolam and that she " took a handful" but at this time denies this was with self injurious or suicidal intent. She reports she had not been feeling depressed or experiencing suicidal ideations prior to event .She has been drinking 2-3 x per week up to 5 beers per episode. Admission BAL 67, UDS positive for BZD and for Cocaine.    Psychiatric Specialty Exam: Physical Exam  Review of Systems  Blood pressure 124/65, pulse 95, temperature 98.4 F (36.9 C), temperature source Oral, resp. rate 20, height 5\' 6"  (1.676 m), weight 78 kg, last menstrual period 01/21/2020, SpO2 99 %.Body mass index is 27.76  kg/m.  See admit note MSE                                                        COGNITIVE FEATURES THAT CONTRIBUTE TO RISK:  Closed-mindedness, Loss of executive function and Polarized thinking    SUICIDE RISK:   Moderate:  Frequent suicidal ideation with limited intensity, and duration, some specificity in terms of plans, no associated intent, good self-control, limited dysphoria/symptomatology, some risk factors present, and identifiable protective factors, including available and accessible social support.  PLAN OF CARE: Patient will be admitted to inpatient psychiatric unit for stabilization and safety. Will provide and encourage milieu participation. Provide medication management and maked adjustments as needed. Will also provide medication management to address WDL symptoms .  Will follow daily.    I certify that inpatient services furnished can reasonably be expected to improve the patient's condition.   03/22/2020, MD 01/30/2020, 1:24 PM

## 2020-01-30 NOTE — Progress Notes (Signed)
D:  Patient denied SI and HI, contracts for safety.  Denied A/V hallucinations.  Patient has been in bed most of the day, has not attended any groups. A:  Medications administered per MD orders.  Emotional support and encouragement given patient. R:  Safety maintained with 15 minute checks.

## 2020-01-30 NOTE — Tx Team (Signed)
Initial Treatment Plan 01/30/2020 12:36 AM DONALEE GAUMOND AWN:[id number]    PATIENT STRESSORS: Marital or family conflict Medication change or noncompliance Substance abuse   PATIENT STRENGTHS: Financial means Physical Health Work skills   PATIENT IDENTIFIED PROBLEMS: Substance abuse  Anxiety  "Be home with my son"                 DISCHARGE CRITERIA:  Improved stabilization in mood, thinking, and/or behavior Motivation to continue treatment in a less acute level of care Verbal commitment to aftercare and medication compliance  PRELIMINARY DISCHARGE PLAN: Attend aftercare/continuing care group Attend PHP/IOP Outpatient therapy Return to previous living arrangement Return to previous work or school arrangements  PATIENT/FAMILY INVOLVEMENT: This treatment plan has been presented to and reviewed with the patient, KENNYA SCHWENN, and/or family member.  The patient and family have been given the opportunity to ask questions and make suggestions.  Bethann Punches, RN 01/30/2020, 12:36 AM

## 2020-01-30 NOTE — Plan of Care (Signed)
Nurse discussed anxiety, depression and coping skills with patient.  

## 2020-01-30 NOTE — H&P (Addendum)
Psychiatric Admission Assessment Adult  Patient Identification: Haley NidaJessica D Reilly MRN:  [id number] Date of Evaluation:  01/30/2020 Chief Complaint:  " I am upset I am in the hospital, I don't think I needed to be admitted" Principal Diagnosis:S/P  BZD Overdose  Diagnosis: S/P BZD Overdose  History of Present Illness: Patient is a 39 year old female, presented to hospital on 6/14 following alprazolam overdose . She reports she is prescribed Xanax 1 mgr QID. She states she was irritated at her husband because he took some of this medication and she states she took a handful " I put them in my mouth but I think I spit them out". (As per chart notes, Xanax prescription bottle that EMS brought in indicated it was filled 01/18/2020 # 120 tablets and was empty).  She reiterates she did not have any suicidal ideations and that this was not suicidal . States " it was kind of if you take some then I'll  take some". She also reports she was drinking , and reports she drinks 2-3 x per week, up to 3-4 beers per episode. Admission BAL 67, admission UDS (+) for BZDs and Cocaine. Currently she denies significant depression leading up to above . She minimizes neuro-vegetative symptoms and describes her mood has been "OK". She denies anhedonia, and states her energy level, her appetite, and her sleep had been within normal, although does state she has been unable to sleep well over the last two days. Denies psychotic symptoms Associated Signs/Symptoms: Depression Symptoms:  Does not currently endorse  (Hypo) Manic Symptoms:  Does not endorse  Anxiety Symptoms:  Denies panic attacks or increased anxiety Psychotic Symptoms:  No hallucinations, no delusions PTSD Symptoms: Reports history of PTSD related to death of sister -2014. States symptoms have tended to improve overtime. Total Time spent with patient: 45 minutes  Past Psychiatric History:  Prior psychiatric admissions x 2 in 2014, for depression , SI following  death of sister . Reports history of PTSD related to finding her deceased sister in 2014 and attempting to resuscitate her. States PTSD symptoms have improved gradually overtime . Reports she has been diagnosed with Bipolar Disorder in the past. Currently does not endorse clear history of manic episodes . States she has been on several different psychiatric medications over the years and that Lamotrigine /Xanax have worked best for her.   Is the patient at risk to self? Yes.    Has the patient been a risk to self in the past 6 months? No.  Has the patient been a risk to self within the distant past? Yes.    Is the patient a risk to others? No.  Has the patient been a risk to others in the past 6 months? No.  Has the patient been a risk to others within the distant past? No.   Prior Inpatient Therapy:  as above  Prior Outpatient Therapy:  Outpatient psychiatrist is Dr. Evelene CroonKaur   Alcohol Screening: 1. How often do you have a drink containing alcohol?: 2 to 3 times a week 2. How many drinks containing alcohol do you have on a typical day when you are drinking?: 5 or 6 3. How often do you have six or more drinks on one occasion?: Monthly AUDIT-C Score: 7 4. How often during the last year have you found that you were not able to stop drinking once you had started?: Less than monthly 5. How often during the last year have you failed to do what was normally  expected from you because of drinking?: Never 6. How often during the last year have you needed a first drink in the morning to get yourself going after a heavy drinking session?: Never 7. How often during the last year have you had a feeling of guilt of remorse after drinking?: Never 8. How often during the last year have you been unable to remember what happened the night before because you had been drinking?: Never 9. Have you or someone else been injured as a result of your drinking?: No 10. Has a relative or friend or a doctor or another health  worker been concerned about your drinking or suggested you cut down?: No Alcohol Use Disorder Identification Test Final Score (AUDIT): 8 Alcohol Brief Interventions/Follow-up: Brief Advice Substance Abuse History in the last 12 months:  Reports she drinks 2-3 x per week up to 3-4 beers per episode . She reports long term management with Xanax, prescribed at 1 mgr QID.  She denies cocaine abuse, states she had used recently but " it had been a long time since I had used it " Consequences of Substance Abuse: She denies prior BZD overdoses, denies history of seizures , denies blackouts  Previous Psychotropic Medications: reports she has been on Xanax for years , prescribed at 1 mgr QID- states she normally takes 2-3 per day. She was also taking Lamictal 200 mgrs QDAY , Neurontin 300 mgrs TID.  She reports she has been on several other psychiatric medications over the years , remembers Celexa, Wellbutrin, Zoloft. States " they did not work for me" Psychological Evaluations:  No  Past Medical History:  Denies history of medical illnesses. Reports recent antibiotic course for bronchitis  Past Medical History:  Diagnosis Date  . Alcohol abuse   . History of cervical dysplasia    CIN I  in  2004  . Inflamed external hemorrhoid   . Inflamed internal hemorrhoid   . Major depression   . PTSD (post-traumatic stress disorder)   . Rectal pain   . Scoliosis 11/12/2012    Past Surgical History:  Procedure Laterality Date  . APPENDECTOMY  1994  . CERVICAL BIOPSY  W/ LOOP ELECTRODE EXCISION  06-11-2003  . EVALUATION UNDER ANESTHESIA WITH HEMORRHOIDECTOMY N/A 01/22/2014   Procedure: EXAM UNDER ANESTHESIA WITH HEMORRHOIDECTOMY;  Surgeon: Leighton Ruff, MD;  Location: [facility];  Service: General;  Laterality: N/A;  . TUBAL LIGATION  05-08-2005   W/  FILSHIE CLIPS   Family History: mother deceased from COPD (?) complications, father died from prostate cancer . Has one living brother, two  sisters deceased  Family History  Problem Relation Age of Onset  . Cancer Father        prostate  . Cancer Sister        bladder  . Lung disease Mother   . Cancer Maternal Uncle        Lung  . Cancer Paternal Aunt        Breast  . Cancer Maternal Grandfather   . Cancer Paternal Grandfather   . Cancer Paternal Aunt        Breast   Family Psychiatric  History: reports history of depression ( sisters ) . No known history of suicides in family.  Tobacco Screening: smokes 1/2 PPD  Social History: 63, married, has two children , ages 87,18, self employed . Social History   Substance and Sexual Activity  Alcohol Use Yes  . Alcohol/week: 2.0 standard drinks  . Types: 2 Cans  of beer per week   Comment: hx   alcohol abuse-- per pt has cut back     Social History   Substance and Sexual Activity  Drug Use Yes  . Types: Marijuana   Comment: pt denies (there is documented hx cannibus use)    Additional Social History:      Pain Medications: See MAR Prescriptions: See MAR Over the Counter: See MAR History of alcohol / drug use?: Yes Negative Consequences of Use: Personal relationships Withdrawal Symptoms: Irritability, Agitation  Allergies:  No Known Allergies Lab Results:  Results for orders placed or performed during the hospital encounter of 01/29/20 (from the past 48 hour(s))  CBC with Differential/Platelet     Status: None   Collection Time: 01/29/20  2:35 AM  Result Value Ref Range   WBC 7.4 4.0 - 10.5 K/uL   RBC 4.05 3.87 - 5.11 MIL/uL   Hemoglobin 13.0 12.0 - 15.0 g/dL   HCT 82.4 36 - 46 %   MCV 96.0 80.0 - 100.0 fL   MCH 32.1 26.0 - 34.0 pg   MCHC 33.4 30.0 - 36.0 g/dL   RDW 23.5 36.1 - 44.3 %   Platelets 225 150 - 400 K/uL   nRBC 0.0 0.0 - 0.2 %   Neutrophils Relative % 54 %   Neutro Abs 4.0 1.7 - 7.7 K/uL   Lymphocytes Relative 39 %   Lymphs Abs 2.9 0.7 - 4.0 K/uL   Monocytes Relative 5 %   Monocytes Absolute 0.4 0 - 1 K/uL   Eosinophils Relative 2 %    Eosinophils Absolute 0.1 0 - 0 K/uL   Basophils Relative 0 %   Basophils Absolute 0.0 0 - 0 K/uL   Immature Granulocytes 0 %   Abs Immature Granulocytes 0.02 0.00 - 0.07 K/uL    Comment: Performed at [facility], [address], [place], [place] [address]  Ethanol     Status: Abnormal   Collection Time: 01/29/20  2:35 AM  Result Value Ref Range   Alcohol, Ethyl (B) 67 (H) <10 mg/dL    Comment: (NOTE) Lowest detectable limit for serum alcohol is 10 mg/dL.  For medical purposes only. Performed at [facility], [address] [place], [place] [address]   Salicylate level     Status: Abnormal   Collection Time: 01/29/20  2:35 AM  Result Value Ref Range   Salicylate Lvl <7.0 (L) 7.0 - 30.0 mg/dL    Comment: Performed at [facility], [address], [place], [place] [address]  Acetaminophen level     Status: Abnormal   Collection Time: 01/29/20  2:35 AM  Result Value Ref Range   Acetaminophen (Tylenol), Serum <10 (L) 10 - 30 ug/mL    Comment: (NOTE) Therapeutic concentrations vary significantly. A range of 10-30 ug/mL  may be an effective concentration for many patients. However, some  are best treated at concentrations outside of this range. Acetaminophen concentrations >150 ug/mL at 4 hours after ingestion  and >50 ug/mL at 12 hours after ingestion are often associated with  toxic reactions.  Performed at [facility], [address] [place], [place] [address]   Rapid urine drug screen (hospital performed)     Status: Abnormal   Collection Time: 01/29/20  2:35 AM  Result Value Ref Range   Opiates NONE DETECTED NONE DETECTED   Cocaine POSITIVE (A) NONE DETECTED   Benzodiazepines POSITIVE (A) NONE DETECTED   Amphetamines NONE DETECTED NONE  DETECTED   Tetrahydrocannabinol NONE DETECTED NONE DETECTED   Barbiturates NONE DETECTED NONE DETECTED    Comment: (NOTE) DRUG SCREEN FOR MEDICAL  PURPOSES ONLY.  IF CONFIRMATION IS NEEDED FOR ANY PURPOSE, NOTIFY LAB WITHIN 5 DAYS.  LOWEST DETECTABLE LIMITS FOR URINE DRUG SCREEN Drug Class                     Cutoff (ng/mL) Amphetamine and metabolites    1000 Barbiturate and metabolites    200 Benzodiazepine                 200 Tricyclics and metabolites     300 Opiates and metabolites        300 Cocaine and metabolites        300 THC                            50 Performed at [facility], [address] [place], [place] [address]   I-Stat Beta hCG blood, ED ([facility], [facility], [facility] only)     Status: None   Collection Time: 01/29/20  2:48 AM  Result Value Ref Range   I-stat hCG, quantitative <5.0 <5 mIU/mL   Comment 3            Comment:   GEST. AGE      CONC.  (mIU/mL)   <=1 WEEK        5 - 50     2 WEEKS       50 - 500     3 WEEKS       100 - 10,000     4 WEEKS     1,000 - 30,000        FEMALE AND NON-PREGNANT FEMALE:     LESS THAN 5 mIU/mL   Comprehensive metabolic panel     Status: Abnormal   Collection Time: 01/29/20  3:14 AM  Result Value Ref Range   Sodium 140 135 - 145 mmol/L   Potassium 3.4 (L) 3.5 - 5.1 mmol/L   Chloride 110 98 - 111 mmol/L   CO2 21 (L) 22 - 32 mmol/L   Glucose, Bld 81 70 - 99 mg/dL    Comment: Glucose reference range applies only to samples taken after fasting for at least 8 hours.   BUN 12 6 - 20 mg/dL   Creatinine, Ser 8.78 0.44 - 1.00 mg/dL   Calcium 7.1 (L) 8.9 - 10.3 mg/dL   Total Protein 6.0 (L) 6.5 - 8.1 g/dL   Albumin 3.3 (L) 3.5 - 5.0 g/dL   AST 14 (L) 15 - 41 U/L   ALT 18 0 - 44 U/L   Alkaline Phosphatase 58 38 - 126 U/L   Total Bilirubin 0.4 0.3 - 1.2 mg/dL   GFR calc non Af Amer >60 >60 mL/min   GFR calc Af Amer >60 >60 mL/min   Anion gap 9 5 - 15    Comment: Performed at [facility], [address] [place], [place] [address]  SARS Coronavirus 2 by RT PCR (hospital order, performed in [facility] hospital lab) Nasopharyngeal Nasopharyngeal  Swab     Status: None   Collection Time: 01/29/20  1:36 PM   Specimen: Nasopharyngeal Swab  Result Value Ref Range   SARS Coronavirus 2 NEGATIVE NEGATIVE    Comment: (NOTE) SARS-CoV-2 target nucleic acids are NOT DETECTED.  The SARS-CoV-2 RNA is generally detectable in upper and  lower respiratory specimens during the acute phase of infection. The lowest concentration of SARS-CoV-2 viral copies this assay can detect is 250 copies / mL. A negative result does not preclude SARS-CoV-2 infection and should not be used as the sole basis for treatment or other patient management decisions.  A negative result may occur with improper specimen collection / handling, submission of specimen other than nasopharyngeal swab, presence of viral mutation(s) within the areas targeted by this assay, and inadequate number of viral copies (<250 copies / mL). A negative result must be combined with clinical observations, patient history, and epidemiological information.  Fact Sheet for Patients:   BoilerBrush.com.cy  Fact Sheet for Healthcare Providers: https://pope.com/  This test is not yet approved or  cleared by the [place] FDA and has been authorized for detection and/or diagnosis of SARS-CoV-2 by FDA under an Emergency Use Authorization (EUA).  This EUA will remain in effect (meaning this test can be used) for the duration of the COVID-19 declaration under Section 564(b)(1) of the Act, 21 U.S.C. section 360bbb-3(b)(1), unless the authorization is terminated or revoked sooner.  Performed at [facility], [address] [place], [place] [address]     Blood Alcohol level:  Lab Results  Component Value Date   ETH 67 (H) 01/29/2020   ETH 75 (H) 06/21/2013    Metabolic Disorder Labs:  Lab Results  Component Value Date   HGBA1C 5.5 11/13/2014   No results found for: PROLACTIN Lab Results  Component Value Date    CHOL 211 (H) 11/13/2014   TRIG 362 (H) 11/13/2014   HDL 21 (L) 11/13/2014   CHOLHDL 10.0 11/13/2014   VLDL 72 (H) 11/13/2014   LDLCALC 118 (H) 11/13/2014    Current Medications: Current Facility-Administered Medications  Medication Dose Route Frequency Provider Last Rate Last Admin  . acetaminophen (TYLENOL) tablet 650 mg  650 mg Oral Q6H PRN Jackelyn Poling, NP      . alum & mag hydroxide-simeth (MAALOX/MYLANTA) 200-200-20 MG/5ML suspension 30 mL  30 mL Oral Q4H PRN Nira Conn A, NP      . chlordiazePOXIDE (LIBRIUM) capsule 25 mg  25 mg Oral Q6H PRN Nira Conn A, NP      . chlordiazePOXIDE (LIBRIUM) capsule 25 mg  25 mg Oral QID Jackelyn Poling, NP       Followed by  . [START ON 01/31/2020] chlordiazePOXIDE (LIBRIUM) capsule 25 mg  25 mg Oral TID Jackelyn Poling, NP       Followed by  . [START ON 02/01/2020] chlordiazePOXIDE (LIBRIUM) capsule 25 mg  25 mg Oral BH-qamhs Jackelyn Poling, NP       Followed by  . [START ON 02/03/2020] chlordiazePOXIDE (LIBRIUM) capsule 25 mg  25 mg Oral Daily Nira Conn A, NP      . gabapentin (NEURONTIN) capsule 200 mg  200 mg Oral TID Nira Conn A, NP      . hydrOXYzine (ATARAX/VISTARIL) tablet 25 mg  25 mg Oral Q6H PRN Nira Conn A, NP   25 mg at 01/29/20 2327  . lamoTRIgine (LAMICTAL) tablet 50 mg  50 mg Oral Daily Nira Conn A, NP      . loperamide (IMODIUM) capsule 2-4 mg  2-4 mg Oral PRN Nira Conn A, NP      . magnesium hydroxide (MILK OF MAGNESIA) suspension 30 mL  30 mL Oral Daily PRN Nira Conn A, NP      . multivitamin with minerals tablet 1 tablet  1 tablet Oral Daily Nira Conn A, NP      . ondansetron (ZOFRAN-ODT) disintegrating tablet 4 mg  4 mg Oral Q6H PRN Nira Conn A, NP      . traZODone (DESYREL) tablet 100 mg  100 mg Oral QHS PRN Nira Conn A, NP   100 mg at 01/29/20 2327   Facility-Administered Medications Ordered in Other Encounters  Medication Dose Route Frequency Provider Last Rate Last Admin  . naproxen  (NAPROSYN) tablet 375 mg  375 mg Oral TID WC Verne Spurr T, PA-C       PTA Medications: Facility-Administered Medications Prior to Admission  Medication Dose Route Frequency Provider Last Rate Last Admin  . 0.9 %  sodium chloride infusion  500 mL Intravenous Continuous Pyrtle, Carie Caddy, MD       Medications Prior to Admission  Medication Sig Dispense Refill Last Dose  . ALPRAZolam (XANAX) 1 MG tablet Take 1 mg by mouth 4 (four) times daily as needed.      . Alum & Mag Hydroxide-Simeth (GI COCKTAIL) SUSP suspension Take 30 mLs 2 (two) times daily as needed by mouth for indigestion. Shake well. (Patient not taking: Reported on 01/29/2020) 300 mL 0   . omeprazole (PRILOSEC) 40 MG capsule TAKE 1 CAPSULE BY MOUTH EVERY DAY (Patient not taking: Reported on 01/29/2020) 30 capsule 2   . ondansetron (ZOFRAN-ODT) 4 MG disintegrating tablet TAKE ONE TABLET BY MOUTH EVERY 8 HOURS AS NEEDED FOR NAUSEA OR VOMITING (Patient not taking: Reported on 01/29/2020) 30 tablet 2   . sucralfate (CARAFATE) 1 g tablet Take 1 tablet (1 g total) 4 (four) times daily by mouth. (Patient not taking: Reported on 01/29/2020) 120 tablet 1     Musculoskeletal: Strength & Muscle Tone: within normal limits- no noted tremors , no diaphoresis, no psychomotor restlessness at this time Gait & Station: normal Patient leans: N/A  Psychiatric Specialty Exam: Physical Exam  Review of Systems  Constitutional: Negative.   HENT: Negative.   Eyes: Negative.   Respiratory: Negative.  Negative for cough and shortness of breath.   Cardiovascular: Negative.  Negative for chest pain.  Gastrointestinal: Negative.  Negative for diarrhea, nausea and vomiting.  Endocrine: Negative.   Genitourinary: Negative.   Musculoskeletal: Negative.   Skin: Negative.  Negative for rash.  Neurological: Negative.  Negative for seizures.  Hematological: Negative.   Psychiatric/Behavioral:       S/P Overdose     Blood pressure 121/82, pulse 84,  temperature 98.4 F (36.9 C), temperature source Oral, resp. rate 20, height  (1.676 m), weight 78 kg, last menstrual period 01/21/2020, SpO2 99 %.Body mass index is 27.76 kg/m.  General Appearance: Fairly Groomed  Eye Contact:  Fair improves partially during session  Speech:  Normal Rate  Volume:  Normal  Mood:  Denies depression, presents dysphoric  Affect:  Congruent  Thought Process:  Linear and Descriptions of Associations: Intact  Orientation:  Full (Time, Place, and Person)  Thought Content:  Denies hallucinations, no delusions are expressed, does not appear internally preoccupied  Suicidal Thoughts:  No at this time denies suicidal ideations or self-injurious ideations and reiterates that recent overdose was not suicidal in intent  Homicidal Thoughts:  No denies any homicidal ideations, specifically also denies any violent or homicidal ideations towards husband  Memory:  Recent and remote fair  Judgement:  Fair  Insight:  Fair  Psychomotor Activity:  Normal-she is not currently presenting with symptoms of benzodiazepine withdrawal, no tremors, no diaphoresis, no  psychomotor restlessness  Concentration:  Concentration: Fair and Attention Span: Fair  Recall:  [employer] of Knowledge:  Good  Language:  Good  Akathisia:  Negative  Handed:  Right  AIMS (if indicated):     Assets:  Communication Skills Desire for Improvement Resilience  ADL's:  Intact  Cognition:  WNL  Sleep:       Treatment Plan Summary: Daily contact with patient to assess and evaluate symptoms and progress in treatment, Medication management, Plan inpatient treatment\ and medications as below  Observation Level/Precautions:  15 minute checks  Laboratory:  as needed -Labs reviewed.   Psychotherapy: Milieu/group therapy  Medications: Standing Librium taper as per detox protocol.  Patient also reports had been taking Lamotrigine and Neurontin- denies side effects. These are currently not on Home Med Rec  sheet. Will review with pharmacist . For now continue Lamotrigine 50 mgrs QDAY .   Continue Neurontin currently at 200 mg TID.    Consultations:  As needed   Discharge Concerns:  -  Estimated LOS: 4  Other:     Physician Treatment Plan for Primary Diagnosis: S/P BZD Overdose  Long Term Goal(s): Improvement in symptoms so as ready for discharge  Short Term Goals: Ability to identify changes in lifestyle to reduce recurrence of condition will improve, Ability to verbalize feelings will improve, Ability to disclose and discuss suicidal ideas, Ability to demonstrate self-control will improve, Ability to identify and develop effective coping behaviors will improve, Ability to maintain clinical measurements within normal limits will improve and Compliance with prescribed medications will improve  Physician Treatment Plan for Secondary Diagnosis: Bipolar Disorder by history Long Term Goal(s): Improvement in symptoms so as ready for discharge  Short Term Goals: Ability to identify changes in lifestyle to reduce recurrence of condition will improve, Ability to verbalize feelings will improve, Ability to disclose and discuss suicidal ideas, Ability to demonstrate self-control will improve, Ability to identify and develop effective coping behaviors will improve, Ability to maintain clinical measurements within normal limits will improve and Compliance with prescribed medications will improve  I certify that inpatient services furnished can reasonably be expected to improve the patient's condition.    Craige Cotta, MD 6/15/20218:06 AM

## 2020-01-30 NOTE — Progress Notes (Signed)
[name] is a 39 y.o. female involuntary admitted for suicide attempt by overdosing on her Xanax. Pt stated it was not an attempt to kill herself. She stated she  was tired and took three xanax tabs which were left in the bottle, but her thought she wanted to kill herself and called the police. Pt is very upset and tearful. Pt has been cooperative with admission process, alert and oriented x 2. Denies SI/HI,AVH and verbally contracted for safety. Consents signed, skin/belongings search completed and pt oriented to unit. Pt stable at this time. Pt given the opportunity to express concerns and ask questions. Pt given toiletries. Will continue to monitor.

## 2020-01-31 DIAGNOSIS — F332 Major depressive disorder, recurrent severe without psychotic features: Secondary | ICD-10-CM

## 2020-01-31 MED ORDER — NICOTINE 21 MG/24HR TD PT24
MEDICATED_PATCH | TRANSDERMAL | Status: AC
  Filled 2020-01-31: qty 1

## 2020-01-31 MED ORDER — NICOTINE 21 MG/24HR TD PT24
21.0000 mg | MEDICATED_PATCH | Freq: Every day | TRANSDERMAL | Status: DC
  Administered 2020-01-31: 21 mg via TRANSDERMAL
  Filled 2020-01-31 (×4): qty 1

## 2020-01-31 NOTE — [facility] Counselor (Signed)
Adult Comprehensive Assessment  Patient ID: [name], female   DOB: [birth year], 39 y.o.   MRN: [id number]  Information Source: Information source: Patient  Current Stressors:  Patient states their primary concerns and needs for treatment are:: "I got drunk and took a couple of Xanax and made a stupid statement. It was all a big misunderstanding. I do not need to be here" Patient states their goals for this hospitilization and ongoing recovery are:: "Nothing really, I just want to discharge so I can get home to my 41 year old son" Educational / Learning stressors: N/A Employment / Job issues: Employed; Reports her and her husband's company is failing. Patient identified this as her main stressor Family Relationships: Reports she and her husband have argued more frequently due to their family business "failing" and other financial stressors Financial / Lack of resources (include bankruptcy): Reports she and her husband's finances are currently strained due to their family business struggling Housing / Lack of housing: Currently lives with her spouse and 40yo son; Denies any current stressors Physical health (include injuries & life threatening diseases): Denies any current stressors Social relationships: Denies any current stressors Substance abuse: Endorsed occassional drinking (ETOH); Reports she drinks 2-3 beers after work to "Decompress" Bereavement / Loss: Reports her parents passed away when she was 52yo and 41yo; States she continues to struggle with their deaths.  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  Childhood History:  By whom was/is the patient raised?: Both parents Description of patient's relationship with caregiver when they were a child: Reports having a "blessed" childhood. She reports having a loving and close relationship with both parents during her childhood. Patient's description of current relationship with people who raised him/her: Reports both parents are currently deceased How were you disciplined when you got in trouble as a child/adolescent?: Spankings; Verbally Does patient have siblings?: Yes Number of Siblings: 1 Description of patient's current relationship with siblings: Reports she does not have a relationship with her older brother, who is her remaining living sibling. Patient shared that both of her sisters passed away within one month of each other back in 2014. 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; 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) How long has  patient been employed?: 2 years Patient's job has been impacted by current illness: Yes Describe how patient's job has been impacted: Reports she is "stressed out" What is the longest time patient has a held a job?: 20 years 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 Has patient ever been in the [employer]?: No  Financial Resources:   Surveyor, quantity resources: Income from employment, Income from spouse Does patient have a representative payee or guardian?: No  Alcohol/Substance Abuse:   What has been your use of drugs/alcohol within the last 12 months?: Endorsed occassional drinking (ETOH); Reports she drinks 2-3 beers after work to "Decompress"; Patient also endorsed taking her prescribed dosages of Xanax on a daily basis If attempted suicide, did drugs/alcohol play a role in this?: No Alcohol/Substance Abuse Treatment Hx: Past Tx, Inpatient If yes, describe treatment: [name]- 2014 Has alcohol/substance abuse ever caused legal problems?: No  Social Support System:   Conservation officer, nature Support System: Good Describe Community Support System: "My friends, I have people I can lean on" Type of faith/religion: Christianity How does patient's faith help to cope with current illness?: Prayer  Leisure/Recreation:   Do You Have Hobbies?: Yes Leisure and Hobbies: "I've lost interest in all of the things I use to do. I am trying to get back to that"  Strengths/Needs:   What is the patient's perception of their strengths?: "I am strong-willed, I am confident and I am a good listener" Patient states they can use these personal strengths during their treatment to contribute to their recovery: Yes Patient states these barriers may affect/interfere with their treatment: Yes; Patient reports she wants to discharge as soon as possible. She states that she was brought her due to a  "misunderstanding" Patient states these barriers may affect their return to the community: No Other important information patient would like considered in planning for their treatment: No  Discharge Plan:   Currently receiving community mental health services: Yes (From Whom) (Dr. Milagros Evener for medication management services) Patient states concerns and preferences for aftercare planning are: Patient reports she will like to continue to follow up with her current provider Patient states they will know when they are safe and ready for discharge when: Yes; "As soon as possible" Does patient have access to transportation?: Yes Does patient have financial barriers related to discharge medications?: Yes Patient description of barriers related to discharge medications: No health insurance Will patient be returning to same living situation after discharge?: Yes  Summary/Recommendations:   Summary and Recommendations (to be completed by the evaluator): [name] is a 39 year old female who is diagnosed with S/P BZD Overdose. She presented to the hospital seeking treatment for an alprazolam overdose. During the assessment, [name] was pleasant and cooperative with providing information. [name] states that she and her husband had a verbal disagreement regarding their ongoing issues with their business and finances, while she was intoxicated. [name] shared that in the moment she felt stressed and overwhelmed, which prompted her to drink more alcohol than she normally does. [name] states "I must of said something about hurting myself becuase the next thing I know I wake up in the hospital". [name] reports that she was not suicidal when she accidentally overdosed on her Xanax prescription while intoxicated. She reports that she does not want to remain in the hospital because "I do not need to be here this time". [name] follows up with Dr. Milagros Evener for medication management services. [name] can benefit  from crisis stabilization, medication  management, therapeutic milieu and referral services.  [name]. 01/31/2020

## 2020-01-31 NOTE — Progress Notes (Addendum)
   01/31/20 1600  Psych Admission Type (Psych Patients Only)  Admission Status Involuntary  Psychosocial Assessment  Patient Complaints None  Eye Contact Brief  Facial Expression Anxious;Sad;Pained  Affect Depressed;Sad;Irritable  Speech Logical/coherent  Interaction Assertive  Motor Activity Slow  Appearance/Hygiene In scrubs  Behavior Characteristics Cooperative;Appropriate to situation  Mood Depressed  Aggressive Behavior  Targets Self  Type of Behavior Other (Comment) (sleeping all day)  Effect No apparent injury  Thought Process  Coherency WDL  Content WDL  Delusions None reported or observed  Perception WDL  Hallucination None reported or observed  Judgment WDL  Confusion None  Danger to Self  Current suicidal ideation? Denies  Danger to Others  Danger to Others None reported or observed    Pt has been calm and cooperative- pleasant during interactions- observed in the milieu interacting well with peers. Per pt's self inventory, pt rated her depression, hopelessness and anxiety a 2/0/3, respectively. Pt writes that her goal today is "go home". Pt denies SI/HI and A/VH. Pt remains safe on the unit with 15 min checks.

## 2020-01-31 NOTE — Progress Notes (Signed)
[facility] MD Progress Note  01/31/2020 1:37 PM [name]  MRN:  [id number]  Subjective: [name] reports, "My being here is a big misunderstanding. I was not actually trying to harm myself in any way, rather, I said something to my husband that I did not mean & should not be saying. I guess, everybody is trying to say I was a danger to myself. I disagree completely. I was not really depressed then, but I'm depressed now because I'm not with my son. I'm ready to be discharged from here. There is no way that I'm going to harm myself or anyone else"  Objective: Patient is a 39 year old female, presented to hospital on 6/14 following alprazolam overdose . She reports she is prescribed Xanax 1 mgr QID. She states she was irritated at her husband because he took some of this medication and she states she took a handful " I put them in my mouth but I think I spit them out". (As per chart notes, Xanax prescription bottle that EMS brought in indicated it was filled 01/18/2020 # 120 tablets and was empty).  She reiterates she did not have any suicidal ideations and that this was not suicidal . States " it was kind of if you take some then I'll  take some".  [name] is seen, chart review. The chart findings discussed with the treatment team. She presents alert, oriented & aware of situation. She is visible on the unit, attending group sessions. She says she is doing well. Denies any symptoms of depression or anxiety. Says the thing that is making her feel depressed is because she is not with her son at this time. She states that her admission here was out of misunderstanding. She says she & her husband were having a fight & she said certain things to him that she should not have sad & her husband took it seriously. She down played the fact that she intentionally took an overdose of her prescription medications. She presents with a flat affect. Says she slept well last night. She currently denies any SIHI, AVH, delusional  thoughts or paranoia. She does not appear to be responding to any internal stimuli. [name] is taking & tolerating her treatment regimen. She denies any side effects. She is in agreement to continue her current plan of care as already in progress.  Principal Problem: Severe recurrent major depression without psychotic features (HCC)  Diagnosis: Principal Problem:   Severe recurrent major depression without psychotic features (HCC)  Total Time spent with patient: 25 minutes  Past Psychiatric History: See H&P  Past Medical History:  Past Medical History:  Diagnosis Date  . Alcohol abuse   . History of cervical dysplasia    CIN I  in  2004  . Inflamed external hemorrhoid   . Inflamed internal hemorrhoid   . Major depression   . PTSD (post-traumatic stress disorder)   . Rectal pain   . Scoliosis 11/12/2012    Past Surgical History:  Procedure Laterality Date  . APPENDECTOMY  1994  . CERVICAL BIOPSY  W/ LOOP ELECTRODE EXCISION  06-11-2003  . EVALUATION UNDER ANESTHESIA WITH HEMORRHOIDECTOMY N/A 01/22/2014   Procedure: EXAM UNDER ANESTHESIA WITH HEMORRHOIDECTOMY;  Surgeon: Romie Levee, MD;  Location: [facility];  Service: General;  Laterality: N/A;  . TUBAL LIGATION  05-08-2005   W/  FILSHIE CLIPS   Family History:  Family History  Problem Relation Age of Onset  . Cancer Father  prostate  . Cancer Sister        bladder  . Lung disease Mother   . Cancer Maternal Uncle        Lung  . Cancer Paternal Aunt        Breast  . Cancer Maternal Grandfather   . Cancer Paternal Grandfather   . Cancer Paternal Aunt        Breast   Family Psychiatric  History: See H&P  Social History:  Social History   Substance and Sexual Activity  Alcohol Use Yes  . Alcohol/week: 2.0 standard drinks  . Types: 2 Cans of beer per week   Comment: hx   alcohol abuse-- per pt has cut back     Social History   Substance and Sexual Activity  Drug Use Yes  . Types:  Marijuana   Comment: pt denies (there is documented hx cannibus use)    Social History   Socioeconomic History  . Marital status: Married    Spouse name: Not on file  . Number of children: Not on file  . Years of education: Not on file  . Highest education level: Not on file  Occupational History  . Not on file  Tobacco Use  . Smoking status: Current Every Day Smoker    Packs/day: 0.50    Years: 15.00    Pack years: 7.50    Types: Cigarettes  . Smokeless tobacco: Never Used  Substance and Sexual Activity  . Alcohol use: Yes    Alcohol/week: 2.0 standard drinks    Types: 2 Cans of beer per week    Comment: hx   alcohol abuse-- per pt has cut back  . Drug use: Yes    Types: Marijuana    Comment: pt denies (there is documented hx cannibus use)  . Sexual activity: Yes    Birth control/protection: Surgical  Other Topics Concern  . Not on file  Social History Narrative   Pt also has hx of an aunt and uncle with lung cancer.          Social Determinants of Health   Financial Resource Strain:   . Difficulty of Paying Living Expenses:   Food Insecurity:   . Worried About Charity fundraiser in the Last Year:   . Arboriculturist in the Last Year:   Transportation Needs:   . Film/video editor (Medical):   Marland Kitchen Lack of Transportation (Non-Medical):   Physical Activity:   . Days of Exercise per Week:   . Minutes of Exercise per Session:   Stress:   . Feeling of Stress :   Social Connections:   . Frequency of Communication with Friends and Family:   . Frequency of Social Gatherings with Friends and Family:   . Attends Religious Services:   . Active Member of Clubs or Organizations:   . Attends Archivist Meetings:   Marland Kitchen Marital Status:    Additional Social History:    Pain Medications: See MAR Prescriptions: See MAR Over the Counter: See MAR History of alcohol / drug use?: Yes Negative Consequences of Use: Personal relationships Withdrawal Symptoms:  Irritability, Agitation  Sleep: Good  Appetite:  Good  Current Medications: Current Facility-Administered Medications  Medication Dose Route Frequency Provider Last Rate Last Admin  . acetaminophen (TYLENOL) tablet 650 mg  650 mg Oral Q6H PRN Lindon Romp A, NP      . alum & mag hydroxide-simeth (MAALOX/MYLANTA) 200-200-20 MG/5ML suspension 30 mL  30 mL  Oral Q4H PRN Nira ConnBerry, Jason A, NP      . chlordiazePOXIDE (LIBRIUM) capsule 25 mg  25 mg Oral Q6H PRN Nira ConnBerry, Jason A, NP   25 mg at 01/31/20 0859  . chlordiazePOXIDE (LIBRIUM) capsule 25 mg  25 mg Oral TID Jackelyn PolingBerry, Jason A, NP       Followed by  . [START ON 02/01/2020] chlordiazePOXIDE (LIBRIUM) capsule 25 mg  25 mg Oral BH-qamhs Jackelyn PolingBerry, Jason A, NP       Followed by  . [START ON 02/03/2020] chlordiazePOXIDE (LIBRIUM) capsule 25 mg  25 mg Oral Daily Nira ConnBerry, Jason A, NP      . gabapentin (NEURONTIN) capsule 200 mg  200 mg Oral TID Cobos, Rockey SituFernando A, MD   200 mg at 01/31/20 1223  . hydrOXYzine (ATARAX/VISTARIL) tablet 25 mg  25 mg Oral Q6H PRN Nira ConnBerry, Jason A, NP   25 mg at 01/31/20 1025  . lamoTRIgine (LAMICTAL) tablet 50 mg  50 mg Oral Daily Nira ConnBerry, Jason A, NP   50 mg at 01/31/20 0900  . loperamide (IMODIUM) capsule 2-4 mg  2-4 mg Oral PRN Nira ConnBerry, Jason A, NP      . magnesium hydroxide (MILK OF MAGNESIA) suspension 30 mL  30 mL Oral Daily PRN Nira ConnBerry, Jason A, NP   30 mL at 01/31/20 0901  . multivitamin with minerals tablet 1 tablet  1 tablet Oral Daily Nira ConnBerry, Jason A, NP   1 tablet at 01/31/20 0900  . ondansetron (ZOFRAN-ODT) disintegrating tablet 4 mg  4 mg Oral Q6H PRN Jackelyn PolingBerry, Jason A, NP       Facility-Administered Medications Ordered in Other Encounters  Medication Dose Route Frequency Provider Last Rate Last Admin  . naproxen (NAPROSYN) tablet 375 mg  375 mg Oral TID WC Tamala JulianMashburn, Neil T, PA-C       Lab Results:  Results for orders placed or performed during the hospital encounter of 01/29/20 (from the past 48 hour(s))  Lipid panel     Status:  Abnormal   Collection Time: 01/30/20  6:33 PM  Result Value Ref Range   Cholesterol 198 0 - 200 mg/dL   Triglycerides 161394 (H) <150 mg/dL   HDL 37 (L) >09>40 mg/dL   Total CHOL/HDL Ratio 5.4 RATIO   VLDL 79 (H) 0 - 40 mg/dL   LDL Cholesterol 82 0 - 99 mg/dL    Comment:        Total Cholesterol/HDL:CHD Risk Coronary Heart Disease Risk Table                     Men   Women  1/2 Average Risk   3.4   3.3  Average Risk       5.0   4.4  2 X Average Risk   9.6   7.1  3 X Average Risk  23.4   11.0        Use the calculated Patient Ratio above and the CHD Risk Table to determine the patient's CHD Risk.        ATP III CLASSIFICATION (LDL):  <100     mg/dL   Optimal  [id number]  mg/dL   Near or Above                    Optimal  130-159  mg/dL   Borderline  [id number]  mg/dL   High  >478>190     mg/dL   Very High Performed at [facility], [address] [place],  [place] [address]   TSH     Status: None   Collection Time: 01/30/20  6:33 PM  Result Value Ref Range   TSH 1.267 0.350 - 4.500 uIU/mL    Comment: Performed by a 3rd Generation assay with a functional sensitivity of <=0.01 uIU/mL. Performed at [facility], [address], [place], [place] [address]    Blood Alcohol level:  Lab Results  Component Value Date   ETH 67 (H) 01/29/2020   ETH 75 (H) 06/21/2013   Metabolic Disorder Labs: Lab Results  Component Value Date   HGBA1C 5.5 11/13/2014   No results found for: PROLACTIN Lab Results  Component Value Date   CHOL 198 01/30/2020   TRIG 394 (H) 01/30/2020   HDL 37 (L) 01/30/2020   CHOLHDL 5.4 01/30/2020   VLDL 79 (H) 01/30/2020   LDLCALC 82 01/30/2020   LDLCALC 118 (H) 11/13/2014   Physical Findings: AIMS: Facial and Oral Movements Muscles of Facial Expression: None, normal Lips and Perioral Area: None, normal Jaw: None, normal Tongue: None, normal,Extremity Movements Upper (arms, wrists, hands, fingers): None, normal Lower (legs,  knees, ankles, toes): None, normal, Trunk Movements Neck, shoulders, hips: None, normal, Overall Severity Severity of abnormal movements (highest score from questions above): None, normal Incapacitation due to abnormal movements: None, normal Patient's awareness of abnormal movements (rate only patient's report): No Awareness, Dental Status Current problems with teeth and/or dentures?: No Does patient usually wear dentures?: No  CIWA:  CIWA-Ar Total: 1 COWS:  COWS Total Score: 3  Musculoskeletal: Strength & Muscle Tone: within normal limits Gait & Station: normal Patient leans: N/A  Psychiatric Specialty Exam: Physical Exam  Nursing note and vitals reviewed. Constitutional: She is oriented to person, place, and time.  HENT:  Head: Normocephalic.  Mouth/Throat: Oropharynx is clear.  Eyes: Pupils are equal, round, and reactive to light.  Cardiovascular: Normal pulses.  Respiratory: No respiratory distress. She has no wheezes.  GI: Normal appearance.  Genitourinary:    Genitourinary Comments: Deferred   Musculoskeletal:        General: Normal range of motion.     Cervical back: Normal range of motion.  Neurological: She is alert and oriented to person, place, and time.  Skin: Skin is warm and dry.    Review of Systems  Constitutional: Negative for chills and fever.  HENT: Negative for congestion, rhinorrhea, sneezing and sore throat.   Eyes: Negative for discharge.  Respiratory: Negative for cough, chest tightness, shortness of breath and wheezing.   Cardiovascular: Negative for chest pain and palpitations.  Gastrointestinal: Negative for diarrhea, nausea and vomiting.  Endocrine: Negative for cold intolerance and heat intolerance.  Genitourinary: Negative for difficulty urinating.  Musculoskeletal: Negative for arthralgias and myalgias.  Skin: Negative for color change.  Allergic/Immunologic: Negative for environmental allergies and food allergies.       Allergies: NKDA   Neurological: Negative for dizziness, tremors, seizures, syncope, speech difficulty, light-headedness and headaches.  Psychiatric/Behavioral: Positive for dysphoric mood ("Improving"). Negative for agitation, behavioral problems, confusion, decreased concentration, hallucinations, self-injury, sleep disturbance and suicidal ideas. The patient is not nervous/anxious and is not hyperactive.     Blood pressure (!) 122/92, pulse 98, temperature 97.8 F (36.6 C), temperature source Oral, resp. rate 20, height 5\' 6"  (1.676 m), weight 78 kg, last menstrual period 01/21/2020, SpO2 99 %.Body mass index is 27.76 kg/m.  General Appearance: Fairly Groomed  Eye Contact:  Fair improves partially during session  Speech:  Normal Rate  Volume:  Normal  Mood:  Denies depression, presents dysphoric  Affect:  Congruent  Thought Process:  Linear and Descriptions of Associations: Intact  Orientation:  Full (Time, Place, and Person)  Thought Content:  Denies hallucinations, no delusions are expressed, does not appear internally preoccupied  Suicidal Thoughts:  No at this time denies suicidal ideations or self-injurious ideations and reiterates that recent overdose was not suicidal in intent  Homicidal Thoughts:  No denies any homicidal ideations, specifically also denies any violent or homicidal ideations towards husband  Memory:  Recent and remote fair  Judgement:  Fair  Insight:  Fair  Psychomotor Activity:  Normal-she is not currently presenting with symptoms of benzodiazepine withdrawal, no tremors, no diaphoresis, no psychomotor restlessness  Concentration:  Concentration: Fair and Attention Span: Fair  Recall:  [employer] of Knowledge:  Good  Language:  Good  Akathisia:  Negative  Handed:  Right  AIMS (if indicated):     Assets:  Communication Skills Desire for Improvement Resilience  ADL's:  Intact  Cognition:  WNL    Sleep:  Number of Hours: 6.75   Treatment Plan Summary: Daily contact with  patient to assess and evaluate symptoms and progress in treatment and Medication management.  - Continue inpatient hospitalization. - Will continue today 01/31/2020 plan as below except where it is noted.  Alcohol withdrawal symptoms.    - Continue Librium detox protocols as already in progress.  Agitation.    - Continue gabapentin 200 mg po tid.  Anxiety.    - Continue Vistaril 25 mg po Qid prn.  Mood stabilization.    - Continue Lamictal 50 mg po daily.  Encourage group attendance & participation. Discharge disposition plan in progress.   Armandina Stammer, NP, PMHNP, FNP-BC 01/31/2020, 1:37 PM

## 2020-01-31 NOTE — [facility] Suicide Risk Assessment (Signed)
[facility] INPATIENT:  Family/Significant Other Suicide Prevention Education  Suicide Prevention Education:  Patient Refusal for Family/Significant Other Suicide Prevention Education: The patient [name] has refused to provide written consent for family/significant other to be provided Family/Significant Other Suicide Prevention Education during admission and/or prior to discharge.  Physician notified.  SPE completed with patient, as patient refused to consent to family contact. SPI pamphlet provided to pt and pt was encouraged to share information with support network, ask questions, and talk about any concerns relating to SPE. Patient denies access to guns/firearms and verbalized understanding of information provided. Mobile Crisis information also provided to patient.    [name] 01/31/2020, 10:50 AM

## 2020-01-31 NOTE — Tx Team (Signed)
Interdisciplinary Treatment and Diagnostic Plan Update  01/31/2020 Time of Session: 9:00am [name] MRN: [id number]  Principal Diagnosis: <principal problem not specified>  Secondary Diagnoses: Active Problems:   Severe recurrent major depression without psychotic features (HCC)   Current Medications:  Current Facility-Administered Medications  Medication Dose Route Frequency Provider Last Rate Last Admin  . acetaminophen (TYLENOL) tablet 650 mg  650 mg Oral Q6H PRN Nira Conn A, NP      . alum & mag hydroxide-simeth (MAALOX/MYLANTA) 200-200-20 MG/5ML suspension 30 mL  30 mL Oral Q4H PRN Nira Conn A, NP      . chlordiazePOXIDE (LIBRIUM) capsule 25 mg  25 mg Oral Q6H PRN Nira Conn A, NP   25 mg at 01/31/20 0859  . chlordiazePOXIDE (LIBRIUM) capsule 25 mg  25 mg Oral QID Nira Conn A, NP   25 mg at 01/30/20 2130   Followed by  . chlordiazePOXIDE (LIBRIUM) capsule 25 mg  25 mg Oral TID Jackelyn Poling, NP       Followed by  . [START ON 02/01/2020] chlordiazePOXIDE (LIBRIUM) capsule 25 mg  25 mg Oral BH-qamhs Jackelyn Poling, NP       Followed by  . [START ON 02/03/2020] chlordiazePOXIDE (LIBRIUM) capsule 25 mg  25 mg Oral Daily Nira Conn A, NP      . gabapentin (NEURONTIN) capsule 200 mg  200 mg Oral TID Cobos, Rockey Situ, MD   200 mg at 01/31/20 0900  . hydrOXYzine (ATARAX/VISTARIL) tablet 25 mg  25 mg Oral Q6H PRN Nira Conn A, NP   25 mg at 01/30/20 2132  . lamoTRIgine (LAMICTAL) tablet 50 mg  50 mg Oral Daily Nira Conn A, NP   50 mg at 01/31/20 0900  . loperamide (IMODIUM) capsule 2-4 mg  2-4 mg Oral PRN Nira Conn A, NP      . magnesium hydroxide (MILK OF MAGNESIA) suspension 30 mL  30 mL Oral Daily PRN Nira Conn A, NP   30 mL at 01/31/20 0901  . multivitamin with minerals tablet 1 tablet  1 tablet Oral Daily Nira Conn A, NP   1 tablet at 01/31/20 0900  . ondansetron (ZOFRAN-ODT) disintegrating tablet 4 mg  4 mg Oral Q6H PRN Jackelyn Poling, NP        Facility-Administered Medications Ordered in Other Encounters  Medication Dose Route Frequency Provider Last Rate Last Admin  . naproxen (NAPROSYN) tablet 375 mg  375 mg Oral TID WC Verne Spurr T, PA-C       PTA Medications: Facility-Administered Medications Prior to Admission  Medication Dose Route Frequency Provider Last Rate Last Admin  . [DISCONTINUED] 0.9 %  sodium chloride infusion  500 mL Intravenous Continuous Pyrtle, Carie Caddy, MD       Medications Prior to Admission  Medication Sig Dispense Refill Last Dose  . ALPRAZolam (XANAX) 1 MG tablet Take 1 mg by mouth 4 (four) times daily as needed.      . Alum & Mag Hydroxide-Simeth (GI COCKTAIL) SUSP suspension Take 30 mLs 2 (two) times daily as needed by mouth for indigestion. Shake well. (Patient not taking: Reported on 01/29/2020) 300 mL 0   . omeprazole (PRILOSEC) 40 MG capsule TAKE 1 CAPSULE BY MOUTH EVERY DAY (Patient not taking: Reported on 01/29/2020) 30 capsule 2   . ondansetron (ZOFRAN-ODT) 4 MG disintegrating tablet TAKE ONE TABLET BY MOUTH EVERY 8 HOURS AS NEEDED FOR NAUSEA OR VOMITING (Patient not taking: Reported on 01/29/2020) 30 tablet 2   .  sucralfate (CARAFATE) 1 g tablet Take 1 tablet (1 g total) 4 (four) times daily by mouth. 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   Physician Treatment Plan for Primary Diagnosis: <principal problem not specified> Long Term Goal(s): Improvement in symptoms so as ready for discharge Improvement in symptoms so as ready for discharge   Short Term Goals: Ability to identify changes in lifestyle to reduce recurrence of condition will improve Ability to  verbalize feelings will improve Ability to disclose and discuss suicidal ideas Ability to demonstrate self-control will improve Ability to identify and develop effective coping behaviors will improve Ability to maintain clinical measurements within normal limits will improve Compliance with prescribed medications will improve Ability to identify changes in lifestyle to reduce recurrence of condition will improve Ability to verbalize feelings will improve Ability to disclose and discuss suicidal ideas Ability to demonstrate self-control will improve Ability to identify and develop effective coping behaviors will improve Ability to maintain clinical measurements within normal limits will improve Compliance with prescribed medications will improve  Medication Management: Evaluate patient's response, side effects, and tolerance of medication regimen.  Therapeutic Interventions: 1 to 1 sessions, Unit Group sessions and Medication administration.  Evaluation of Outcomes: Progressing  Physician Treatment Plan for Secondary Diagnosis: Active Problems:   Severe recurrent major depression without psychotic features ([place])  Long Term Goal(s): Improvement in symptoms so as ready for discharge Improvement in symptoms so as ready for discharge   Short Term Goals: Ability to identify changes in lifestyle to reduce recurrence of condition will improve Ability to verbalize feelings will improve Ability to disclose and discuss suicidal ideas Ability to demonstrate self-control will improve Ability to identify and develop effective coping behaviors will improve Ability to maintain clinical measurements within normal limits will improve Compliance with prescribed medications will improve Ability to identify changes in lifestyle to reduce recurrence of condition will improve Ability to verbalize feelings will improve Ability to disclose and discuss suicidal ideas Ability to demonstrate self-control  will improve Ability to identify and develop effective coping behaviors will improve Ability to maintain clinical measurements within normal limits will improve Compliance with prescribed medications will improve     Medication Management: Evaluate patient's response, side effects, and tolerance of medication regimen.  Therapeutic Interventions: 1 to 1 sessions, Unit Group sessions and Medication administration.  Evaluation of Outcomes: Progressing   RN Treatment Plan for Primary Diagnosis: <principal problem not specified> Long Term Goal(s): Knowledge of disease and therapeutic regimen to maintain health will improve  Short Term Goals: Ability to verbalize feelings will improve and Ability to identify and develop effective coping behaviors will improve  Medication Management: RN will administer medications as ordered by provider, will assess and evaluate patient's response and provide education to patient for prescribed medication. RN will report any adverse and/or side effects to prescribing provider.  Therapeutic Interventions: 1 on 1 counseling sessions, Psychoeducation, Medication administration, Evaluate responses to treatment, Monitor vital signs and CBGs as ordered, Perform/monitor CIWA, COWS, AIMS and Fall Risk screenings as ordered, Perform wound care treatments as ordered.  Evaluation of Outcomes: Progressing   LCSW Treatment Plan for Primary Diagnosis: <principal problem not specified> Long Term Goal(s): Safe transition to appropriate next level of care at discharge,  Engage patient in therapeutic group addressing interpersonal concerns.  Short Term Goals: Engage patient in aftercare planning with referrals and resources, Increase social support and Increase skills for wellness and recovery  Therapeutic Interventions: Assess for all discharge needs, 1 to 1 time with Social worker, Explore available resources and support systems, Assess for adequacy in community support  network, Educate family and significant other(s) on suicide prevention, Complete Psychosocial Assessment, Interpersonal group therapy.  Evaluation of Outcomes: Progressing  Progress in Treatment: Attending groups: Yes. Participating in groups: Yes. Taking medication as prescribed: Yes. Toleration medication: Yes. Family/Significant other contact made: No, will contact:  supports if consents are granted. Patient understands diagnosis: Yes. Discussing patient identified problems/goals with staff: Yes. Medical problems stabilized or resolved: Yes. Denies suicidal/homicidal ideation: Yes. Issues/concerns per patient self-inventory: No.  New problem(s) identified: No, Describe:  CSW continuing to assess.  New Short Term/Long Term Goal(s): detox, medication management for mood stabilization; elimination of SI thoughts; development of comprehensive mental wellness/sobriety plan.  Patient Goals:  "Go home to my son."  Discharge Plan or Barriers: Established with [facility] for medication management.  Reason for Continuation of Hospitalization: Anxiety Depression Medication stabilization  Estimated Length of Stay: 1-3 days  Attendees: Patient: [name] 01/31/2020 9:17 AM  Physician: Dr. Jola Babinski 01/31/2020 9:17 AM  Nursing:  01/31/2020 9:17 AM  RN Care Manager: 01/31/2020 9:17 AM  Social Worker: Enid Cutter, LCSWA 01/31/2020 9:17 AM  Recreational Therapist:  01/31/2020 9:17 AM  Other:  01/31/2020 9:17 AM  Other:  01/31/2020 9:17 AM  Other: 01/31/2020 9:17 AM    Scribe for Treatment Team: Darreld Mclean, LCSWA 01/31/2020 9:17 AM

## 2020-01-31 NOTE — Progress Notes (Signed)
   01/30/20 2244  Psych Admission Type (Psych Patients Only)  Admission Status Involuntary  Psychosocial Assessment  Patient Complaints Anxiety;Depression  Eye Contact Brief  Facial Expression Anxious;Sad;Pained  Affect Depressed;Sad;Irritable  Speech Logical/coherent  Interaction Assertive  Motor Activity Slow  Appearance/Hygiene In scrubs  Behavior Characteristics Appropriate to situation  Mood Depressed  Aggressive Behavior  Targets Self  Type of Behavior Other (Comment) (sleeping all day)  Effect No apparent injury  Thought Process  Coherency WDL  Content WDL  Delusions None reported or observed  Perception WDL  Hallucination None reported or observed  Judgment WDL  Confusion None  Danger to Self  Current suicidal ideation? Denies  Danger to Others  Danger to Others None reported or observed

## 2020-02-01 LAB — HEMOGLOBIN A1C
Hgb A1c MFr Bld: 5.2 % (ref 4.8–5.6)
Mean Plasma Glucose: 103 mg/dL

## 2020-02-01 MED ORDER — HYDROXYZINE HCL 25 MG PO TABS: 25.0000 mg | ORAL_TABLET | Freq: Four times a day (QID) | ORAL | 0 refills | Status: DC | PRN

## 2020-02-01 MED ORDER — GABAPENTIN 100 MG PO CAPS: 200.0000 mg | ORAL_CAPSULE | Freq: Three times a day (TID) | ORAL | 0 refills | Status: DC

## 2020-02-01 MED ORDER — NICOTINE 21 MG/24HR TD PT24: 21.0000 mg | MEDICATED_PATCH | Freq: Every day | TRANSDERMAL | 0 refills | Status: DC

## 2020-02-01 MED ORDER — LAMOTRIGINE 25 MG PO TABS: 50.0000 mg | ORAL_TABLET | Freq: Every day | ORAL | 0 refills | Status: DC

## 2020-02-01 NOTE — Progress Notes (Signed)
   01/31/20 1945  COVID-19 Daily Checkoff  Have you had a fever (temp > 37.80C/100F)  in the past 24 hours?  No  COVID-19 EXPOSURE  Have you traveled outside the state in the past 14 days? No  Have you been in contact with someone with a confirmed diagnosis of COVID-19 or PUI in the past 14 days without wearing appropriate PPE? No  Have you been living in the same home as a person with confirmed diagnosis of COVID-19 or a PUI (household contact)? No  Have you been diagnosed with COVID-19? No

## 2020-02-01 NOTE — Discharge Summary (Signed)
Physician Discharge Summary Note   Patient:  [name] is an 39 y.o., female  MRN:  [id number]  DOB:  July 03, 1981  Patient phone:  [phone] (home)   Patient address:   [address] [place] [address],   Total Time spent with patient: Greater than 30 minutes  Date of Admission:  01/29/2020  Date of Discharge: 02-01-20  Reason for Admission: Benzodiazepine overdose.  Principal Problem: Severe recurrent major depression without psychotic features [facility])  Discharge Diagnoses: Principal Problem:   Severe recurrent major depression without psychotic features (HCC)  Past Psychiatric History: Severe recurrent depression.  Past Medical History:  Past Medical History:  Diagnosis Date  . Alcohol abuse   . History of cervical dysplasia    CIN I  in  2004  . Inflamed external hemorrhoid   . Inflamed internal hemorrhoid   . Major depression   . PTSD (post-traumatic stress disorder)   . Rectal pain   . Scoliosis 11/12/2012    Past Surgical History:  Procedure Laterality Date  . APPENDECTOMY  1994  . CERVICAL BIOPSY  W/ LOOP ELECTRODE EXCISION  06-11-2003  . EVALUATION UNDER ANESTHESIA WITH HEMORRHOIDECTOMY N/A 01/22/2014   Procedure: EXAM UNDER ANESTHESIA WITH HEMORRHOIDECTOMY;  Surgeon: Romie Levee, MD;  Location: [facility];  Service: General;  Laterality: N/A;  . TUBAL LIGATION  05-08-2005   W/  FILSHIE CLIPS   Family History:  Family History  Problem Relation Age of Onset  . Cancer Father        prostate  . Cancer Sister        bladder  . Lung disease Mother   . Cancer Maternal Uncle        Lung  . Cancer Paternal Aunt        Breast  . Cancer Maternal Grandfather   . Cancer Paternal Grandfather   . Cancer Paternal Aunt        Breast   Family Psychiatric  History: See H&P  Social History:  Social History   Substance and Sexual Activity  Alcohol Use Yes  . Alcohol/week: 2.0 standard drinks  . Types: 2 Cans of beer per week    Comment: hx   alcohol abuse-- per pt has cut back     Social History   Substance and Sexual Activity  Drug Use Yes  . Types: Marijuana   Comment: pt denies (there is documented hx cannibus use)    Social History   Socioeconomic History  . Marital status: Married    Spouse name: Not on file  . Number of children: Not on file  . Years of education: Not on file  . Highest education level: Not on file  Occupational History  . Not on file  Tobacco Use  . Smoking status: Current Every Day Smoker    Packs/day: 0.50    Years: 15.00    Pack years: 7.50    Types: Cigarettes  . Smokeless tobacco: Never Used  Substance and Sexual Activity  . Alcohol use: Yes    Alcohol/week: 2.0 standard drinks    Types: 2 Cans of beer per week    Comment: hx   alcohol abuse-- per pt has cut back  . Drug use: Yes    Types: Marijuana    Comment: pt denies (there is documented hx cannibus use)  . Sexual activity: Yes    Birth control/protection: Surgical  Other Topics Concern  . Not on file  Social History Narrative   Pt also  has hx of an aunt and uncle with lung cancer.          Social Determinants of Health   Financial Resource Strain:   . Difficulty of Paying Living Expenses:   Food Insecurity:   . Worried About Programme researcher, broadcasting/film/video in the Last Year:   . Barista in the Last Year:   Transportation Needs:   . Freight forwarder (Medical):   Marland Kitchen Lack of Transportation (Non-Medical):   Physical Activity:   . Days of Exercise per Week:   . Minutes of Exercise per Session:   Stress:   . Feeling of Stress :   Social Connections:   . Frequency of Communication with Friends and Family:   . Frequency of Social Gatherings with Friends and Family:   . Attends Religious Services:   . Active Member of Clubs or Organizations:   . Attends Banker Meetings:   Marland Kitchen Marital Status:    Hospital Course: (Per Md's admission evaluation notes): Patient is a 39 year old female,  presented to hospital on 01/29/20 following alprazolam overdose. She reports she is prescribed Xanax 1 mgr QID. She states she was irritated at her husband because he took some of this medication and she states she took a handful " I put them in my mouth but I think I spit them out". (As per chart notes, Xanax prescription bottle that EMS brought in indicated it was filled 01/18/2020 # 120 tablets and was empty).  She reiterates she did not have any suicidal ideations and that this was not suicidal . States " it was kind of if you take some then I'll  take some". She also reports she was drinking , and reports she drinks 2-3 x per week, up to 3-4 beers per episode. Admission BAL 67, admission UDS (+) for BZDs and Cocaine. Currently she denies significant depression leading up to above . She minimizes neuro-vegetative symptoms and describes her mood has been "OK". She denies anhedonia, and states her energy level, her appetite, and her sleep had been within normal, although does state she has been unable to sleep well over the last two days. Denies psychotic symptoms.  This is one of several psychiatric discharge summaries from the [facility] for this 39 year old Caucasian female. However, she has not been admitted in this [facility] since November, 2014. She may have received mental health services from the other mental health hospitals within the surrounding area since that time. She is with hx of chronic mental illness, suicide attempts, substance use disorders & multiple psychiatric admissions. She was brought to the hospital this time around for evaluation & treatment for suicide attempt by overdose on Benzodiazepine. She cited as her trigger, an argument between her & her husband.  After evaluation of her presenting symptoms, [name] was recommended for mood stabilization treatments. The medication regimen for her presenting symptoms were discussed & with her consent initiated. She received, stabilized & was  discharged on the medications as listed below on her discharge medication lists. She was also enrolled & participated in the group counseling sessions being offered & held on this unit. She learned coping skills. She presented on this admission, no other chronic medical conditions that required treatment & monitoring. She tolerated her treatment regimen without any adverse effects or reactions reported.  During the course of her hospitalization, the 15-minute checks were adequate to ensure [name]'s safety.  Patient did not display any dangerous, violent or suicidal  behavior on the unit. She interacted with patients & staff appropriately, participated appropriately in the group sessions/therapies. Her medications were addressed & adjusted to meet her needs. She was recommended for outpatient follow-up care & medication management upon discharge to assure her continuity of care.  At the time of discharge, patient is not reporting any acute suicidal/homicidal ideations. She feels more confident about her self-care & in managing her symptoms. She currently denies any new issues or concerns. Education and supportive counseling provided throughout her hospital stay & upon discharge.  Today upon her discharge evaluation with the attending psychiatrist, [name] shares she is doing well. She denies any other specific concerns. She is sleeping well. Her appetite is good. She denies other physical complaints. She denies AH/VH. She feels that her medications have been helpful & is in agreement to continue her current treatment regimen as recommended. She was able to engage in safety planning including plan to return to [facility] or contact emergency services if she feels unable to maintain her own safety or the safety of others. Pt had no further questions, comments, or concerns. She left [facility] with all personal belongings in no apparent distress. Transportation per [employer].  Physical Findings: AIMS: Facial  and Oral Movements Muscles of Facial Expression: None, normal Lips and Perioral Area: None, normal Jaw: None, normal Tongue: None, normal,Extremity Movements Upper (arms, wrists, hands, fingers): None, normal Lower (legs, knees, ankles, toes): None, normal, Trunk Movements Neck, shoulders, hips: None, normal, Overall Severity Severity of abnormal movements (highest score from questions above): None, normal Incapacitation due to abnormal movements: None, normal Patient's awareness of abnormal movements (rate only patient's report): No Awareness, Dental Status Current problems with teeth and/or dentures?: No Does patient usually wear dentures?: No  CIWA:  CIWA-Ar Total: 8 COWS:  COWS Total Score: 3  Musculoskeletal: Strength & Muscle Tone: within normal limits Gait & Station: normal Patient leans: N/A  Psychiatric Specialty Exam: Physical Exam  Nursing note and vitals reviewed. Constitutional: She is oriented to person, place, and time.  HENT:  Head: Normocephalic.  Nose: Nose normal.  Mouth/Throat: Oropharynx is clear.  Eyes: Pupils are equal, round, and reactive to light.  Cardiovascular: Normal rate.  Respiratory: Effort normal.  Genitourinary:    Genitourinary Comments: Deferred   Musculoskeletal:        General: Normal range of motion.     Cervical back: Normal range of motion.  Neurological: She is alert and oriented to person, place, and time.  Skin: Skin is warm and dry.    Review of Systems  Constitutional: Negative for chills, diaphoresis and fever.  HENT: Negative for congestion, sneezing and sore throat.   Eyes: Negative for discharge.  Respiratory: Negative for chest tightness and shortness of breath.   Cardiovascular: Negative for chest pain and palpitations.  Gastrointestinal: Negative for diarrhea, nausea and vomiting.  Endocrine: Negative for cold intolerance.  Genitourinary: Negative for difficulty urinating.  Musculoskeletal: Negative for arthralgias.   Skin: Negative for color change.  Allergic/Immunologic: Negative for environmental allergies and food allergies.       Allergies: NKDA  Neurological: Negative for dizziness, tremors, seizures, syncope, speech difficulty, light-headedness and headaches.  Psychiatric/Behavioral: Positive for dysphoric mood (Stabilized with medication prior to discharge) and sleep disturbance (Stabilized with medication prior to discharge). Negative for agitation, behavioral problems, confusion, decreased concentration, hallucinations, self-injury and suicidal ideas. The patient is not nervous/anxious (Stable) and is not hyperactive.     Blood pressure 120/83, pulse 97, temperature 99.4 F (  37.4 C), temperature source Oral, resp. rate 20, height 5\' 6"  (1.676 m), weight 78 kg, last menstrual period 01/21/2020, SpO2 99 %.Body mass index is 27.76 kg/m.  See Md's discharge SRA  Sleep:  Number of Hours: 4.75   Have you used any form of tobacco in the last 30 days? (Cigarettes, Smokeless Tobacco, Cigars, and/or Pipes): Yes  Has this patient used any form of tobacco in the last 30 days? (Cigarettes, Smokeless Tobacco, Cigars, and/or Pipes): Yes, an FDA-approved tobacco cessation medication was recommended at discharge.  Blood Alcohol level:  Lab Results  Component Value Date   ETH 67 (H) 01/29/2020   ETH 75 (H) 06/21/2013    Metabolic Disorder Labs:  Lab Results  Component Value Date   HGBA1C 5.2 01/30/2020   MPG 103 01/30/2020   No results found for: PROLACTIN Lab Results  Component Value Date   CHOL 198 01/30/2020   TRIG 394 (H) 01/30/2020   HDL 37 (L) 01/30/2020   CHOLHDL 5.4 01/30/2020   VLDL 79 (H) 01/30/2020   LDLCALC 82 01/30/2020   LDLCALC 118 (H) 11/13/2014   See Psychiatric Specialty Exam and Suicide Risk Assessment completed by Attending Physician prior to discharge.  Discharge destination:  Home  Is patient on multiple antipsychotic therapies at discharge:  No   Has Patient had three  or more failed trials of antipsychotic monotherapy by history:  No  Recommended Plan for Multiple Antipsychotic Therapies: NA  Allergies as of 02/01/2020   No Known Allergies     Medication List    STOP taking these medications   ALPRAZolam 1 MG tablet Commonly known as: XANAX   gi cocktail Susp suspension   omeprazole 40 MG capsule Commonly known as: PRILOSEC   ondansetron 4 MG disintegrating tablet Commonly known as: ZOFRAN-ODT   sucralfate 1 g tablet Commonly known as: Carafate     TAKE these medications     Indication  gabapentin 100 MG capsule Commonly known as: NEURONTIN Take 2 capsules (200 mg total) by mouth 3 (three) times daily. For agitation  Indication: Agitation   hydrOXYzine 25 MG tablet Commonly known as: ATARAX/VISTARIL Take 1 tablet (25 mg total) by mouth every 6 (six) hours as needed for anxiety.  Indication: Feeling Anxious   lamoTRIgine 25 MG tablet Commonly known as: LAMICTAL Take 2 tablets (50 mg total) by mouth daily. For mood stabilization Start taking on: February 02, 2020  Indication: Mood stabilization   nicotine 21 mg/24hr patch Commonly known as: NICODERM CQ - dosed in mg/24 hours Place 1 patch (21 mg total) onto the skin daily. (May buy from over the counter): For smoking cessation Start taking on: February 02, 2020  Indication: Nicotine Addiction       Follow-up Information    Milagros EvenerKaur, Rupinder, MD. Call on 02/06/2020.   Specialty: Psychiatry Why: You are scheduled for an appointment on 02/06/20 at 10:45 am for medication management.  This will be a Virtual appointment.  PLEASE CALL THE PROVIDER UPON DISCHARGE to obtain necessary instructions to keep this appointment. Contact information: [address] [place] [address] [phone]              Follow-up recommendations: Activity:  As tolerated Diet: As recommended by your primary care doctor. Keep all scheduled follow-up appointments as recommended.    Comments: Prescriptions given at discharge.  Patient agreeable to plan.  Given opportunity to ask questions.  Appears to feel comfortable with discharge denies any current suicidal or homicidal thought. Patient  is also instructed prior to discharge to: Take all medications as prescribed by his/her mental healthcare provider. Report any adverse effects and or reactions from the medicines to his/her outpatient provider promptly. Patient has been instructed & cautioned: To not engage in alcohol and or illegal drug use while on prescription medicines. In the event of worsening symptoms, patient is instructed to call the crisis hotline, 911 and or go to the nearest ED for appropriate evaluation and treatment of symptoms. To follow-up with his/her primary care provider for your other medical issues, concerns and or health care needs.  Signed: Lindell Spar, NP, PMHNP, FNP-BC 02/01/2020, 9:52 AM

## 2020-02-01 NOTE — Progress Notes (Signed)
   01/31/20 1945  Psych Admission Type (Psych Patients Only)  Admission Status Involuntary  Psychosocial Assessment  Patient Complaints Depression;Sleep disturbance;Insomnia  Eye Contact Fair  Facial Expression Sad;Anxious  Affect Anxious;Sad;Irritable;Depressed  Speech Logical/coherent  Interaction Assertive  Motor Activity Other (Comment) (WNL)  Appearance/Hygiene Unremarkable  Behavior Characteristics Cooperative;Appropriate to situation;Irritable;Anxious  Mood Depressed;Anxious;Pleasant  Thought Process  Coherency WDL  Content WDL  Delusions None reported or observed  Perception WDL  Hallucination None reported or observed  Judgment Impaired  Confusion None  Danger to Self  Current suicidal ideation? Denies  Danger to Others  Danger to Others None reported or observed

## 2020-02-01 NOTE — Progress Notes (Signed)
Discharge Note:  Patient discharged home with [employer].  Patient denied SI and HI.  Denied A/V hallucinations.  Suicide prevention information given and discussed with patient who stated she understood and had no questions.  Patient stated she received all her belongings, clothing, toiletries, mis items, etc.  Patient stated she appreciated all assistance received from [facility] staff.  All required discharge information given to patient at discharge.

## 2020-02-01 NOTE — Progress Notes (Signed)
D:  Patient's self inventory sheet, patient has poor sleep, no sleep medication.  Good appetite, normal energy level, good concentration.  Rated depression 3, denied hopeless, anxiety 7.  Denied withdrawals.  Denied SI.  Denied physical problems.  Physical pain, back and hand, pain medicine helpful.  Goal is discharge.  Discharge plans.   A:  Medications administered per MD orders.  Emotional support and encouragement given patient. R:  Denied SI and HI, contracts for safety.  Denied A/V hallucinations.  Safety maintained with 15 minute checks.

## 2020-02-01 NOTE — Progress Notes (Signed)
  [facility] Adult Case Management Discharge Plan :  Will you be returning to the same living situation after discharge:  Yes,  patient is returning home with her family At discharge, do you have transportation home?: Yes,  CSW arranged Safe Transport ([employer]ft) Do you have the ability to pay for your medications: No.  Release of information consent forms completed and in the chart;  Patient's signature needed at discharge.  Patient to Follow up at:  Follow-up Information    Milagros Evener, MD. Call on 02/06/2020.   Specialty: Psychiatry Why: You are scheduled for an appointment on 02/06/20 at 10:45 am for medication management.  This will be a Virtual appointment.  PLEASE CALL THE PROVIDER UPON DISCHARGE to obtain necessary instructions to keep this appointment. Contact information: [address] [place] [address] [phone]               Next level of care provider has access to [facility] Link:yes  Safety Planning and Suicide Prevention discussed: Yes,  with the patient  Have you used any form of tobacco in the last 30 days? (Cigarettes, Smokeless Tobacco, Cigars, and/or Pipes): Yes  Has patient been referred to the Quitline?: Patient refused referral  Patient has been referred for addiction treatment: Pt. refused referral  [name], LCSWA 02/01/2020, 11:39 AM

## 2020-02-01 NOTE — B[facility] Suicide Risk Assessment (Signed)
[facility] Discharge Suicide Risk Assessment   Principal Problem: Severe recurrent major depression without psychotic features [facility]) Discharge Diagnoses: Principal Problem:   Severe recurrent major depression without psychotic features (HCC)   Total Time spent with patient: 15 minutes  Musculoskeletal: Strength & Muscle Tone: within normal limits Gait & Station: normal Patient leans: N/A  Psychiatric Specialty Exam: Review of Systems  All other systems reviewed and are negative.   Blood pressure 120/83, pulse 97, temperature 99.4 F (37.4 C), temperature source Oral, resp. rate 20, height 5\' 6"  (1.676 m), weight 78 kg, last menstrual period 01/21/2020, SpO2 99 %.Body mass index is 27.76 kg/m.  General Appearance: Casual  Eye Contact::  Fair  Speech:  Normal Rate409  Volume:  Normal  Mood:  Anxious  Affect:  Congruent  Thought Process:  Coherent and Descriptions of Associations: Intact  Orientation:  Full (Time, Place, and Person)  Thought Content:  Logical  Suicidal Thoughts:  No  Homicidal Thoughts:  No  Memory:  Immediate;   Good Recent;   Good Remote;   Good  Judgement:  Intact  Insight:  Fair  Psychomotor Activity:  Normal  Concentration:  Fair  Recall:  Good  Fund of Knowledge:Good  Language: Good  Akathisia:  Negative  Handed:  Right  AIMS (if indicated):     Assets:  Desire for Improvement Resilience Talents/Skills  Sleep:  Number of Hours: 4.75  Cognition: WNL  ADL's:  Intact   Mental Status Per Nursing Assessment::   On Admission:  NA  Demographic Factors:  Low socioeconomic status  Loss Factors: Financial problems/change in socioeconomic status  Historical Factors: Impulsivity  Risk Reduction Factors:   Responsible for children under 56 years of age, Sense of responsibility to family, Employed and Living with another person, especially a relative  Continued Clinical Symptoms:  Depression:   Insomnia Alcohol/Substance  Abuse/Dependencies  Cognitive Features That Contribute To Risk:  None    Suicide Risk:  Minimal: No identifiable suicidal ideation.  Patients presenting with no risk factors but with morbid ruminations; may be classified as minimal risk based on the severity of the depressive symptoms   Follow-up Information    15, MD. Call on 02/06/2020.   Specialty: Psychiatry Why: You are scheduled for an appointment on 02/06/20 at 10:45 am for medication management.  This will be a Virtual appointment.  PLEASE CALL THE PROVIDER UPON DISCHARGE to obtain necessary instructions to keep this appointment. Contact information: [address] [place] [phone]               Plan Of Care/Follow-up recommendations:  Activity:  ad lib  [phone], MD 02/01/2020, 7:51 AM

## 2020-05-03 ENCOUNTER — Encounter (HOSPITAL_COMMUNITY): Payer: Self-pay

## 2020-05-03 ENCOUNTER — Other Ambulatory Visit: Payer: Self-pay

## 2020-05-03 ENCOUNTER — Emergency Department (HOSPITAL_COMMUNITY)
Admission: EM | Admit: 2020-05-03 | Discharge: 2020-05-03 | Disposition: A | Discharge status: Home / Self Care | Payer: Self-pay | Attending: Emergency Medicine | Admitting: Emergency Medicine

## 2020-05-03 DIAGNOSIS — Z23 Encounter for immunization: Secondary | ICD-10-CM | POA: Insufficient documentation

## 2020-05-03 DIAGNOSIS — W06XXXA Fall from bed, initial encounter: Secondary | ICD-10-CM | POA: Insufficient documentation

## 2020-05-03 DIAGNOSIS — F17210 Nicotine dependence, cigarettes, uncomplicated: Secondary | ICD-10-CM | POA: Insufficient documentation

## 2020-05-03 DIAGNOSIS — S0181XA Laceration without foreign body of other part of head, initial encounter: Secondary | ICD-10-CM | POA: Insufficient documentation

## 2020-05-03 DIAGNOSIS — S0990XA Unspecified injury of head, initial encounter: Secondary | ICD-10-CM | POA: Insufficient documentation

## 2020-05-03 DIAGNOSIS — R55 Syncope and collapse: Secondary | ICD-10-CM | POA: Insufficient documentation

## 2020-05-03 LAB — BASIC METABOLIC PANEL
Anion gap: 13 (ref 5–15)
BUN: 13 mg/dL (ref 6–20)
CO2: 28 mmol/L (ref 22–32)
Calcium: 9.4 mg/dL (ref 8.9–10.3)
Chloride: 98 mmol/L (ref 98–111)
Creatinine, Ser: 0.9 mg/dL (ref 0.44–1.00)
GFR calc Af Amer: >60 mL/min (ref >60)
GFR calc non Af Amer: >60 mL/min (ref >60)
Glucose, Bld: 113 mg/dL — ABNORMAL HIGH (ref 70–99)
Potassium: 3.8 mmol/L (ref 3.5–5.1)
Sodium: 139 mmol/L (ref 135–145)

## 2020-05-03 LAB — CBC
HCT: 45.9 % (ref 36.0–46.0)
Hemoglobin: 15.6 g/dL — ABNORMAL HIGH (ref 12.0–15.0)
MCH: 32.6 pg (ref 26.0–34.0)
MCHC: 34 g/dL (ref 30.0–36.0)
MCV: 95.8 fL (ref 80.0–100.0)
Platelets: 270 10*3/uL (ref 150–400)
RBC: 4.79 MIL/uL (ref 3.87–5.11)
RDW: 12.5 % (ref 11.5–15.5)
WBC: 9.3 10*3/uL (ref 4.0–10.5)
nRBC: 0 % (ref 0.0–0.2)

## 2020-05-03 LAB — I-STAT BETA HCG BLOOD, ED (MC, WL, AP ONLY): I-stat hCG, quantitative: <5 m[IU]/mL (ref <5)

## 2020-05-03 MED ORDER — IBUPROFEN 200 MG PO TABS
400.0000 mg | ORAL_TABLET | Freq: Once | ORAL | Status: AC
  Administered 2020-05-03: 400 mg via ORAL
  Filled 2020-05-03: qty 2

## 2020-05-03 MED ORDER — LIDOCAINE-EPINEPHRINE (PF) 2 %-1:200000 IJ SOLN
10.0000 mL | Freq: Once | INTRAMUSCULAR | Status: AC
  Administered 2020-05-03: 10 mL
  Filled 2020-05-03: qty 20

## 2020-05-03 MED ORDER — TETANUS-DIPHTH-ACELL PERTUSSIS 5-2.5-18.5 LF-MCG/0.5 IM SUSP
0.5000 mL | Freq: Once | INTRAMUSCULAR | Status: AC
  Administered 2020-05-03: 0.5 mL via INTRAMUSCULAR
  Filled 2020-05-03: qty 0.5

## 2020-05-03 MED ORDER — BACITRACIN ZINC 500 UNIT/GM EX OINT
TOPICAL_OINTMENT | Freq: Two times a day (BID) | CUTANEOUS | Status: DC
  Administered 2020-05-03: 1 via TOPICAL
  Filled 2020-05-03 (×2): qty 0.9

## 2020-05-03 NOTE — ED Triage Notes (Addendum)
Patient states she began feeling nauseated while lying in the bed last night, got up, felt dizzy and then  had LOC, and when she woke she was lying on the bathroom floor. patient has a laceration to the chin. Patient has a bandage on it from home. No bleeding. Patient c/o head aching on the right side and right shoulder pain that is worse with movement.

## 2020-05-03 NOTE — ED Provider Notes (Signed)
[facility]-EMERGENCY DEPT Provider Note   CSN: [id number] Arrival date & time: 05/03/20  1143     History Chief Complaint  Patient presents with  . chin laceration  . Loss of Consciousness  . Head Injury  . Shoulder Pain    [name] is a 39 y.o. female.  HPI She fell, last night after getting out of bed and feeling dizzy.  She fell forward striking her face.  She believes that she lost consciousness.  She bandaged the wound last night and decided to come in later today, because of concern about a laceration.  She denies headache that is persistent, nausea, vomiting, weakness or dizziness.  There has been no blurred vision.  She denies neck or back pain.  There are no other preceding problems.  There are no other known modifying factors.    Past Medical History:  Diagnosis Date  . Alcohol abuse   . History of cervical dysplasia    CIN I  in  2004  . Inflamed external hemorrhoid   . Inflamed internal hemorrhoid   . Major depression   . PTSD (post-traumatic stress disorder)   . Rectal pain   . Scoliosis 11/12/2012    Patient Active Problem List   Diagnosis Date Noted  . Severe recurrent major depression without psychotic features (HCC) 01/30/2020  . Intentional drug overdose (HCC)   . Alcoholic intoxication without complication (HCC)   . Cocaine abuse (HCC)   . Nausea without vomiting 06/15/2017  . Abdominal pain, epigastric 06/15/2017  . Abnormal CT scan 06/15/2017  . Right upper quadrant pain 06/15/2017  . Dysmenorrhea 02/24/2016  . Obesity 11/13/2014  . Edema 11/13/2014  . Polyarthralgia 11/13/2014  . Shoulder pain, left 07/16/2014  . PTSD (post-traumatic stress disorder) 06/23/2013  . Severe recurrent major depression (HCC) 06/23/2013  . Hemorrhoids 02/07/2013  . Rectal pain 02/07/2013  . Generalized anxiety disorder 11/15/2012  . Cannabis abuse 11/13/2012    Class: Chronic  . Alcohol abuse 11/13/2012  . Herpetic whitlow  04/06/2012  . TOBACCO USER 05/25/2009    Past Surgical History:  Procedure Laterality Date  . APPENDECTOMY  1994  . CERVICAL BIOPSY  W/ LOOP ELECTRODE EXCISION  06-11-2003  . EVALUATION UNDER ANESTHESIA WITH HEMORRHOIDECTOMY N/A 01/22/2014   Procedure: EXAM UNDER ANESTHESIA WITH HEMORRHOIDECTOMY;  Surgeon: Romie Levee, MD;  Location: [facility];  Service: General;  Laterality: N/A;  . TUBAL LIGATION  05-08-2005   W/  FILSHIE CLIPS     OB History   No obstetric history on file.     Family History  Problem Relation Age of Onset  . Cancer Father        prostate  . Cancer Sister        bladder  . Lung disease Mother   . Cancer Maternal Uncle        Lung  . Cancer Paternal Aunt        Breast  . Cancer Maternal Grandfather   . Cancer Paternal Grandfather   . Cancer Paternal Aunt        Breast    Social History   Tobacco Use  . Smoking status: Current Every Day Smoker    Packs/day: 0.50    Years: 15.00    Pack years: 7.50    Types: Cigarettes  . Smokeless tobacco: Never Used  Vaping Use  . Vaping Use: Never used  Substance Use Topics  . Alcohol use: Yes  . Drug use: Yes  Types: Marijuana    Comment: pt denies (there is documented hx cannibus use)    Home Medications Prior to Admission medications   Medication Sig Start Date End Date Taking? Authorizing Provider  gabapentin (NEURONTIN) 100 MG capsule Take 2 capsules (200 mg total) by mouth 3 (three) times daily. For agitation 02/01/20   Armandina StammerNwoko, Agnes I, NP  hydrOXYzine (ATARAX/VISTARIL) 25 MG tablet Take 1 tablet (25 mg total) by mouth every 6 (six) hours as needed for anxiety. 02/01/20   Armandina StammerNwoko, Agnes I, NP  lamoTRIgine (LAMICTAL) 25 MG tablet Take 2 tablets (50 mg total) by mouth daily. For mood stabilization 02/02/20   Nwoko, Nicole KindredAgnes I, NP  nicotine (NICODERM CQ - DOSED IN MG/24 HOURS) 21 mg/24hr patch Place 1 patch (21 mg total) onto the skin daily. (May buy from over the counter): For smoking  cessation 02/02/20   Sanjuana KavaNwoko, Agnes I, NP    Allergies    Patient has no known allergies.  Review of Systems   Review of Systems  All other systems reviewed and are negative.   Physical Exam Updated Vital Signs BP (!) 113/95 (BP Location: Left Arm)   Pulse (!) 107   Temp 98.6 F (37 C) (Oral)   Resp 14   Ht 5\' 6"  (1.676 m)   Wt 74.8 kg   LMP 04/16/2020   SpO2 94%   BMI 26.63 kg/m   Physical Exam Vitals and nursing note reviewed.  Constitutional:      General: She is not in acute distress.    Appearance: She is well-developed. She is not ill-appearing, toxic-appearing or diaphoretic.  HENT:     Head: Normocephalic.     Comments: Contusion, right parietal without associated abrasion or laceration.    Right Ear: External ear normal.     Left Ear: External ear normal.     Mouth/Throat:     Mouth: Mucous membranes are moist.     Comments: No trismus.  Laceration of the submental chin.  The wound is gaping and bleeding somewhat.  There is mild associated swelling and tenderness.  No visible dental injury. Eyes:     Conjunctiva/sclera: Conjunctivae normal.     Pupils: Pupils are equal, round, and reactive to light.  Neck:     Trachea: Phonation normal.  Cardiovascular:     Rate and Rhythm: Normal rate and regular rhythm.     Heart sounds: Normal heart sounds.  Pulmonary:     Effort: Pulmonary effort is normal.     Breath sounds: Normal breath sounds.  Abdominal:     Palpations: Abdomen is soft.     Tenderness: There is no abdominal tenderness.  Musculoskeletal:        General: Normal range of motion.     Cervical back: Normal range of motion and neck supple.     Comments: Normal range of motion neck, back, arms and legs.  Skin:    General: Skin is warm and dry.  Neurological:     Mental Status: She is alert and oriented to person, place, and time.     Cranial Nerves: No cranial nerve deficit.     Sensory: No sensory deficit.     Motor: No abnormal muscle tone.      Coordination: Coordination normal.  Psychiatric:        Mood and Affect: Mood normal.        Behavior: Behavior normal.        Thought Content: Thought content normal.  Judgment: Judgment normal.     ED Results / Procedures / Treatments   Labs (all labs ordered are listed, but only abnormal results are displayed) Labs Reviewed  BASIC METABOLIC PANEL - Abnormal; Notable for the following components:      Result Value   Glucose, Bld 113 (*)    All other components within normal limits  CBC - Abnormal; Notable for the following components:   Hemoglobin 15.6 (*)    All other components within normal limits  URINALYSIS, ROUTINE W REFLEX MICROSCOPIC  ETHANOL  I-STAT BETA HCG BLOOD, ED ([facility], [facility], [facility] ONLY)  CBG MONITORING, ED    EKG None     Date: 05/03/2020  Rate: 95  Rhythm: normal sinus rhythm  QRS Axis: right  PR and QT Intervals: normal  ST/T Wave abnormalities: normal  PR and QRS Conduction Disutrbances:none      Radiology No results found.  Procedures .Marland KitchenLaceration Repair  Date/Time: 05/03/2020 3:22 PM Performed by: Mancel Bale, MD Authorized by: Mancel Bale, MD   Consent:    Consent obtained:  Verbal   Consent given by:  Patient   Risks discussed:  Infection, pain, poor cosmetic result, poor wound healing and need for additional repair   Alternatives discussed:  No treatment Anesthesia (see MAR for exact dosages):    Anesthesia method:  Local infiltration   Local anesthetic:  Lidocaine 2% WITH epi Laceration details:    Location:  Face   Face location:  Chin   Length (cm):  3.5   Depth (mm):  9 Repair type:    Repair type:  Simple Exploration:    Hemostasis achieved with:  Direct pressure   Wound exploration: wound explored through full range of motion and entire depth of wound probed and visualized     Wound extent: no areolar tissue violation noted, no fascia violation noted, no foreign bodies/material noted, no muscle damage noted, no  nerve damage noted, no tendon damage noted, no underlying fracture noted and no vascular damage noted     Contaminated: no   Treatment:    Area cleansed with:  Betadine   Amount of cleaning:  Standard   Irrigation solution:  Sterile saline   Visualized foreign bodies/material removed: no   Skin repair:    Repair method:  Sutures   Suture size:  4-0   Suture material:  Prolene   Suture technique:  Simple interrupted Approximation:    Approximation:  Loose Post-procedure details:    Dressing:  Antibiotic ointment   Patient tolerance of procedure:  Tolerated well, no immediate complications   (including critical care time)  Medications Ordered in ED Medications  bacitracin ointment (1 application Topical Given 05/03/20 1528)  ibuprofen (ADVIL) tablet 400 mg (has no administration in time range)  lidocaine-EPINEPHrine (XYLOCAINE W/EPI) 2 %-1:200000 (PF) injection 10 mL (10 mLs Infiltration Given by Other 05/03/20 1503)  Tdap (BOOSTRIX) injection 0.5 mL (0.5 mLs Intramuscular Given 05/03/20 1527)    ED Course  I have reviewed the triage vital signs and the nursing notes.  Pertinent labs & imaging results that were available during my care of the patient were reviewed by me and considered in my medical decision making (see chart for details).    MDM Rules/Calculators/A&P                           Patient Vitals for the past 24 hrs:  BP Temp Temp src Pulse Resp SpO2 Height Weight  05/03/20 1158 -- -- -- -- -- -- 5\' 6"  (1.676 m) 74.8 kg  05/03/20 1155 (!) 113/95 98.6 F (37 C) Oral (!) 107 14 94 % -- --    3:32 PM Reevaluation with update and discussion. After initial assessment and treatment, an updated evaluation reveals she is comfortable after suture closure.  Findings discussed and questions answered. 05/05/20   Medical Decision Making:  This patient is presenting for evaluation of injury to the chin following syncope after arising from bed last night., which does  require a range of treatment options, and is a complaint that involves a moderate risk of morbidity and mortality. The differential diagnoses include facial fracture, laceration, head injury, spine injury. I decided to review old records, and in summary patient with history of heavy alcohol use, fell with reported syncope while alone, last night.  She complains of an injury to her chin with a laceration.  She bumped her head but does not feel bad from that at this point.  She does not have localizing signs for intracranial injury.  I I did not require additional historical information from anyone.  Clinical Laboratory Tests Ordered, included CBC, Metabolic panel and Pregnancy test. Review indicates normal findings.  Critical Interventions-clinical evaluation, laboratory testing, laceration repair, observation reassessment  After These Interventions, the Patient was reevaluated and was found stable for discharge.  CRITICAL CARE- No Performed by: Mancel Bale  Nursing Notes Reviewed/ Care Coordinated Applicable Imaging Reviewed Interpretation of Laboratory Data incorporated into ED treatment  The patient appears reasonably screened and/or stabilized for discharge and I doubt any other medical condition or other [facility] requiring further screening, evaluation, or treatment in the ED at this time prior to discharge.  Plan: Home Medications-continue usual medicines and use ibuprofen for pain and antibiotic ointment on the wound; Home Treatments-clean the wound twice a day; return here if the recommended treatment, does not improve the symptoms; Recommended follow up-see your primary care doctor or go to an urgent care for suture removal in 7 days     Final Clinical Impression(s) / ED Diagnoses Final diagnoses:  Syncope and collapse  Injury of head, initial encounter  Chin laceration, initial encounter    Rx / DC Orders ED Discharge Orders    None       [facility], MD 05/03/20  [phone]

## 2020-05-03 NOTE — ED Notes (Signed)
Save blue in main lab 

## 2020-05-03 NOTE — Discharge Instructions (Signed)
There were no serious injuries, after he fainted earlier this morning.  Your laceration was sewed, the stitches need to be removed in about 1 week.  In the meantime keep the wound clean by washing daily with soap and water.  You can apply an antibiotic ointment such as Neosporin and use a Band-Aid if desired.  If you have problems with head injury including dizziness, weakness, severe headache, return for reevaluation.  Use ibuprofen, if needed for pain.  We gave you a tetanus booster since you needed 1.

## 2020-05-03 NOTE — ED Notes (Signed)
Patient unable to void at this time. Pt states she just voided in the lobby.

## 2022-11-19 ENCOUNTER — Encounter (HOSPITAL_COMMUNITY): Payer: Self-pay | Admitting: Psychiatry

## 2022-11-19 ENCOUNTER — Other Ambulatory Visit (HOSPITAL_COMMUNITY)
Admission: EM | Admit: 2022-11-19 | Discharge: 2022-11-21 | Disposition: A | Discharge status: Home / Self Care | Payer: No Typology Code available for payment source | Attending: Psychiatry | Admitting: Psychiatry

## 2022-11-19 DIAGNOSIS — F17200 Nicotine dependence, unspecified, uncomplicated: Secondary | ICD-10-CM | POA: Diagnosis not present

## 2022-11-19 DIAGNOSIS — Z1152 Encounter for screening for COVID-19: Secondary | ICD-10-CM | POA: Insufficient documentation

## 2022-11-19 DIAGNOSIS — F1910 Other psychoactive substance abuse, uncomplicated: Secondary | ICD-10-CM | POA: Insufficient documentation

## 2022-11-19 LAB — LIPID PANEL
Cholesterol: 186 mg/dL (ref 0–200)
HDL: 45 mg/dL (ref >40)
LDL Cholesterol: 106 mg/dL — ABNORMAL HIGH (ref 0–99)
Total CHOL/HDL Ratio: 4.1 RATIO
Triglycerides: 176 mg/dL — ABNORMAL HIGH (ref <150)
VLDL: 35 mg/dL (ref 0–40)

## 2022-11-19 LAB — CBC WITH DIFFERENTIAL/PLATELET
Abs Immature Granulocytes: 0.02 10*3/uL (ref 0.00–0.07)
Basophils Absolute: 0 10*3/uL (ref 0.0–0.1)
Basophils Relative: 1 %
Eosinophils Absolute: 0.3 10*3/uL (ref 0.0–0.5)
Eosinophils Relative: 3 %
HCT: 40.4 % (ref 36.0–46.0)
Hemoglobin: 14 g/dL (ref 12.0–15.0)
Immature Granulocytes: 0 %
Lymphocytes Relative: 33 %
Lymphs Abs: 2.8 10*3/uL (ref 0.7–4.0)
MCH: 32.9 pg (ref 26.0–34.0)
MCHC: 34.7 g/dL (ref 30.0–36.0)
MCV: 94.8 fL (ref 80.0–100.0)
Monocytes Absolute: 0.6 10*3/uL (ref 0.1–1.0)
Monocytes Relative: 7 %
Neutro Abs: 4.9 10*3/uL (ref 1.7–7.7)
Neutrophils Relative %: 56 %
Platelets: 271 10*3/uL (ref 150–400)
RBC: 4.26 MIL/uL (ref 3.87–5.11)
RDW: 12.3 % (ref 11.5–15.5)
WBC: 8.5 10*3/uL (ref 4.0–10.5)
nRBC: 0 % (ref 0.0–0.2)

## 2022-11-19 LAB — POCT URINE DRUG SCREEN - MANUAL ENTRY (I-SCREEN)
POC Amphetamine UR: POSITIVE — AB
POC Buprenorphine (BUP): NOT DETECTED
POC Cocaine UR: POSITIVE — AB
POC Marijuana UR: POSITIVE — AB
POC Methadone UR: NOT DETECTED
POC Methamphetamine UR: POSITIVE — AB
POC Morphine: NOT DETECTED
POC Oxazepam (BZO): NOT DETECTED
POC Oxycodone UR: NOT DETECTED
POC Secobarbital (BAR): NOT DETECTED

## 2022-11-19 LAB — COMPREHENSIVE METABOLIC PANEL
ALT: 21 U/L (ref 0–44)
AST: 22 U/L (ref 15–41)
Albumin: 3.7 g/dL (ref 3.5–5.0)
Alkaline Phosphatase: 66 U/L (ref 38–126)
Anion gap: 12 (ref 5–15)
BUN: 12 mg/dL (ref 6–20)
CO2: 22 mmol/L (ref 22–32)
Calcium: 8.6 mg/dL — ABNORMAL LOW (ref 8.9–10.3)
Chloride: 99 mmol/L (ref 98–111)
Creatinine, Ser: 0.8 mg/dL (ref 0.44–1.00)
GFR, Estimated: >60 mL/min (ref >60)
Glucose, Bld: 100 mg/dL — ABNORMAL HIGH (ref 70–99)
Potassium: 3.6 mmol/L (ref 3.5–5.1)
Sodium: 133 mmol/L — ABNORMAL LOW (ref 135–145)
Total Bilirubin: 0.4 mg/dL (ref 0.3–1.2)
Total Protein: 6.4 g/dL — ABNORMAL LOW (ref 6.5–8.1)

## 2022-11-19 LAB — TSH: TSH: 1.827 u[IU]/mL (ref 0.350–4.500)

## 2022-11-19 LAB — POCT PREGNANCY, URINE: Preg Test, Ur: NEGATIVE

## 2022-11-19 LAB — POC SARS CORONAVIRUS 2 AG: SARSCOV2ONAVIRUS 2 AG: NEGATIVE

## 2022-11-19 LAB — SARS CORONAVIRUS 2 BY RT PCR: SARS Coronavirus 2 by RT PCR: NEGATIVE

## 2022-11-19 LAB — ETHANOL: Alcohol, Ethyl (B): 42 mg/dL — ABNORMAL HIGH (ref <10)

## 2022-11-19 MED ORDER — TRAZODONE HCL 50 MG PO TABS
50.0000 mg | ORAL_TABLET | Freq: Every evening | ORAL | Status: DC | PRN
  Administered 2022-11-19 – 2022-11-20 (×2): 50 mg via ORAL
  Filled 2022-11-19 (×2): qty 1

## 2022-11-19 MED ORDER — ADULT MULTIVITAMIN W/MINERALS CH
1.0000 | ORAL_TABLET | Freq: Every day | ORAL | Status: DC
  Administered 2022-11-19 – 2022-11-21 (×3): 1 via ORAL
  Filled 2022-11-19 (×3): qty 1

## 2022-11-19 MED ORDER — NICOTINE 21 MG/24HR TD PT24
21.0000 mg | MEDICATED_PATCH | Freq: Every day | TRANSDERMAL | Status: DC
  Administered 2022-11-19 – 2022-11-21 (×3): 21 mg via TRANSDERMAL
  Filled 2022-11-19 (×3): qty 1

## 2022-11-19 MED ORDER — ALUM & MAG HYDROXIDE-SIMETH 200-200-20 MG/5ML PO SUSP: 30.0000 mL | ORAL | Status: DC | PRN

## 2022-11-19 MED ORDER — THIAMINE MONONITRATE 100 MG PO TABS
100.0000 mg | ORAL_TABLET | Freq: Every day | ORAL | Status: DC
  Administered 2022-11-20 – 2022-11-21 (×2): 100 mg via ORAL
  Filled 2022-11-19 (×2): qty 1

## 2022-11-19 MED ORDER — LORAZEPAM 1 MG PO TABS
1.0000 mg | ORAL_TABLET | Freq: Four times a day (QID) | ORAL | Status: DC | PRN
  Administered 2022-11-19: 1 mg via ORAL
  Filled 2022-11-19: qty 1

## 2022-11-19 MED ORDER — ACETAMINOPHEN 325 MG PO TABS
650.0000 mg | ORAL_TABLET | Freq: Four times a day (QID) | ORAL | Status: DC | PRN
  Administered 2022-11-19: 650 mg via ORAL
  Filled 2022-11-19: qty 2

## 2022-11-19 MED ORDER — HYDROXYZINE HCL 25 MG PO TABS
25.0000 mg | ORAL_TABLET | Freq: Four times a day (QID) | ORAL | Status: DC | PRN
  Administered 2022-11-19 – 2022-11-21 (×4): 25 mg via ORAL
  Filled 2022-11-19 (×4): qty 1

## 2022-11-19 MED ORDER — IBUPROFEN 600 MG PO TABS
600.0000 mg | ORAL_TABLET | Freq: Three times a day (TID) | ORAL | Status: DC | PRN
  Administered 2022-11-19 – 2022-11-21 (×4): 600 mg via ORAL
  Filled 2022-11-19 (×4): qty 1

## 2022-11-19 MED ORDER — ONDANSETRON 4 MG PO TBDP: 4.0000 mg | ORAL_TABLET | Freq: Four times a day (QID) | ORAL | Status: DC | PRN

## 2022-11-19 MED ORDER — THIAMINE HCL 100 MG/ML IJ SOLN
100.0000 mg | Freq: Once | INTRAMUSCULAR | Status: AC
  Administered 2022-11-19: 100 mg via INTRAMUSCULAR
  Filled 2022-11-19: qty 2

## 2022-11-19 MED ORDER — MAGNESIUM HYDROXIDE 400 MG/5ML PO SUSP: 30.0000 mL | Freq: Every day | ORAL | Status: DC | PRN

## 2022-11-19 MED ORDER — LOPERAMIDE HCL 2 MG PO CAPS: 2.0000 mg | ORAL_CAPSULE | ORAL | Status: DC | PRN

## 2022-11-19 NOTE — ED Notes (Signed)
Patient given a sandwich and juice

## 2022-11-19 NOTE — ED Notes (Signed)
Pt calm and cooperative. No c/o pain or distress. Alert and orient x 4. Will continue to monitor for safety 

## 2022-11-19 NOTE — Progress Notes (Signed)
   11/19/22 1404  [place] Triage Screening (Walk-ins at [facility] only)  How Did You Hear About [place]? Self  What Is the Reason for Your Visit/Call Today? detox and rehab treatment  How Long Has This Been Causing You Problems? 1-6 months  Have You Recently Had Any Thoughts About Hurting Yourself? Yes  How long ago did you have thoughts about hurting yourself? passive SI  Are You Planning to Commit Suicide/Harm Yourself At This time? No  Have you Recently Had Thoughts About [place]? No  Are You Planning To Harm Someone At This Time? No  Are you currently experiencing any auditory, visual or other hallucinations? No  Have You Used Any Alcohol or Drugs in the Past 24 Hours? Yes  How long ago did you use Drugs or Alcohol? today  What Did You Use and How Much? THC, cocaine and ETOH  Do you have any current medical co-morbidities that require immediate attention? No  Clinician description of patient physical appearance/behavior: tearful  What Do You Feel Would Help You the Most Today? Alcohol or Drug Use Treatment;Treatment for Depression or other mood problem  If access to [facility] was not available, would you have sought care in the Emergency Department? No  Determination of Need Urgent (48 hours)  Options For Referral Facility-Based Crisis
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
## 2022-11-19 NOTE — ED Notes (Signed)
Pt arrived to Observation unit.

## 2022-11-19 NOTE — ED Notes (Signed)
Pt sleeping at present, no distress noted.  Monitoring for safety. 

## 2022-11-19 NOTE — ED Provider Notes (Signed)
[facility] Continuous Assessment Admission H&P  Date: 11/19/22 Patient Name: [name] MRN: [id number] Chief Complaint: "I'm smoking crack. I'm drinking. I did meth."  Diagnoses:  Final diagnoses:  Polysubstance abuse   HPI: Pt presents voluntarily to [facility] behavioral health for walk-in assessment.  Pt is accompanied by her husband. Pt prefers to be assessed alone. Pt is assessed face-to-face by nurse practitioner.   [name], 42 y.o., female patient seen face to face by this provider; and chart reviewed on 11/19/22.   On evaluation, when asked reason for presenting today, [name] reports "I'm smoking crack. I'm drinking. I did meth". Pt reports 4 day binge on substances. She reports she has been using alcohol daily for months. When asked about amount of use, reports "as much as I can", estimates about 8 alcoholic drinks/day. She reports last alcohol use was prior to arrival at facility. She reports she had 4 alcoholic drinks today. Pt endorses use of crack for the past 4 days. Reports last use of crack was around 9AM today. Pt endorses use of meth. Reports she does not use meth often. She last used meth around 9AM today. Pt endorses use of marijuana. Reports last use of marijuana was around 9AM today. Pt reports use of cigarettes. Pt denies history of withdrawal seizures of delirium tremens.  Pt endorses passive suicidal ideations. States "I don't give a shit. I have nothing to live for." Also voices "I want to live. I want my life back." Pt reports she cannot stop using substances on her own. She denies homicidal or violent ideations. She denies auditory visual hallucinations or paranoia.  She endorses history of non suicidal self injurious behavior, cutting, last occurring "couple of weeks ago". She reports history of suicide attempts, last "years ago", when she overdosed on "50 xanax and drank 1/5 of liquor". Pt endorses prior inpatient psychiatric  hospitalizations, last occurring "years ago".  Pt denies medical history. Per chart review, history of scoliosis, cervical dysplasia, rectal pain.  Pt denies she is taking daily medications. She reports she was previously prescribed lamictal and xanax "but couldn't afford them anymore". States it has been months since she last took medications. Per review of PDMP, 120 alprazolam 1mg  tablets were last written/filled on 06/09/22.  Pt reports highest level of education is high school diploma.  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 Reports she "lost job" due to substance use.  Pt denies access to a firearm or other weapon.  Pt endorses pending legal charges, 4/29 stealing case of beer; 5/15 assault with a deadly weapon; 5/23 DUI.  Pt is interested in substance use detox and assistance in getting connected with residential substance use treatment. Discussed with pt admission to continuous assessment with possible [facility] admission tomorrow. Pt agrees with plan.  Total Time spent with patient: 30 minutes  Musculoskeletal  Strength & Muscle Tone: within normal limits Gait & Station: unsteady Patient leans: N/A  Psychiatric Specialty Exam  Presentation General Appearance:  Appropriate for Environment; Disheveled  Eye Contact: Fair  Speech: Clear and Coherent; Normal Rate  Speech Volume: Increased  Handedness: Right   Mood and Affect  Mood: Anxious; Depressed; Dysphoric  Affect: Labile; Tearful   Thought Process  Thought Processes: Coherent; Goal Directed; Linear  Descriptions of Associations:Intact  Orientation:Full (Time, Place and Person)  Thought Content:Logical    Hallucinations:Hallucinations: None  Ideas of Reference:None  Suicidal Thoughts:Suicidal Thoughts: Yes, Passive  Homicidal Thoughts:Homicidal Thoughts: No   Sensorium  Memory:  Immediate Good  Judgment: Impaired  Insight: Present   Executive Functions   Concentration: Fair  Attention Span: Fair  Recall: [employer] of Knowledge: Fair  Language: Fair   Psychomotor Activity  Psychomotor Activity: Psychomotor Activity: Normal   Assets  Assets: Armed forces logistics/support/administrative officer; Desire for Improvement; Financial Resources/Insurance; Housing; Intimacy; Resilience   Sleep  Sleep: Sleep: Poor   Nutritional Assessment (For OBS and [facility] admissions only) Has the patient had a weight loss or gain of 10 pounds or more in the last 3 months?: No Has the patient had a decrease in food intake/or appetite?: No Does the patient have dental problems?: No Does the patient have eating habits or behaviors that may be indicators of an eating disorder including binging or inducing vomiting?: No Has the patient recently lost weight without trying?: 0 Has the patient been eating poorly because of a decreased appetite?: 0 Malnutrition Screening Tool Score: 0    Physical Exam Constitutional:      General: She is not in acute distress.    Appearance: She is not ill-appearing, toxic-appearing or diaphoretic.  Eyes:     General: No scleral icterus. Cardiovascular:     Rate and Rhythm: Normal rate.  Pulmonary:     Effort: Pulmonary effort is normal. No respiratory distress.  Neurological:     Mental Status: She is alert and oriented to person, place, and time.  Psychiatric:        Attention and Perception: Attention and perception normal.        Mood and Affect: Mood is anxious and depressed. Affect is labile and tearful.        Speech: Speech normal.        Behavior: Behavior normal. Behavior is cooperative.        Thought Content: Thought content includes suicidal ideation. Thought content does not include suicidal plan.        Cognition and Memory: Cognition and memory normal.        Judgment: Judgment is impulsive.    Review of Systems  Constitutional:  Positive for malaise/fatigue. Negative for chills and fever.  Respiratory:  Negative for  shortness of breath.   Cardiovascular:  Negative for chest pain and palpitations.  Gastrointestinal:  Positive for nausea and vomiting.  Psychiatric/Behavioral:  Positive for depression, substance abuse and suicidal ideas. The patient is nervous/anxious.     Blood pressure (!) 152/98, pulse 97, temperature 97.7 F (36.5 C), temperature source Oral, resp. rate 18, SpO2 100 %. There is no height or weight on file to calculate BMI.  Past Psychiatric History: Alcohol abuse, major depression, PTSD   Is the patient at risk to self? Yes  Has the patient been a risk to self in the past 6 months? Yes .    Has the patient been a risk to self within the distant past? Yes   Is the patient a risk to others? Yes   Has the patient been a risk to others in the past 6 months? Yes   Has the patient been a risk to others within the distant past? Yes   Past Medical History: Per chart review, history of scoliosis, cervical dysplasia, rectal pain  Family History: None reported  Social History: Living with 53 y/o son and husband, pending legal charges (4/29 stealing case of beer; 5/15 assault with a deadly weapon; 5/23 DUI)  Last Labs:  Admission on 11/19/2022  Component Date Value Ref Range Status   POC Amphetamine UR 11/19/2022 Positive (A)  NONE DETECTED (Cut Off Level 1000 ng/mL) Final   POC Secobarbital (BAR) 11/19/2022 None Detected  NONE DETECTED (Cut Off Level 300 ng/mL) Final   POC Buprenorphine (BUP) 11/19/2022 None Detected  NONE DETECTED (Cut Off Level 10 ng/mL) Final   POC Oxazepam (BZO) 11/19/2022 None Detected  NONE DETECTED (Cut Off Level 300 ng/mL) Final   POC Cocaine UR 11/19/2022 Positive (A)  NONE DETECTED (Cut Off Level 300 ng/mL) Final   POC Methamphetamine UR 11/19/2022 Positive (A)  NONE DETECTED (Cut Off Level 1000 ng/mL) Final   POC Morphine 11/19/2022 None Detected  NONE DETECTED (Cut Off Level 300 ng/mL) Final   POC Methadone UR 11/19/2022 None Detected  NONE DETECTED (Cut Off  Level 300 ng/mL) Final   POC Oxycodone UR 11/19/2022 None Detected  NONE DETECTED (Cut Off Level 100 ng/mL) Final   POC Marijuana UR 11/19/2022 Positive (A)  NONE DETECTED (Cut Off Level 50 ng/mL) Final   SARSCOV2ONAVIRUS 2 AG 11/19/2022 NEGATIVE  NEGATIVE Final   Comment: (NOTE) SARS-CoV-2 antigen NOT DETECTED.   Negative results are presumptive.  Negative results do not preclude SARS-CoV-2 infection and should not be used as the sole basis for treatment or other patient management decisions, including infection  control decisions, particularly in the presence of clinical signs and  symptoms consistent with COVID-19, or in those who have been in contact with the virus.  Negative results must be combined with clinical observations, patient history, and epidemiological information. The expected result is Negative.  Fact Sheet for Patients: HandmadeRecipes.com.cy  Fact Sheet for Healthcare Providers: FuneralLife.at  This test is not yet approved or cleared by the [place] FDA and  has been authorized for detection and/or diagnosis of SARS-CoV-2 by FDA under an Emergency Use Authorization (EUA).  This EUA will remain in effect (meaning this test can be used) for the duration of  the COV                          ID-19 declaration under Section 564(b)(1) of the Act, 21 U.S.C. section 360bbb-3(b)(1), unless the authorization is terminated or revoked sooner.     Preg Test, Ur 11/19/2022 NEGATIVE  NEGATIVE Final   Comment:        THE SENSITIVITY OF THIS METHODOLOGY IS >24 mIU/mL    Allergies: Patient has no known allergies.  Medications:  Facility Ordered Medications  Medication   naproxen (NAPROSYN) tablet 375 mg   acetaminophen (TYLENOL) tablet 650 mg   alum & mag hydroxide-simeth (MAALOX/MYLANTA) 200-200-20 MG/5ML suspension 30 mL   magnesium hydroxide (MILK OF MAGNESIA) suspension 30 mL   traZODone (DESYREL) tablet 50 mg    [START ON 11/20/2022] nicotine (NICODERM CQ - dosed in mg/24 hours) patch 21 mg   thiamine (VITAMIN B1) injection 100 mg   [START ON 11/20/2022] thiamine (VITAMIN B1) tablet 100 mg   multivitamin with minerals tablet 1 tablet   LORazepam (ATIVAN) tablet 1 mg   hydrOXYzine (ATARAX) tablet 25 mg   loperamide (IMODIUM) capsule 2-4 mg   ondansetron (ZOFRAN-ODT) disintegrating tablet 4 mg   Medical Decision Making  Pt is a 42 y/o female w/ hx of scoliosis, cervical dysplasia, rectal pain, alcohol abuse, major depression, ptsd presenting [facility] on 11/19/22 w/ complaint of "I'm smoking crack. I'm drinking. I did meth." Pt requesting assistance with detox and getting connected to residential substance use treatment. Pt to be admitted to continuous assessment with possible [facility] admission tomorrow.  Meds ordered  this encounter  Medications   acetaminophen (TYLENOL) tablet 650 mg   alum & mag hydroxide-simeth (MAALOX/MYLANTA) 200-200-20 MG/5ML suspension 30 mL   magnesium hydroxide (MILK OF MAGNESIA) suspension 30 mL   traZODone (DESYREL) tablet 50 mg   nicotine (NICODERM CQ - dosed in mg/24 hours) patch 21 mg   thiamine (VITAMIN B1) injection 100 mg   thiamine (VITAMIN B1) tablet 100 mg   multivitamin with minerals tablet 1 tablet   LORazepam (ATIVAN) tablet 1 mg   hydrOXYzine (ATARAX) tablet 25 mg   loperamide (IMODIUM) capsule 2-4 mg   ondansetron (ZOFRAN-ODT) disintegrating tablet 4 mg   Lab Orders         SARS Coronavirus 2 by RT PCR (hospital order, performed in [place] hospital lab) *cepheid single result test* Anterior Nasal Swab         CBC with Differential/Platelet         Comprehensive metabolic panel         Hemoglobin A1c         Ethanol         Lipid panel         TSH         POC urine preg, ED         POCT Urine Drug Screen - (I-Screen)         POC SARS Coronavirus 2 Ag         Pregnancy, urine POC     Recommendations  Based on my evaluation the patient does not appear  to have an emergency medical condition.  Tharon Aquas, NP 11/19/22  3:18 PM

## 2022-11-19 NOTE — ED Notes (Signed)
[facility] on 11/20/22

## 2022-11-20 DIAGNOSIS — F1910 Other psychoactive substance abuse, uncomplicated: Secondary | ICD-10-CM | POA: Diagnosis not present

## 2022-11-20 DIAGNOSIS — F17200 Nicotine dependence, unspecified, uncomplicated: Secondary | ICD-10-CM | POA: Diagnosis not present

## 2022-11-20 DIAGNOSIS — Z1152 Encounter for screening for COVID-19: Secondary | ICD-10-CM | POA: Diagnosis not present

## 2022-11-20 LAB — HEMOGLOBIN A1C
Hgb A1c MFr Bld: 5.2 % (ref 4.8–5.6)
Mean Plasma Glucose: 103 mg/dL

## 2022-11-20 MED ORDER — TRAZODONE HCL 50 MG PO TABS: 50.0000 mg | ORAL_TABLET | Freq: Every evening | ORAL | Status: DC | PRN

## 2022-11-20 NOTE — ED Notes (Signed)
Pt sleeping@this time. Breathing even and unlabored. Will continue to monitor for safety 

## 2022-11-20 NOTE — ED Notes (Signed)
Patient given motrin for generalized pain and prn vistaril.  Will continue to monitor for safety

## 2022-11-20 NOTE — ED Notes (Signed)
Patient is in room in no sxs of distress - will continue to monitor for safety  
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
## 2022-11-20 NOTE — Progress Notes (Signed)
Received Lorien from OBS with a MHT. She was cooperative with the admission process and oriented to her new environment. She received medication for general body aches and Vistaril per her request for anxiety.  She denied all of the other psychiatric symptoms this AM.
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
## 2022-11-20 NOTE — Progress Notes (Signed)
Patient to transfer to [facility] today (11/20/22) before noon.  Provider to write [facility] admission orders.

## 2022-11-21 DIAGNOSIS — F1910 Other psychoactive substance abuse, uncomplicated: Secondary | ICD-10-CM | POA: Diagnosis not present

## 2022-11-21 DIAGNOSIS — Z1152 Encounter for screening for COVID-19: Secondary | ICD-10-CM | POA: Diagnosis not present

## 2022-11-21 DIAGNOSIS — F17200 Nicotine dependence, unspecified, uncomplicated: Secondary | ICD-10-CM | POA: Diagnosis not present

## 2022-11-21 MED ORDER — HYDROXYZINE HCL 25 MG PO TABS
25.0000 mg | ORAL_TABLET | Freq: Three times a day (TID) | ORAL | Status: DC | PRN
  Administered 2022-11-21: 25 mg via ORAL
  Filled 2022-11-21: qty 1

## 2022-11-21 MED ORDER — TRAZODONE HCL 100 MG PO TABS: 100.0000 mg | ORAL_TABLET | Freq: Every evening | ORAL | Status: DC | PRN

## 2022-11-21 NOTE — ED Provider Notes (Signed)
NP was called to the [facility] unit by RN to speak with this patient requesting a discharge. NP met with patient and who stated that she called her husband to come get her and she wanted to go home. Patient stated that she wanted to sleep in her own bed tonight. NP attempted to discuss the benefits of patient remaining in treatment, but patient insisted that she be discharged. Patient is voluntary and denies any SI/HI or AVH and is leaving AMA.

## 2022-11-21 NOTE — ED Notes (Signed)
Patient became tearful after a phone call and asked for medication.  25mg  vistaril given.  Patient stated "I want to run away."  RN attempted to explore with patient, her feelings and patient misinterpreted what writer was trying to say. She became loud, angry and tearful.  She went to her room and cried for several minutes.  After patient calmed down Clinical research associate and patient were able to talk about miscommunication and she was able to express herself.  Support was given and Clinical research associate helped patient make the connection of feeling like she wanted to run with the desire to use.  Patient stated she wanted to leave facility however was encouraged to wait until she established emotional equilibrium.  She is presently sitting in the day room watching tv with peers.  Will continue to monitor.

## 2022-11-21 NOTE — ED Notes (Signed)
Patient is in room in no sxs of distress - will continue to monitor for safety

## 2022-11-21 NOTE — ED Notes (Signed)
Patient complained of headache and was given 600 mg motrin for pain.  Patient presently sitting in dayroom waiting for lunch and socializing with peers.  Patient continues to be mildly irritable and entitled.  Behavior however is  in control and she is easily redirected.  Will monitor.  No evidence of withdrawal.
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
## 2022-11-21 NOTE — ED Notes (Signed)
Patient A&Ox4. Patient denies SI/Hi and AVH. Denies any physical complaints when asked. No acute distress noted. Patient appears irritated and anxious. Patient wants to be discharged. Nurse asked the patient to wait for the Nps to come talk to her. Support and encouragement provided. Routine safety checks conducted according to facility protocol.  Will continue to monitor for safety.

## 2022-11-21 NOTE — ED Notes (Signed)
Pt refused AA tonight said she doesn't need it.   Pt wants to go home since coming onto shift at 7pm and has been pacing the unit angry because she wants to leave.

## 2022-11-21 NOTE — ED Notes (Signed)
Patient awake for breakfast this morning.  She is irritable, sarcastic and entitled.  Patient somewhat demanding and is negativistic with regard to facility and stay.  Unit rules reinforced and RN oriented patient to staff in order to get needs met appropriately.  No evidence of withdrawal.  Will monitor and provide a safe environment for her.

## 2022-12-01 ENCOUNTER — Encounter (HOSPITAL_COMMUNITY): Payer: Self-pay

## 2022-12-01 ENCOUNTER — Emergency Department (HOSPITAL_COMMUNITY): Payer: Medicaid Other

## 2022-12-01 ENCOUNTER — Emergency Department (HOSPITAL_COMMUNITY)
Admission: EM | Admit: 2022-12-01 | Discharge: 2022-12-01 | Disposition: A | Discharge status: Home / Self Care | Payer: Medicaid Other | Attending: Emergency Medicine | Admitting: Emergency Medicine

## 2022-12-01 ENCOUNTER — Other Ambulatory Visit: Payer: Self-pay

## 2022-12-01 DIAGNOSIS — R519 Headache, unspecified: Secondary | ICD-10-CM | POA: Diagnosis not present

## 2022-12-01 DIAGNOSIS — S60211A Contusion of right wrist, initial encounter: Secondary | ICD-10-CM | POA: Insufficient documentation

## 2022-12-01 DIAGNOSIS — S20319A Abrasion of unspecified front wall of thorax, initial encounter: Secondary | ICD-10-CM | POA: Diagnosis not present

## 2022-12-01 DIAGNOSIS — M25531 Pain in right wrist: Secondary | ICD-10-CM | POA: Diagnosis present

## 2022-12-01 NOTE — ED Notes (Signed)
Pt refused d/c vitals.

## 2022-12-01 NOTE — ED Notes (Signed)
Pt. Given ice pack and water.
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
## 2022-12-01 NOTE — Discharge Instructions (Signed)
Head/neck CT's negative.  CXR also negative.  Refused wrist films.

## 2022-12-21 ENCOUNTER — Encounter: Payer: Self-pay | Admitting: Physician Assistant

## 2022-12-21 ENCOUNTER — Ambulatory Visit: Payer: Medicaid Other | Admitting: Physician Assistant

## 2022-12-21 VITALS — BP 112/70 | HR 89 | Ht 65.0 in | Wt 172.0 lb

## 2022-12-21 DIAGNOSIS — M4120 Other idiopathic scoliosis, site unspecified: Secondary | ICD-10-CM

## 2022-12-21 DIAGNOSIS — M2550 Pain in unspecified joint: Secondary | ICD-10-CM

## 2022-12-21 DIAGNOSIS — F319 Bipolar disorder, unspecified: Secondary | ICD-10-CM

## 2022-12-21 DIAGNOSIS — F1410 Cocaine abuse, uncomplicated: Secondary | ICD-10-CM

## 2022-12-21 DIAGNOSIS — F4310 Post-traumatic stress disorder, unspecified: Secondary | ICD-10-CM

## 2022-12-21 DIAGNOSIS — F1010 Alcohol abuse, uncomplicated: Secondary | ICD-10-CM

## 2022-12-21 DIAGNOSIS — G2581 Restless legs syndrome: Secondary | ICD-10-CM

## 2022-12-21 DIAGNOSIS — M158 Other polyosteoarthritis: Secondary | ICD-10-CM

## 2022-12-21 DIAGNOSIS — F17210 Nicotine dependence, cigarettes, uncomplicated: Secondary | ICD-10-CM | POA: Diagnosis not present

## 2022-12-21 DIAGNOSIS — Z720 Tobacco use: Secondary | ICD-10-CM

## 2022-12-21 DIAGNOSIS — E559 Vitamin D deficiency, unspecified: Secondary | ICD-10-CM

## 2022-12-21 DIAGNOSIS — F1210 Cannabis abuse, uncomplicated: Secondary | ICD-10-CM

## 2022-12-21 MED ORDER — METHYLPREDNISOLONE ACETATE 80 MG/ML IJ SUSP
80.0000 mg | Freq: Once | INTRAMUSCULAR | Status: AC
Start: 2022-12-21 — End: 2022-12-21
  Administered 2022-12-21: 80 mg via INTRAMUSCULAR

## 2022-12-21 MED ORDER — NICOTINE 21 MG/24HR TD PT24
21.0000 mg | MEDICATED_PATCH | Freq: Every day | TRANSDERMAL | 0 refills | Status: DC
Start: 2022-12-21 — End: 2023-11-15

## 2022-12-21 MED ORDER — TRAZODONE HCL 50 MG PO TABS
ORAL_TABLET | ORAL | 1 refills | Status: DC
Start: 2022-12-21 — End: 2023-09-02

## 2022-12-21 MED ORDER — MELOXICAM 7.5 MG PO TABS
7.5000 mg | ORAL_TABLET | Freq: Every day | ORAL | 0 refills | Status: DC
Start: 2022-12-21 — End: 2023-01-04

## 2022-12-21 MED ORDER — HYDROXYZINE HCL 25 MG PO TABS
25.0000 mg | ORAL_TABLET | ORAL | 0 refills | Status: DC | PRN
Start: 2022-12-21 — End: 2023-11-09

## 2022-12-21 MED ORDER — LAMOTRIGINE 25 MG PO TABS
ORAL_TABLET | ORAL | 0 refills | Status: DC
Start: 2022-12-21 — End: 2023-09-02

## 2022-12-21 MED ORDER — ROPINIROLE HCL 0.25 MG PO TABS
ORAL_TABLET | ORAL | 0 refills | Status: DC
Start: 2022-12-21 — End: 2023-01-04

## 2022-12-21 NOTE — Patient Instructions (Signed)
You are going to restart Lamictal.  Weeks 1 and 2 you will take 25 mg once daily.  Weeks 3 and 4 you will increase to 50 mg once daily.  For your restless legs you are going to start Requip 0.25 mg.  You will take 1 tab once daily 1 to 3 hours before bedtime, after 2 days you will increase to 2 tabs, after 1 week you will increase to 4 tabs.  For your insomnia, you are going to restart trazodone 50 mg at bedtime, you may increase 100 mg at bedtime after 2 days.  You are going to start nicotine patches 21 mg once daily.  For your arthritic pain, you are going to start Mobic 7.5 mg once daily.  For anxiety you are going to start hydroxyzine 25 mg every 4 hours as needed.  We will call you with today's lab results, you will follow-up with the mobile unit in 2 weeks.  Roney Jaffe, PA-C Physician Assistant [facility] Medicine [email or web address]  Arthritis Arthritis is a term that is commonly used to refer to joint pain or joint disease. There are more than 100 types of arthritis. What are the causes? The most common cause of this condition is wear and tear of a joint. Other causes include: Gout. Inflammation of a joint. An infection of a joint. Sprains and other injuries near the joint. A reaction to medicines or drugs, or an allergic reaction. In some cases, the cause may not be known. What are the signs or symptoms? The main symptom of this condition is pain in the joint during movement. Other symptoms include: Redness, swelling, or stiffness at a joint. Warmth coming from the joint. Fever. Overall feeling of illness. How is this diagnosed? This condition may be diagnosed with a physical exam and tests, including: Blood tests. Urine tests. Imaging tests, such as X-rays, an MRI, or a CT scan. Sometimes, fluid is removed from a joint for testing. How is this treated? This condition may be treated with: Treatment of the cause, if  it is known. Rest. Raising (elevating) the joint. Applying cold or hot packs to the joint. Medicines to improve symptoms and reduce inflammation. Injections of a steroid, such as cortisone, into the joint to help reduce pain and inflammation. Depending on the cause of your arthritis, you may need to make lifestyle changes to reduce stress on your joint. Changes may include: Exercising more. Losing weight. Follow these instructions at home: Medicines Take over-the-counter and prescription medicines only as told by your health care provider. Do not take aspirin to relieve pain if your health care provider thinks that gout may be causing your pain. Activity Rest your joint if told by your health care provider. Rest is important when your disease is active and your joint feels painful, swollen, or stiff. Avoid activities that make the pain worse. Balance activity with rest. Exercise your joint regularly with range-of-motion exercises as told by your health care provider. Try doing low-impact exercise, such as: Swimming. Water aerobics. Biking. Walking. Managing pain, stiffness, and swelling     If directed, put ice on the affected joint. To do this: Put ice in a plastic bag. Place a towel between your skin and the bag. Leave the ice on for 20 minutes, 2-3 times a day. Remove the ice if your skin turns bright red. This is very important. If you cannot feel pain, heat, or cold, you have a greater risk of damage to the area. If your joint  is swollen, raise (elevate) it above the level of your heart if directed by your health care provider. If your joint feels stiff in the morning, try taking a warm shower. If directed, apply heat to the affected area as often as told by your health care provider. Use the heat source that your health care provider recommends, such as a moist heat pack or a heating pad. To apply heat: Place a towel between your skin and the heat source. Leave the heat on for  20-30 minutes. Remove the heat if your skin turns bright red. This is especially important if you are unable to feel pain, heat, or cold. You have a greater risk of getting burned. General instructions Maintain a healthy weight. Follow instructions from your health care provider for weight control. Do not use any products that contain nicotine or tobacco. These products include cigarettes, chewing tobacco, and vaping devices, such as e-cigarettes. If you need help quitting, ask your health care provider. Keep all follow-up visits. This is important. Where to find more information [employer]: www.niams.http://www.myers.net/ Contact a health care provider if: The pain gets worse. You have a fever. Get help right away if: You develop severe joint pain, swelling, or redness. Many joints become painful and swollen. You develop severe back pain. You develop severe weakness in your leg. Summary Arthritis is a term that is commonly used to refer to joint pain or joint disease. There are more than 100 types of arthritis. The most common cause of this condition is wear and tear of a joint. Other causes include gout, inflammation or infection of the joint, sprains, or allergies. Symptoms of this condition include redness, swelling, or stiffness of the joint. Other symptoms include warmth, fever, or feeling ill. This condition is treated with rest, elevation, medicines, and applying cold or hot packs. Follow your health care provider's instructions about medicines, activity, exercises, and other home care treatments. This information is not intended to replace advice given to you by your health care provider. Make sure you discuss any questions you have with your health care provider. Document Revised: 05/13/2021 Document Reviewed: 05/13/2021 Elsevier Patient Education  2023 [employer].

## 2022-12-21 NOTE — Progress Notes (Signed)
New Patient Office Visit  Subjective    Patient ID: [name], female    DOB: Jun 17, 1981  Age: 42 y.o. MRN: [id number]  CC:  Chief Complaint  Patient presents with   Pain    Knees, hips, shoulders, and lower back pain- Pain is described aching pain, and sharp pain    restless legs     Problem for years    Insomnia    Has taken trazadone in the past    Anxiety   Manic Behavior    Hx of bipolar, has been off medication for 1 year     HPI [name] states that she is currently being treated for substance abuse at [facility].  States that she arrived 5 days ago.  States that she has several complaints today.  States that she has a history of bipolar disorder, has not been on medications for the past year.  Does state that Lamictal offered the most relief to her in the past.  States that she worked with her behavioral health specialist for "many years trying to find the right medication"  States that her anxiety has been strongly elevated.  States that she is having difficulty sleeping both falling asleep and staying asleep despite using melatonin and Benadryl.  States in the past that trazodone did offer relief, but does remember having to use 300 mg.  States that she has a history of restless leg syndrome, states that her legs feel restless the majority of the time, but does endorse it is worse at night.  States that she has osteoarthritis, states that it affects most of her joints, states that her pain is present hot showers in the morning do offer some relief.       Outpatient Encounter Medications as of 12/21/2022  Medication Sig   hydrOXYzine (ATARAX) 25 MG tablet Take 1 tablet (25 mg total) by mouth every 4 (four) hours as needed.   lamoTRIgine (LAMICTAL) 25 MG tablet Weeks 1 and 2 to 25 mg PO once daily: Weeks 3 and 4 take 50 mg/day  PO once daily   meloxicam (MOBIC) 7.5 MG tablet Take 1 tablet (7.5 mg total) by mouth daily.    nicotine (NICODERM CQ - DOSED IN MG/24 HOURS) 21 mg/24hr patch Place 1 patch (21 mg total) onto the skin daily.   rOPINIRole (REQUIP) 0.25 MG tablet Take 1 tab PO once daily 1 to 3 hours before bedtime,  after 2 days take  0.5 mg once daily,  after 7 days to 1 mg once daily if needed   traZODone (DESYREL) 50 MG tablet Take 1 tab PO QHS PRN, may increase to two tabs PO QHS PRN after 2 days   Facility-Administered Encounter Medications as of 12/21/2022  Medication   [COMPLETED] methylPREDNISolone acetate (DEPO-MEDROL) injection 80 mg   naproxen (NAPROSYN) tablet 375 mg    Past Medical History:  Diagnosis Date   Alcohol abuse    History of cervical dysplasia    CIN I  in  2004   Inflamed external hemorrhoid    Inflamed internal hemorrhoid    Major depression    PTSD (post-traumatic stress disorder)    Rectal pain    Scoliosis 11/12/2012    Past Surgical History:  Procedure Laterality Date   APPENDECTOMY  1994   CERVICAL BIOPSY  W/ LOOP ELECTRODE EXCISION  06-11-2003   EVALUATION UNDER ANESTHESIA WITH HEMORRHOIDECTOMY N/A 01/22/2014   Procedure: EXAM UNDER ANESTHESIA WITH HEMORRHOIDECTOMY;  Surgeon:  Romie Levee, MD;  Location: [facility];  Service: General;  Laterality: N/A;   TUBAL LIGATION  05-08-2005   W/  FILSHIE CLIPS    Family History  Problem Relation Age of Onset   Cancer Father        prostate   Cancer Sister        bladder   Lung disease Mother    Cancer Maternal Uncle        Lung   Cancer Paternal Aunt        Breast   Cancer Maternal Grandfather    Cancer Paternal Grandfather    Cancer Paternal Aunt        Breast    Social History   Socioeconomic History   Marital status: Married    Spouse name: Not on file   Number of children: Not on file   Years of education: Not on file   Highest education level: Not on file  Occupational History   Not on file  Tobacco Use   Smoking status: Every Day    Packs/day: 0.50    Years: 15.00     Additional pack years: 0.00    Total pack years: 7.50    Types: Cigarettes   Smokeless tobacco: Never  Vaping Use   Vaping Use: Never used  Substance and Sexual Activity   Alcohol use: Yes   Drug use: Yes    Types: Marijuana    Comment: pt denies (there is documented hx cannibus use)   Sexual activity: Yes    Birth control/protection: Surgical  Other Topics Concern   Not on file  Social History Narrative   Pt also has hx of an aunt and uncle with lung cancer.          Social Determinants of Health   Financial Resource Strain: Not on file  Food Insecurity: Food Insecurity Present (11/20/2022)   Hunger Vital Sign    Worried About Running Out of Food in the Last Year: Sometimes true    Ran Out of Food in the Last Year: Sometimes true  Transportation Needs: No Transportation Needs (11/20/2022)   PRAPARE - Administrator, Civil Service (Medical): No    Lack of Transportation (Non-Medical): No  Physical Activity: Not on file  Stress: Not on file  Social Connections: Not on file  Intimate Partner Violence: Not At Risk (11/20/2022)   Humiliation, Afraid, Rape, and Kick questionnaire    Fear of Current or Ex-Partner: No    Emotionally Abused: No    Physically Abused: No    Sexually Abused: No    Review of Systems  Constitutional: Negative.   HENT: Negative.    Eyes: Negative.   Respiratory:  Negative for shortness of breath.   Cardiovascular:  Negative for chest pain.  Gastrointestinal: Negative.   Genitourinary: Negative.   Musculoskeletal:  Positive for back pain, joint pain and myalgias.  Skin: Negative.   Neurological: Negative.   Endo/Heme/Allergies: Negative.   Psychiatric/Behavioral:  Positive for depression. The patient is nervous/anxious and has insomnia.         Objective    BP 112/70 (BP Location: Left Arm, Patient Position: Sitting, Cuff Size: Normal)   Pulse 89   Ht 5\' 5"  (1.651 m)   Wt 172 lb (78 kg)   LMP 11/16/2022   SpO2 97%   BMI 28.62  kg/m   Physical Exam Vitals and nursing note reviewed.  Constitutional:      Appearance: Normal appearance.  HENT:     Head: Normocephalic and atraumatic.     Right Ear: External ear normal.     Left Ear: External ear normal.     Nose: Nose normal.     Mouth/Throat:     Mouth: Mucous membranes are moist.     Pharynx: Oropharynx is clear.  Eyes:     Extraocular Movements: Extraocular movements intact.     Conjunctiva/sclera: Conjunctivae normal.     Pupils: Pupils are equal, round, and reactive to light.  Cardiovascular:     Rate and Rhythm: Normal rate and regular rhythm.     Pulses: Normal pulses.     Heart sounds: Normal heart sounds.  Pulmonary:     Effort: Pulmonary effort is normal.     Breath sounds: Normal breath sounds.  Musculoskeletal:        General: Normal range of motion.     Cervical back: Normal range of motion and neck supple.  Skin:    General: Skin is warm and dry.  Neurological:     General: No focal deficit present.     Mental Status: She is alert and oriented to person, place, and time.  Psychiatric:        Attention and Perception: Attention normal.        Mood and Affect: Mood is anxious.        Speech: Speech normal.        Behavior: Behavior normal.        Thought Content: Thought content normal.        Judgment: Judgment normal.       Assessment & Plan:   Problem List Items Addressed This Visit       Musculoskeletal and Integument   Degenerative joint disease involving multiple joints   Relevant Medications   meloxicam (MOBIC) 7.5 MG tablet     Other   Cannabis abuse (Chronic)   Alcohol abuse (Chronic)   Tobacco abuse   Relevant Medications   nicotine (NICODERM CQ - DOSED IN MG/24 HOURS) 21 mg/24hr patch   PTSD (post-traumatic stress disorder)   Relevant Medications   traZODone (DESYREL) 50 MG tablet   hydrOXYzine (ATARAX) 25 MG tablet   Cocaine abuse (HCC)   Bipolar I disorder (HCC) - Primary   Relevant Medications    lamoTRIgine (LAMICTAL) 25 MG tablet   traZODone (DESYREL) 50 MG tablet   Restless legs   Relevant Medications   rOPINIRole (REQUIP) 0.25 MG tablet   Other Visit Diagnoses     Hypocalcemia       Relevant Orders   Basic Metabolic Panel (Completed)      1. Bipolar I disorder (HCC) Initiate restart titration of Lamictal.  Patient to follow-up with mobile unit in 2 weeks for further evaluation.  Restart trazodone, patient has not used this medication in approximately 1 year, will start at 50 mg and titrate up as needed.  Red flags given for prompt reevaluation - lamoTRIgine (LAMICTAL) 25 MG tablet; Weeks 1 and 2 to 25 mg PO once daily: Weeks 3 and 4 take 50 mg/day  PO once daily  Dispense: 42 tablet; Refill: 0 - traZODone (DESYREL) 50 MG tablet; Take 1 tab PO QHS PRN, may increase to two tabs PO QHS PRN after 2 days  Dispense: 60 tablet; Refill: 1  2. PTSD (post-traumatic stress disorder) Trial hydroxyzine - hydrOXYzine (ATARAX) 25 MG tablet; Take 1 tablet (25 mg total) by mouth every 4 (four) hours as needed.  Dispense: 120 tablet; Refill:  0  3. Polyarthralgia  Trial Mobic.  Patient given Depo-Medrol injection in clinic.  Patient encouraged to continue with supportive care   IMPRESSION: 1. Motion limited head CT without evidence of acute intracranial CT findings or depressed skull fractures. 2. Ethmoid sinus disease. 3. Reversed cervical lordosis with 2 mm likely degenerative anterolisthesis at C3-4 and C4-5. No traumatic listhesis or fractures are seen. 4. Degenerative changes of the cervical spine. 5. 1.3 cm hypodense nodule in the right lobe of the thyroid gland. 6. Question of patchy ground-glass infiltrates in the left upper lobe apex which could be due to chronic interstitial disease or pneumonitis.     Electronically Signed   By: Almira Bar M.D.   On: 12/01/2022 05:26  - Vitamin D, 25-hydroxy - methylPREDNISolone acetate (DEPO-MEDROL) injection 80 mg -  meloxicam (MOBIC) 7.5 MG tablet; Take 1 tablet (7.5 mg total) by mouth daily.  Dispense: 30 tablet; Refill: 0  4. Restless legs Trial Requip.  Patient education given on supportive care - rOPINIRole (REQUIP) 0.25 MG tablet; Take 1 tab PO once daily 1 to 3 hours before bedtime,  after 2 days take  0.5 mg once daily,  after 7 days to 1 mg once daily if needed  Dispense: 96 tablet; Refill: 0  5. Hypocalcemia  - Basic Metabolic Panel  6. Alcohol abuse Currently in substance abuse treatment program  7. Cannabis abuse   8. Cocaine abuse (HCC)   9. Tobacco abuse  - nicotine (NICODERM CQ - DOSED IN MG/24 HOURS) 21 mg/24hr patch; Place 1 patch (21 mg total) onto the skin daily.  Dispense: 28 patch; Refill: 0   I have reviewed the patient's medical history (PMH, PSH, Social History, Family History, Medications, and allergies) , and have been updated if relevant. I spent 30 minutes reviewing chart and  face to face time with patient.    Return in about 2 weeks (around 01/04/2023).   Kasandra Knudsen Mayers, PA-C

## 2022-12-22 ENCOUNTER — Encounter: Payer: Self-pay | Admitting: Physician Assistant

## 2022-12-22 DIAGNOSIS — F319 Bipolar disorder, unspecified: Secondary | ICD-10-CM | POA: Insufficient documentation

## 2022-12-22 DIAGNOSIS — G2581 Restless legs syndrome: Secondary | ICD-10-CM | POA: Insufficient documentation

## 2022-12-22 DIAGNOSIS — M159 Polyosteoarthritis, unspecified: Secondary | ICD-10-CM | POA: Insufficient documentation

## 2022-12-22 LAB — BASIC METABOLIC PANEL
BUN/Creatinine Ratio: 18 (ref 9–23)
BUN: 12 mg/dL (ref 6–24)
CO2: 16 mmol/L — ABNORMAL LOW (ref 20–29)
Calcium: 9.2 mg/dL (ref 8.7–10.2)
Chloride: 102 mmol/L (ref 96–106)
Creatinine, Ser: 0.68 mg/dL (ref 0.57–1.00)
Glucose: 80 mg/dL (ref 70–99)
Potassium: 4.2 mmol/L (ref 3.5–5.2)
Sodium: 139 mmol/L (ref 134–144)
eGFR: 111 mL/min/{1.73_m2} (ref >59)

## 2022-12-22 LAB — VITAMIN D 25 HYDROXY (VIT D DEFICIENCY, FRACTURES): Vit D, 25-Hydroxy: 15.2 ng/mL — ABNORMAL LOW (ref 30.0–100.0)

## 2022-12-22 MED ORDER — VITAMIN D (ERGOCALCIFEROL) 1.25 MG (50000 UNIT) PO CAPS
50000.0000 [IU] | ORAL_CAPSULE | ORAL | 2 refills | Status: DC
Start: 2022-12-22 — End: 2023-11-15

## 2022-12-22 NOTE — Addendum Note (Signed)
Addended by: Roney Jaffe on: 12/22/2022 09:59 AM   Modules accepted: Orders

## 2023-01-04 ENCOUNTER — Encounter: Payer: Self-pay | Admitting: Physician Assistant

## 2023-01-04 ENCOUNTER — Ambulatory Visit: Payer: Medicaid Other | Admitting: Physician Assistant

## 2023-01-04 VITALS — BP 107/65 | HR 88 | Ht 65.0 in | Wt 174.0 lb

## 2023-01-04 DIAGNOSIS — F4310 Post-traumatic stress disorder, unspecified: Secondary | ICD-10-CM

## 2023-01-04 DIAGNOSIS — F1210 Cannabis abuse, uncomplicated: Secondary | ICD-10-CM

## 2023-01-04 DIAGNOSIS — F1010 Alcohol abuse, uncomplicated: Secondary | ICD-10-CM

## 2023-01-04 DIAGNOSIS — Z720 Tobacco use: Secondary | ICD-10-CM

## 2023-01-04 DIAGNOSIS — G2581 Restless legs syndrome: Secondary | ICD-10-CM | POA: Diagnosis not present

## 2023-01-04 DIAGNOSIS — F1410 Cocaine abuse, uncomplicated: Secondary | ICD-10-CM

## 2023-01-04 DIAGNOSIS — M2559 Pain in other specified joint: Secondary | ICD-10-CM

## 2023-01-04 DIAGNOSIS — E559 Vitamin D deficiency, unspecified: Secondary | ICD-10-CM

## 2023-01-04 DIAGNOSIS — F319 Bipolar disorder, unspecified: Secondary | ICD-10-CM

## 2023-01-04 DIAGNOSIS — M2550 Pain in unspecified joint: Secondary | ICD-10-CM

## 2023-01-04 DIAGNOSIS — F17210 Nicotine dependence, cigarettes, uncomplicated: Secondary | ICD-10-CM

## 2023-01-04 MED ORDER — MELOXICAM 15 MG PO TABS
15.0000 mg | ORAL_TABLET | Freq: Every day | ORAL | 1 refills | Status: DC
Start: 2023-01-04 — End: 2023-11-15

## 2023-01-04 MED ORDER — ROPINIROLE HCL 2 MG PO TABS
2.0000 mg | ORAL_TABLET | Freq: Every day | ORAL | 0 refills | Status: DC
Start: 2023-01-04 — End: 2023-11-15

## 2023-01-04 NOTE — Patient Instructions (Signed)
To help with your osteoarthritis, you will increase Mobic 15 mg once a day.  It is okay to take an additional 7.5 mg dose today and start the 15 mg dose tomorrow.  I started a referral for you to be seen by orthopedics for further evaluation.  To help with your restless legs, you will increase Requip 2 mg once daily at bedtime.  Please let us know if there is any else we can do for you.  Roney Jaffe, PA-C Physician Assistant [facility] [email or web address]   Vitamin D Deficiency Vitamin D deficiency is when your body does not have enough vitamin D. Vitamin D is important to your body because: It helps the body maintain calcium and phosphorus levels. These are important minerals. It plays a role in bone health. It reduces inflammation. It improves the body's defense system (immune system). If vitamin D deficiency is severe, it can cause a condition in which your bones become soft. In adults, this condition is called osteomalacia. In children, this condition is called rickets. What are the causes? This condition may be caused by: Not eating enough foods that contain vitamin D. Not getting enough natural sun exposure. Having certain digestive system diseases that make it difficult for your body to absorb vitamin D. These diseases include Crohn's disease, long-term (chronic) pancreatitis, and cystic fibrosis. Having had a surgery in which a part of the stomach or a part of the small intestine was removed. What increases the risk? You are more likely to develop this condition if you: Are an older adult. Do not spend much time outdoors. Live in a long-term care facility. Have dark skin. Take certain medicines, such as steroid medicines or certain seizure medicines. Are overweight or obese. Have chronic kidney or liver disease. What are the signs or symptoms? In mild cases of vitamin D deficiency, there may not be any symptoms. If  the condition is severe, symptoms may include: Bone pain. Muscle pain. Not being able to walk normally (abnormal gait). Broken bones caused by a minor injury. Joint pain. How is this diagnosed? This condition may be diagnosed with blood tests. Imaging tests such as X-rays may also be done to look for changes in the bone. How is this treated? Treatment may include taking supplements as told by your health care provider. Your health care provider will tell you what dose is best for you. Supplements may include: Vitamin D. Calcium. Follow these instructions at home: Eating and drinking Eat foods that contain vitamin D, such as: Dairy products, cereals, or juices that have vitamin D added to them (are fortified). Check the label. Fish, such as salmon or trout. Eggs. The vitamin D is in the yolk. Mushrooms that were treated with UV light. Beef liver. The items listed above may not be a complete list of foods and beverages you can eat and drink. Contact a dietitian for more information. General instructions Take over-the-counter and prescription medicines only as told by your health care provider. Take supplements only as told by your health care provider. Get regular, safe exposure to natural sunlight. Do not use a tanning bed. Maintain a healthy weight. Lose weight if needed. Keep all follow-up visits. This is important. How is this prevented? You can get vitamin D by: Eating foods that naturally contain vitamin D. Eating or drinking products that have been fortified with vitamin D, such as cereals, juices, and dairy products, including milk. Taking a vitamin D supplement or a multivitamin that contains vitamin D.  Being in the sun. Your body naturally makes vitamin D when your skin is exposed to sunlight. Your body changes the sunlight into a form of the vitamin that it can use. Contact a health care provider if: Your symptoms do not go away. You feel nauseous or you vomit. You have  fewer bowel movements than usual or are constipated. Summary Vitamin D deficiency is when your body does not have enough vitamin D. Vitamin D helps to keep your bones healthy. Vitamin D deficiency is primarily treated by taking supplements. Your health care provider will suggest what dose is best for you. You can get vitamin D by eating foods that contain vitamin D, by being in the sun, and by taking a vitamin D supplement or a multivitamin that contains vitamin D. This information is not intended to replace advice given to you by your health care provider. Make sure you discuss any questions you have with your health care provider. Document Revised: 05/09/2021 Document Reviewed: 05/09/2021 Elsevier Patient Education  2023 [employer].

## 2023-01-04 NOTE — Progress Notes (Signed)
Established Patient Office Visit  Subjective   Patient ID: [name], female    DOB: [birth year]  Age: 42 y.o. MRN: [id number]  Chief Complaint  Patient presents with   body pain     States that she continues to be treated for substance abuse at [facility], states that she will be going to her home pain [place] at the end of next week.  She will be attending outpatient counseling.  States that she continues to have pain from her osteoarthritis.  States that the Mobic does offer mild relief, but continues to need to use Tylenol every 8 hours.  States that she continues to have restless legs, states that it did improve and is currently taking 1 mg at bedtime, but states that it is still present and makes it difficult for her to concentrate.  States it is present throughout the day.    States that she did start the vitamin D and has taking it once weekly for 2 weeks.     Past Medical History:  Diagnosis Date   Alcohol abuse    History of cervical dysplasia    CIN I  in  2004   Inflamed external hemorrhoid    Inflamed internal hemorrhoid    Major depression    PTSD (post-traumatic stress disorder)    Rectal pain    Scoliosis 11/12/2012   Social History   Socioeconomic History   Marital status: Married    Spouse name: Not on file   Number of children: Not on file   Years of education: Not on file   Highest education level: Not on file  Occupational History   Not on file  Tobacco Use   Smoking status: Every Day    Packs/day: 0.50    Years: 15.00    Additional pack years: 0.00    Total pack years: 7.50    Types: Cigarettes   Smokeless tobacco: Never  Vaping Use   Vaping Use: Never used  Substance and Sexual Activity   Alcohol use: Yes   Drug use: Yes    Types: Marijuana    Comment: pt denies (there is documented hx cannibus use)   Sexual activity: Yes    Birth control/protection: Surgical  Other Topics Concern   Not on file   Social History Narrative   Pt also has hx of an aunt and uncle with lung cancer.          Social Determinants of Health   Financial Resource Strain: Not on file  Food Insecurity: Food Insecurity Present (11/20/2022)   Hunger Vital Sign    Worried About Running Out of Food in the Last Year: Sometimes true    Ran Out of Food in the Last Year: Sometimes true  Transportation Needs: No Transportation Needs (11/20/2022)   PRAPARE - Administrator, Civil Service (Medical): No    Lack of Transportation (Non-Medical): No  Physical Activity: Not on file  Stress: Not on file  Social Connections: Not on file  Intimate Partner Violence: Not At Risk (11/20/2022)   Humiliation, Afraid, Rape, and Kick questionnaire    Fear of Current or Ex-Partner: No    Emotionally Abused: No    Physically Abused: No    Sexually Abused: No   Family History  Problem Relation Age of Onset   Cancer Father        prostate   Cancer Sister        bladder   Lung  disease Mother    Cancer Maternal Uncle        Lung   Cancer Paternal Aunt        Breast   Cancer Maternal Grandfather    Cancer Paternal Grandfather    Cancer Paternal Aunt        Breast   No Known Allergies  Review of Systems  Constitutional:  Negative for chills and fever.  HENT: Negative.    Eyes: Negative.   Respiratory:  Negative for shortness of breath.   Cardiovascular:  Negative for chest pain.  Gastrointestinal: Negative.   Genitourinary: Negative.   Musculoskeletal:  Positive for joint pain and myalgias.  Skin: Negative.   Neurological: Negative.   Endo/Heme/Allergies: Negative.   Psychiatric/Behavioral: Negative.        Objective:     BP 107/65 (BP Location: Left Arm, Patient Position: Sitting, Cuff Size: Large)   Pulse 88   Ht 5\' 5"  (1.651 m)   Wt 174 lb (78.9 kg)   LMP 11/16/2022   SpO2 97%   BMI 28.96 kg/m    Physical Exam Vitals and nursing note reviewed.  Constitutional:      Appearance: Normal  appearance.  HENT:     Head: Normocephalic and atraumatic.     Right Ear: External ear normal.     Left Ear: External ear normal.     Nose: Nose normal.     Mouth/Throat:     Mouth: Mucous membranes are moist.     Pharynx: Oropharynx is clear.  Eyes:     Extraocular Movements: Extraocular movements intact.     Conjunctiva/sclera: Conjunctivae normal.     Pupils: Pupils are equal, round, and reactive to light.  Cardiovascular:     Rate and Rhythm: Normal rate and regular rhythm.     Pulses: Normal pulses.     Heart sounds: Normal heart sounds.  Pulmonary:     Effort: Pulmonary effort is normal.     Breath sounds: Normal breath sounds.  Musculoskeletal:     Right shoulder: Tenderness present. Decreased range of motion.     Left shoulder: Tenderness present. Decreased range of motion.     Cervical back: Normal, normal range of motion and neck supple.     Thoracic back: Normal.     Lumbar back: Decreased range of motion.     Right hip: Bony tenderness and crepitus present. Decreased range of motion.     Left hip: Bony tenderness and crepitus present. Decreased range of motion.  Skin:    General: Skin is warm and dry.  Neurological:     General: No focal deficit present.     Mental Status: She is oriented to person, place, and time.  Psychiatric:        Mood and Affect: Mood normal.        Behavior: Behavior normal.        Thought Content: Thought content normal.        Judgment: Judgment normal.       Assessment & Plan:   Problem List Items Addressed This Visit       Other   Cannabis abuse (Chronic)   Alcohol abuse (Chronic)   Tobacco abuse   PTSD (post-traumatic stress disorder)   Polyarthralgia   Relevant Medications   meloxicam (MOBIC) 15 MG tablet   Other Relevant Orders   Ambulatory referral to Orthopedic Surgery   Cocaine abuse (HCC)   Bipolar I disorder (HCC) - Primary   Restless legs  Relevant Medications   rOPINIRole (REQUIP) 2 MG tablet   Vitamin D  deficiency  1. Restless legs Increase Requip 2 mg daily.  Patient encouraged to follow-up with primary care provider or return to the mobile unit for further evaluation after returning home.  Patient understands and agrees.  Red flags given for prompt reevaluation - rOPINIRole (REQUIP) 2 MG tablet; Take 1 tablet (2 mg total) by mouth at bedtime.  Dispense: 30 tablet; Refill: 0  2. Polyarthralgia Increase Mobic 15 mg, refer to orthopedics for further evaluation - meloxicam (MOBIC) 15 MG tablet; Take 1 tablet (15 mg total) by mouth daily.  Dispense: 30 tablet; Refill: 1 - Ambulatory referral to Orthopedic Surgery  3. Vitamin D deficiency Continue current regimen, no refill needed today  4. Bipolar I disorder (HCC) No refill needed today  5. PTSD (post-traumatic stress disorder) No refill needed today  6. Alcohol abuse Currently in substance abuse treatment program  7. Cannabis abuse   8. Cocaine abuse (HCC)   9. Tobacco abuse    I have reviewed the patient's medical history (PMH, PSH, Social History, Family History, Medications, and allergies) , and have been updated if relevant. I spent 30 minutes reviewing chart and  face to face time with patient.     Return if symptoms worsen or fail to improve.    Kasandra Knudsen Mayers, PA-C

## 2023-02-02 ENCOUNTER — Ambulatory Visit: Payer: Self-pay | Admitting: *Deleted
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
## 2023-02-02 NOTE — Telephone Encounter (Signed)
  Chief Complaint: Has run out of her Lamictal for 2 days and needs a refill.   Released from [facility] 2 weeks ago and can't get a refill. Symptoms: Anxious about being out of her medicine for 2 days and unable to get a refill anywhere. Frequency: Out of medicine for the last 2 days. Pertinent Negatives: Patient denies having a psych dr or PCP.   Disposition: [x] ED /[] Urgent Care (no appt availability in office) / [] Appointment(In office/virtual)/ []  [facility]/ [] Home Care/ [] Refused Recommended Disposition /[] [facility]/ []  Follow-up with PCP Additional Notes: I have referred her to the [facility] Crisis [facility] in [place].    She is agreeable and willing to go there so I gave her all the needed information, location and phone number and that she can go 24/7 anytime.   She doesn't have transportation until her husband gets home from work.   She thanked me very much for my assistance.

## 2023-03-12 ENCOUNTER — Other Ambulatory Visit: Payer: Self-pay | Admitting: Physician Assistant

## 2023-03-12 DIAGNOSIS — M2550 Pain in unspecified joint: Secondary | ICD-10-CM

## 2023-03-15 NOTE — Telephone Encounter (Signed)
Requested Prescriptions  Refused Prescriptions Disp Refills   meloxicam (MOBIC) 15 MG tablet [Pharmacy Med Name: Meloxicam 15 MG Oral Tablet] 30 tablet 0    Sig: Take 1 tablet by mouth once daily     There is no refill protocol information for this order

## 2023-09-02 ENCOUNTER — Emergency Department (HOSPITAL_COMMUNITY)
Admission: EM | Admit: 2023-09-02 | Discharge: 2023-09-02 | Disposition: A | Discharge status: Home / Self Care | Payer: MEDICAID | Attending: Emergency Medicine | Admitting: Emergency Medicine

## 2023-09-02 ENCOUNTER — Other Ambulatory Visit: Payer: Self-pay

## 2023-09-02 ENCOUNTER — Encounter (HOSPITAL_COMMUNITY): Payer: Self-pay

## 2023-09-02 DIAGNOSIS — Z760 Encounter for issue of repeat prescription: Secondary | ICD-10-CM | POA: Diagnosis present

## 2023-09-02 MED ORDER — KETOROLAC TROMETHAMINE 15 MG/ML IJ SOLN
15.0000 mg | Freq: Once | INTRAMUSCULAR | Status: AC
  Administered 2023-09-02: 15 mg via INTRAMUSCULAR
  Filled 2023-09-02: qty 1

## 2023-09-02 MED ORDER — LAMOTRIGINE 25 MG PO CHEW: CHEWABLE_TABLET | ORAL | 0 refills | Status: DC

## 2023-09-02 MED ORDER — TRAZODONE HCL 50 MG PO TABS: 50.0000 mg | ORAL_TABLET | Freq: Every day | ORAL | 0 refills | Status: DC

## 2023-09-02 NOTE — Discharge Instructions (Addendum)
You were seen today for needing a refill of medication.  I refilled your Lamictal and your trazodone.  For your Lamictal, you are to take 25 mg each day for 2 weeks, third week you are to take 25 mg twice a day, fourth week take 50 mg twice a day, fifth week take 100 mg twice a day.  You been experiencing rash, fever, shortness breath, chest pain, weakness, numbness, tingling return to the ER for reevaluation.  I have also attached resources for [facility].  Reach out to them to help briefly medications in the future.

## 2023-09-02 NOTE — ED Triage Notes (Signed)
Pt reports being out of her lamictal x 3 weeks.
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
## 2023-11-08 ENCOUNTER — Other Ambulatory Visit: Payer: Self-pay

## 2023-11-08 ENCOUNTER — Observation Stay (HOSPITAL_COMMUNITY)
Admission: EM | Admit: 2023-11-08 | Discharge: 2023-11-10 | Disposition: A | Discharge status: Other Acute Inpatient Hospital | Payer: MEDICAID | Attending: Internal Medicine | Admitting: Internal Medicine

## 2023-11-08 DIAGNOSIS — G2581 Restless legs syndrome: Secondary | ICD-10-CM | POA: Insufficient documentation

## 2023-11-08 DIAGNOSIS — F322 Major depressive disorder, single episode, severe without psychotic features: Secondary | ICD-10-CM | POA: Diagnosis present

## 2023-11-08 DIAGNOSIS — F1010 Alcohol abuse, uncomplicated: Secondary | ICD-10-CM | POA: Diagnosis not present

## 2023-11-08 DIAGNOSIS — F341 Dysthymic disorder: Secondary | ICD-10-CM | POA: Insufficient documentation

## 2023-11-08 DIAGNOSIS — F17210 Nicotine dependence, cigarettes, uncomplicated: Secondary | ICD-10-CM | POA: Insufficient documentation

## 2023-11-08 DIAGNOSIS — X838XXA Intentional self-harm by other specified means, initial encounter: Secondary | ICD-10-CM | POA: Insufficient documentation

## 2023-11-08 DIAGNOSIS — Z7982 Long term (current) use of aspirin: Secondary | ICD-10-CM | POA: Diagnosis not present

## 2023-11-08 DIAGNOSIS — F32A Depression, unspecified: Secondary | ICD-10-CM | POA: Diagnosis present

## 2023-11-08 DIAGNOSIS — T1491XA Suicide attempt, initial encounter: Secondary | ICD-10-CM | POA: Diagnosis not present

## 2023-11-08 DIAGNOSIS — F332 Major depressive disorder, recurrent severe without psychotic features: Secondary | ICD-10-CM | POA: Diagnosis not present

## 2023-11-08 DIAGNOSIS — R45851 Suicidal ideations: Principal | ICD-10-CM

## 2023-11-08 DIAGNOSIS — E876 Hypokalemia: Secondary | ICD-10-CM | POA: Diagnosis not present

## 2023-11-08 DIAGNOSIS — G8929 Other chronic pain: Secondary | ICD-10-CM | POA: Insufficient documentation

## 2023-11-08 DIAGNOSIS — R4182 Altered mental status, unspecified: Secondary | ICD-10-CM | POA: Diagnosis present

## 2023-11-08 DIAGNOSIS — Z7902 Long term (current) use of antithrombotics/antiplatelets: Secondary | ICD-10-CM | POA: Diagnosis not present

## 2023-11-08 DIAGNOSIS — F319 Bipolar disorder, unspecified: Secondary | ICD-10-CM | POA: Diagnosis not present

## 2023-11-08 DIAGNOSIS — Z79899 Other long term (current) drug therapy: Secondary | ICD-10-CM | POA: Insufficient documentation

## 2023-11-08 DIAGNOSIS — T50902A Poisoning by unspecified drugs, medicaments and biological substances, intentional self-harm, initial encounter: Secondary | ICD-10-CM | POA: Diagnosis not present

## 2023-11-08 LAB — CBC WITH DIFFERENTIAL/PLATELET
Abs Immature Granulocytes: 0.03 10*3/uL (ref 0.00–0.07)
Basophils Absolute: 0 10*3/uL (ref 0.0–0.1)
Basophils Relative: 0 %
Eosinophils Absolute: 0.1 10*3/uL (ref 0.0–0.5)
Eosinophils Relative: 1 %
HCT: 45.6 % (ref 36.0–46.0)
Hemoglobin: 15.6 g/dL — ABNORMAL HIGH (ref 12.0–15.0)
Immature Granulocytes: 0 %
Lymphocytes Relative: 22 %
Lymphs Abs: 2 10*3/uL (ref 0.7–4.0)
MCH: 32.6 pg (ref 26.0–34.0)
MCHC: 34.2 g/dL (ref 30.0–36.0)
MCV: 95.4 fL (ref 80.0–100.0)
Monocytes Absolute: 0.5 10*3/uL (ref 0.1–1.0)
Monocytes Relative: 5 %
Neutro Abs: 6.5 10*3/uL (ref 1.7–7.7)
Neutrophils Relative %: 72 %
Platelets: 295 10*3/uL (ref 150–400)
RBC: 4.78 MIL/uL (ref 3.87–5.11)
RDW: 12.4 % (ref 11.5–15.5)
WBC: 9.1 10*3/uL (ref 4.0–10.5)
nRBC: 0 % (ref 0.0–0.2)

## 2023-11-08 LAB — URINALYSIS, ROUTINE W REFLEX MICROSCOPIC
Bilirubin Urine: NEGATIVE
Glucose, UA: NEGATIVE mg/dL
Ketones, ur: NEGATIVE mg/dL
Leukocytes,Ua: NEGATIVE
Nitrite: NEGATIVE
Protein, ur: 30 mg/dL — AB
Specific Gravity, Urine: 1.005 (ref 1.005–1.030)
pH: 5 (ref 5.0–8.0)

## 2023-11-08 LAB — ACETAMINOPHEN LEVEL: Acetaminophen (Tylenol), Serum: <10 ug/mL — ABNORMAL LOW (ref 10–30)

## 2023-11-08 LAB — COMPREHENSIVE METABOLIC PANEL
ALT: 15 U/L (ref 0–44)
AST: 20 U/L (ref 15–41)
Albumin: 4.5 g/dL (ref 3.5–5.0)
Alkaline Phosphatase: 75 U/L (ref 38–126)
Anion gap: 14 (ref 5–15)
BUN: 6 mg/dL (ref 6–20)
CO2: 20 mmol/L — ABNORMAL LOW (ref 22–32)
Calcium: 9 mg/dL (ref 8.9–10.3)
Chloride: 104 mmol/L (ref 98–111)
Creatinine, Ser: 0.7 mg/dL (ref 0.44–1.00)
GFR, Estimated: >60 mL/min (ref >60)
Glucose, Bld: 113 mg/dL — ABNORMAL HIGH (ref 70–99)
Potassium: 3.6 mmol/L (ref 3.5–5.1)
Sodium: 138 mmol/L (ref 135–145)
Total Bilirubin: 0.4 mg/dL (ref 0.0–1.2)
Total Protein: 8.3 g/dL — ABNORMAL HIGH (ref 6.5–8.1)

## 2023-11-08 LAB — RAPID URINE DRUG SCREEN, HOSP PERFORMED
Amphetamines: NOT DETECTED
Barbiturates: NOT DETECTED
Benzodiazepines: POSITIVE — AB
Cocaine: NOT DETECTED
Opiates: NOT DETECTED
Tetrahydrocannabinol: POSITIVE — AB

## 2023-11-08 LAB — HCG, QUANTITATIVE, PREGNANCY: hCG, Beta Chain, Quant, S: 1 m[IU]/mL (ref <5)

## 2023-11-08 LAB — SALICYLATE LEVEL: Salicylate Lvl: <7 mg/dL — ABNORMAL LOW (ref 7.0–30.0)

## 2023-11-08 LAB — ETHANOL: Alcohol, Ethyl (B): 106 mg/dL — ABNORMAL HIGH (ref <10)

## 2023-11-08 MED ORDER — SODIUM CHLORIDE 0.9 % IV BOLUS
1000.0000 mL | Freq: Once | INTRAVENOUS | Status: AC
  Administered 2023-11-08: 1000 mL via INTRAVENOUS
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
## 2023-11-08 NOTE — ED Notes (Signed)
 Provided patient with sand which, beverage and graham crackers at this time

## 2023-11-08 NOTE — ED Notes (Signed)
 Patient cooperative upon arrival 4 point restraints placed by medic has been removed. Patient agreed to remain calm and cooperative

## 2023-11-09 ENCOUNTER — Encounter (HOSPITAL_COMMUNITY): Payer: Self-pay | Admitting: Internal Medicine

## 2023-11-09 DIAGNOSIS — F32A Depression, unspecified: Secondary | ICD-10-CM

## 2023-11-09 DIAGNOSIS — T50902A Poisoning by unspecified drugs, medicaments and biological substances, intentional self-harm, initial encounter: Secondary | ICD-10-CM | POA: Diagnosis present

## 2023-11-09 DIAGNOSIS — T1491XA Suicide attempt, initial encounter: Secondary | ICD-10-CM

## 2023-11-09 DIAGNOSIS — F1010 Alcohol abuse, uncomplicated: Secondary | ICD-10-CM

## 2023-11-09 DIAGNOSIS — F332 Major depressive disorder, recurrent severe without psychotic features: Principal | ICD-10-CM

## 2023-11-09 LAB — COMPREHENSIVE METABOLIC PANEL
ALT: 14 U/L (ref 0–44)
AST: 28 U/L (ref 15–41)
Albumin: 4.1 g/dL (ref 3.5–5.0)
Alkaline Phosphatase: 69 U/L (ref 38–126)
Anion gap: 11 (ref 5–15)
BUN: 7 mg/dL (ref 6–20)
CO2: 22 mmol/L (ref 22–32)
Calcium: 8.8 mg/dL — ABNORMAL LOW (ref 8.9–10.3)
Chloride: 103 mmol/L (ref 98–111)
Creatinine, Ser: 0.7 mg/dL (ref 0.44–1.00)
GFR, Estimated: >60 mL/min (ref >60)
Glucose, Bld: 135 mg/dL — ABNORMAL HIGH (ref 70–99)
Potassium: 3.3 mmol/L — ABNORMAL LOW (ref 3.5–5.1)
Sodium: 136 mmol/L (ref 135–145)
Total Bilirubin: 0.5 mg/dL (ref 0.0–1.2)
Total Protein: 7.5 g/dL (ref 6.5–8.1)

## 2023-11-09 LAB — CBC WITH DIFFERENTIAL/PLATELET
Abs Immature Granulocytes: 0.03 10*3/uL (ref 0.00–0.07)
Basophils Absolute: 0 10*3/uL (ref 0.0–0.1)
Basophils Relative: 0 %
Eosinophils Absolute: 0.1 10*3/uL (ref 0.0–0.5)
Eosinophils Relative: 1 %
HCT: 41.5 % (ref 36.0–46.0)
Hemoglobin: 13.9 g/dL (ref 12.0–15.0)
Immature Granulocytes: 0 %
Lymphocytes Relative: 26 %
Lymphs Abs: 2.4 10*3/uL (ref 0.7–4.0)
MCH: 32.6 pg (ref 26.0–34.0)
MCHC: 33.5 g/dL (ref 30.0–36.0)
MCV: 97.2 fL (ref 80.0–100.0)
Monocytes Absolute: 0.6 10*3/uL (ref 0.1–1.0)
Monocytes Relative: 7 %
Neutro Abs: 6 10*3/uL (ref 1.7–7.7)
Neutrophils Relative %: 66 %
Platelets: 288 10*3/uL (ref 150–400)
RBC: 4.27 MIL/uL (ref 3.87–5.11)
RDW: 12.6 % (ref 11.5–15.5)
WBC: 9.1 10*3/uL (ref 4.0–10.5)
nRBC: 0 % (ref 0.0–0.2)

## 2023-11-09 LAB — PREGNANCY, URINE: Preg Test, Ur: NEGATIVE

## 2023-11-09 LAB — HIV ANTIBODY (ROUTINE TESTING W REFLEX): HIV Screen 4th Generation wRfx: NONREACTIVE

## 2023-11-09 LAB — TSH: TSH: 1.455 u[IU]/mL (ref 0.350–4.500)

## 2023-11-09 MED ORDER — TRAZODONE HCL 100 MG PO TABS
50.0000 mg | ORAL_TABLET | Freq: Every day | ORAL | Status: DC
  Administered 2023-11-09: 50 mg via ORAL
  Filled 2023-11-09: qty 1

## 2023-11-09 MED ORDER — POTASSIUM CHLORIDE 20 MEQ PO PACK
40.0000 meq | PACK | Freq: Once | ORAL | Status: AC
  Administered 2023-11-09: 40 meq via ORAL
  Filled 2023-11-09: qty 2

## 2023-11-09 MED ORDER — BUSPIRONE HCL 10 MG PO TABS
5.0000 mg | ORAL_TABLET | Freq: Three times a day (TID) | ORAL | Status: DC
  Administered 2023-11-09 (×3): 5 mg via ORAL
  Filled 2023-11-09 (×3): qty 1

## 2023-11-09 MED ORDER — ACETAMINOPHEN 325 MG PO TABS
650.0000 mg | ORAL_TABLET | Freq: Four times a day (QID) | ORAL | Status: DC | PRN
  Administered 2023-11-09 – 2023-11-10 (×3): 650 mg via ORAL
  Filled 2023-11-09 (×4): qty 2

## 2023-11-09 MED ORDER — ENOXAPARIN SODIUM 40 MG/0.4ML IJ SOSY
40.0000 mg | PREFILLED_SYRINGE | INTRAMUSCULAR | Status: DC
  Administered 2023-11-09: 40 mg via SUBCUTANEOUS
  Filled 2023-11-09: qty 0.4

## 2023-11-09 MED ORDER — NICOTINE 14 MG/24HR TD PT24
14.0000 mg | MEDICATED_PATCH | Freq: Every day | TRANSDERMAL | Status: DC
  Administered 2023-11-09: 14 mg via TRANSDERMAL
  Filled 2023-11-09: qty 1

## 2023-11-09 MED ORDER — LORAZEPAM 2 MG/ML IJ SOLN: 1.0000 mg | INTRAMUSCULAR | Status: DC | PRN

## 2023-11-09 MED ORDER — FOLIC ACID 1 MG PO TABS
1.0000 mg | ORAL_TABLET | Freq: Every day | ORAL | Status: DC
  Administered 2023-11-09: 1 mg via ORAL
  Filled 2023-11-09: qty 1

## 2023-11-09 MED ORDER — ASPIRIN 81 MG PO TBEC
81.0000 mg | DELAYED_RELEASE_TABLET | Freq: Every day | ORAL | Status: DC
  Administered 2023-11-09: 81 mg via ORAL
  Filled 2023-11-09: qty 1

## 2023-11-09 MED ORDER — FOLIC ACID 1 MG PO TABS: 1.0000 mg | ORAL_TABLET | Freq: Every day | ORAL | Status: DC

## 2023-11-09 MED ORDER — OXYCODONE HCL 5 MG PO TABS
5.0000 mg | ORAL_TABLET | Freq: Three times a day (TID) | ORAL | Status: DC | PRN
  Administered 2023-11-09 – 2023-11-10 (×4): 5 mg via ORAL
  Filled 2023-11-09 (×4): qty 1

## 2023-11-09 MED ORDER — ROPINIROLE HCL 1 MG PO TABS
2.0000 mg | ORAL_TABLET | Freq: Every day | ORAL | Status: DC
  Administered 2023-11-09: 2 mg via ORAL
  Filled 2023-11-09: qty 2

## 2023-11-09 MED ORDER — LORAZEPAM 1 MG PO TABS
1.0000 mg | ORAL_TABLET | ORAL | Status: DC | PRN
  Administered 2023-11-09 (×2): 1 mg via ORAL
  Filled 2023-11-09: qty 2
  Filled 2023-11-09: qty 1

## 2023-11-09 MED ORDER — SERTRALINE HCL 50 MG PO TABS: 50.0000 mg | ORAL_TABLET | Freq: Every day | ORAL | Status: DC

## 2023-11-09 MED ORDER — VITAMIN B-1 100 MG PO TABS: 100.0000 mg | ORAL_TABLET | Freq: Every day | ORAL | Status: DC

## 2023-11-09 MED ORDER — CLOPIDOGREL BISULFATE 75 MG PO TABS
75.0000 mg | ORAL_TABLET | Freq: Every day | ORAL | Status: DC
  Administered 2023-11-09: 75 mg via ORAL
  Filled 2023-11-09: qty 1

## 2023-11-09 MED ORDER — HYDROXYZINE HCL 25 MG PO TABS
25.0000 mg | ORAL_TABLET | Freq: Three times a day (TID) | ORAL | Status: DC | PRN
  Administered 2023-11-09 – 2023-11-10 (×2): 25 mg via ORAL
  Filled 2023-11-09 (×2): qty 1

## 2023-11-09 MED ORDER — CARMEX CLASSIC LIP BALM EX OINT
TOPICAL_OINTMENT | Freq: Once | CUTANEOUS | Status: AC
  Filled 2023-11-09: qty 10

## 2023-11-09 MED ORDER — HYDROXYZINE HCL 25 MG PO TABS
25.0000 mg | ORAL_TABLET | ORAL | Status: DC | PRN
  Administered 2023-11-09: 25 mg via ORAL
  Filled 2023-11-09: qty 1

## 2023-11-09 MED ORDER — LORAZEPAM 1 MG PO TABS
1.0000 mg | ORAL_TABLET | ORAL | Status: DC | PRN
Start: 2023-11-09 — End: 2023-11-15

## 2023-11-09 MED ORDER — THIAMINE MONONITRATE 100 MG PO TABS
100.0000 mg | ORAL_TABLET | Freq: Every day | ORAL | Status: DC
  Administered 2023-11-09: 100 mg via ORAL
  Filled 2023-11-09: qty 1

## 2023-11-09 MED ORDER — LAMOTRIGINE 100 MG PO TABS
200.0000 mg | ORAL_TABLET | Freq: Every day | ORAL | Status: DC
  Administered 2023-11-09: 200 mg via ORAL
  Filled 2023-11-09: qty 2

## 2023-11-09 MED ORDER — HYDROXYZINE HCL 25 MG PO TABS
25.0000 mg | ORAL_TABLET | Freq: Three times a day (TID) | ORAL | Status: DC | PRN
Start: 2023-11-09 — End: 2023-11-09

## 2023-11-09 MED ORDER — GABAPENTIN 300 MG PO CAPS
600.0000 mg | ORAL_CAPSULE | Freq: Three times a day (TID) | ORAL | Status: DC
Start: 2023-11-09 — End: 2023-11-10
  Administered 2023-11-09 (×3): 600 mg via ORAL
  Filled 2023-11-09 (×3): qty 2

## 2023-11-09 MED ORDER — ADULT MULTIVITAMIN W/MINERALS CH
1.0000 | ORAL_TABLET | Freq: Every day | ORAL | Status: DC
  Administered 2023-11-09: 1 via ORAL
  Filled 2023-11-09: qty 1

## 2023-11-09 MED ORDER — SERTRALINE HCL 50 MG PO TABS
50.0000 mg | ORAL_TABLET | Freq: Every day | ORAL | Status: DC
  Administered 2023-11-09: 50 mg via ORAL
  Filled 2023-11-09: qty 1

## 2023-11-09 MED ORDER — THIAMINE HCL 100 MG/ML IJ SOLN: 100.0000 mg | Freq: Every day | INTRAMUSCULAR | Status: DC

## 2023-11-09 NOTE — ED Notes (Signed)
 Called the [facility]'s office for transport.

## 2023-11-09 NOTE — ED Notes (Signed)
 Pt requesting pain med for leg pain 7/10, this RN informed pt it was too early and will administer med at 1200

## 2023-11-09 NOTE — ED Notes (Signed)
 Patient resting in bed breathing eyes closed

## 2023-11-09 NOTE — Progress Notes (Signed)
 LCSW Progress Note:   [id number]  [name] 11/09/2023 9:17 PM   Per [facility] Rona Ravens, RN, Patient is accepted to [facility] under IVC room 406-1. Accepting is Jospehine Onuoha NP. Attending is Massengill MD. Dx MDD, Intentional OD.

## 2023-11-09 NOTE — ED Notes (Addendum)
 Pt allowed to make 1/3 phone calls

## 2023-11-09 NOTE — ED Notes (Signed)
 Pt. States it is okay to talk to her husband and release information to him.
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
## 2023-11-09 NOTE — ED Notes (Signed)
 Attempted to call report, [facility] requested to call back in 15 mins.

## 2023-11-09 NOTE — ED Notes (Signed)
 Called to [phone] to transport via [employer] - spoke to [place].

## 2023-11-09 NOTE — ED Notes (Signed)
 Patient IV readjusted due to her reporting discomfort.

## 2023-11-09 NOTE — ED Notes (Signed)
 [name] called to check on pt. He stated he is her husband. Due to HIPpA  he is aware we cannot reveal information. He is also not listed in her chart as an Emergency contact. He would like an update from the doctor or psych, if appropriate his number is [phone] and is working in [employer] where reception is poor.

## 2023-11-09 NOTE — ED Notes (Signed)
 This patient now has an updated HIPAA release to involve husband in care. Ok to speak with and contact Shirlee Latch regarding pt's care

## 2023-11-09 NOTE — ED Notes (Signed)
 Patient requesting tylenol at this time stating "my knees are hurting me". Provider aware.
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
## 2023-11-09 NOTE — ED Notes (Signed)
 Pt allowed to make phone calls - 2/3 used

## 2023-11-09 NOTE — ED Notes (Addendum)
 Pt reiterated to TCU provider to not release information to spouse, Guenevere Roorda  - Profile updated

## 2023-11-09 NOTE — ED Notes (Signed)
 Patient refused labs at this time.

## 2023-11-09 NOTE — ED Notes (Signed)
 [place] at call center called back to inform this RN that sheriff was the person to transport
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
## 2023-11-09 NOTE — Progress Notes (Signed)
 Patient is accepted to [facility] under IVC room 406-1. Accepting is Jospehine Onuoha NP. Attending is Massengill MD. Dx MDD, Intentional OD.

## 2023-11-10 ENCOUNTER — Other Ambulatory Visit: Payer: Self-pay

## 2023-11-10 ENCOUNTER — Inpatient Hospital Stay (HOSPITAL_COMMUNITY)
Admission: AD | Admit: 2023-11-10 | Discharge: 2023-11-15 | DRG: 885 | Disposition: A | Discharge status: Home / Self Care | Payer: MEDICAID | Source: Intra-hospital | Attending: Psychiatry | Admitting: Psychiatry

## 2023-11-10 ENCOUNTER — Encounter (HOSPITAL_COMMUNITY): Payer: Self-pay | Admitting: Nurse Practitioner

## 2023-11-10 DIAGNOSIS — T426X2A Poisoning by other antiepileptic and sedative-hypnotic drugs, intentional self-harm, initial encounter: Secondary | ICD-10-CM | POA: Diagnosis present

## 2023-11-10 DIAGNOSIS — F1994 Other psychoactive substance use, unspecified with psychoactive substance-induced mood disorder: Secondary | ICD-10-CM | POA: Diagnosis present

## 2023-11-10 DIAGNOSIS — Z7982 Long term (current) use of aspirin: Secondary | ICD-10-CM | POA: Diagnosis not present

## 2023-11-10 DIAGNOSIS — F10129 Alcohol abuse with intoxication, unspecified: Secondary | ICD-10-CM | POA: Diagnosis present

## 2023-11-10 DIAGNOSIS — G4700 Insomnia, unspecified: Secondary | ICD-10-CM | POA: Diagnosis present

## 2023-11-10 DIAGNOSIS — R45851 Suicidal ideations: Secondary | ICD-10-CM | POA: Diagnosis not present

## 2023-11-10 DIAGNOSIS — F411 Generalized anxiety disorder: Secondary | ICD-10-CM | POA: Diagnosis present

## 2023-11-10 DIAGNOSIS — Z79899 Other long term (current) drug therapy: Secondary | ICD-10-CM

## 2023-11-10 DIAGNOSIS — F603 Borderline personality disorder: Secondary | ICD-10-CM | POA: Diagnosis present

## 2023-11-10 DIAGNOSIS — Z7902 Long term (current) use of antithrombotics/antiplatelets: Secondary | ICD-10-CM

## 2023-11-10 DIAGNOSIS — M2550 Pain in unspecified joint: Secondary | ICD-10-CM

## 2023-11-10 DIAGNOSIS — F332 Major depressive disorder, recurrent severe without psychotic features: Secondary | ICD-10-CM | POA: Diagnosis present

## 2023-11-10 DIAGNOSIS — F4310 Post-traumatic stress disorder, unspecified: Secondary | ICD-10-CM | POA: Diagnosis present

## 2023-11-10 DIAGNOSIS — Z791 Long term (current) use of non-steroidal anti-inflammatories (NSAID): Secondary | ICD-10-CM | POA: Diagnosis not present

## 2023-11-10 DIAGNOSIS — F17210 Nicotine dependence, cigarettes, uncomplicated: Secondary | ICD-10-CM | POA: Diagnosis present

## 2023-11-10 DIAGNOSIS — Z560 Unemployment, unspecified: Secondary | ICD-10-CM | POA: Diagnosis not present

## 2023-11-10 DIAGNOSIS — F319 Bipolar disorder, unspecified: Principal | ICD-10-CM | POA: Diagnosis present

## 2023-11-10 DIAGNOSIS — G8929 Other chronic pain: Secondary | ICD-10-CM | POA: Diagnosis present

## 2023-11-10 DIAGNOSIS — F3181 Bipolar II disorder: Secondary | ICD-10-CM | POA: Diagnosis present

## 2023-11-10 MED ORDER — LORAZEPAM 1 MG PO TABS
1.0000 mg | ORAL_TABLET | ORAL | Status: AC | PRN
Start: 2023-11-10 — End: 2023-11-12

## 2023-11-10 MED ORDER — LORAZEPAM 2 MG/ML IJ SOLN
2.0000 mg | Freq: Three times a day (TID) | INTRAMUSCULAR | Status: DC | PRN
  Administered 2023-11-11: 2 mg via INTRAMUSCULAR
  Filled 2023-11-10: qty 1

## 2023-11-10 MED ORDER — LAMOTRIGINE 100 MG PO TABS
200.0000 mg | ORAL_TABLET | Freq: Every day | ORAL | Status: DC
  Administered 2023-11-10 – 2023-11-15 (×6): 200 mg via ORAL
  Filled 2023-11-10 (×4): qty 1
  Filled 2023-11-10: qty 2
  Filled 2023-11-10 (×2): qty 1
  Filled 2023-11-10: qty 14
  Filled 2023-11-10: qty 1

## 2023-11-10 MED ORDER — CLOPIDOGREL BISULFATE 75 MG PO TABS
75.0000 mg | ORAL_TABLET | Freq: Every day | ORAL | Status: DC
  Administered 2023-11-10 – 2023-11-15 (×6): 75 mg via ORAL
  Filled 2023-11-10 (×2): qty 1
  Filled 2023-11-10: qty 5
  Filled 2023-11-10 (×7): qty 1

## 2023-11-10 MED ORDER — ADULT MULTIVITAMIN W/MINERALS CH
1.0000 | ORAL_TABLET | Freq: Every day | ORAL | Status: DC
  Administered 2023-11-10 – 2023-11-15 (×6): 1 via ORAL
  Filled 2023-11-10 (×9): qty 1

## 2023-11-10 MED ORDER — OXYCODONE HCL 5 MG PO TABS
5.0000 mg | ORAL_TABLET | Freq: Three times a day (TID) | ORAL | Status: DC | PRN
  Administered 2023-11-10 – 2023-11-15 (×16): 5 mg via ORAL
  Filled 2023-11-10 (×16): qty 1

## 2023-11-10 MED ORDER — HYDROXYZINE HCL 25 MG PO TABS
25.0000 mg | ORAL_TABLET | Freq: Three times a day (TID) | ORAL | Status: DC | PRN
  Administered 2023-11-10 – 2023-11-15 (×13): 25 mg via ORAL
  Filled 2023-11-10 (×11): qty 1
  Filled 2023-11-10: qty 10
  Filled 2023-11-10 (×3): qty 1

## 2023-11-10 MED ORDER — LORAZEPAM 2 MG/ML IJ SOLN: 1.0000 mg | INTRAMUSCULAR | Status: DC | PRN

## 2023-11-10 MED ORDER — NICOTINE 14 MG/24HR TD PT24
14.0000 mg | MEDICATED_PATCH | Freq: Every day | TRANSDERMAL | Status: DC
  Filled 2023-11-10 (×3): qty 1

## 2023-11-10 MED ORDER — NICOTINE 21 MG/24HR TD PT24
21.0000 mg | MEDICATED_PATCH | Freq: Every day | TRANSDERMAL | Status: DC
  Administered 2023-11-10 – 2023-11-15 (×6): 21 mg via TRANSDERMAL
  Filled 2023-11-10 (×8): qty 1

## 2023-11-10 MED ORDER — THIAMINE HCL 100 MG/ML IJ SOLN: 100.0000 mg | Freq: Every day | INTRAMUSCULAR | Status: DC

## 2023-11-10 MED ORDER — BUSPIRONE HCL 10 MG PO TABS
10.0000 mg | ORAL_TABLET | Freq: Three times a day (TID) | ORAL | Status: DC
  Administered 2023-11-10 – 2023-11-15 (×16): 10 mg via ORAL
  Filled 2023-11-10 (×7): qty 1
  Filled 2023-11-10: qty 15
  Filled 2023-11-10: qty 1
  Filled 2023-11-10: qty 15
  Filled 2023-11-10 (×2): qty 1
  Filled 2023-11-10: qty 15
  Filled 2023-11-10 (×7): qty 1

## 2023-11-10 MED ORDER — VITAMIN B-1 100 MG PO TABS
100.0000 mg | ORAL_TABLET | Freq: Every day | ORAL | Status: DC
  Administered 2023-11-10 – 2023-11-15 (×6): 100 mg via ORAL
  Filled 2023-11-10 (×9): qty 1

## 2023-11-10 MED ORDER — ALUM & MAG HYDROXIDE-SIMETH 200-200-20 MG/5ML PO SUSP: 30.0000 mL | ORAL | Status: DC | PRN

## 2023-11-10 MED ORDER — DIPHENHYDRAMINE HCL 50 MG/ML IJ SOLN
50.0000 mg | Freq: Three times a day (TID) | INTRAMUSCULAR | Status: DC | PRN
  Administered 2023-11-11: 50 mg via INTRAMUSCULAR
  Filled 2023-11-10: qty 1

## 2023-11-10 MED ORDER — LORAZEPAM 2 MG/ML IJ SOLN: 2.0000 mg | Freq: Three times a day (TID) | INTRAMUSCULAR | Status: DC | PRN

## 2023-11-10 MED ORDER — ACETAMINOPHEN 325 MG PO TABS
650.0000 mg | ORAL_TABLET | Freq: Four times a day (QID) | ORAL | Status: DC | PRN
  Administered 2023-11-10 – 2023-11-15 (×12): 650 mg via ORAL
  Filled 2023-11-10 (×13): qty 2

## 2023-11-10 MED ORDER — TRAZODONE HCL 50 MG PO TABS
50.0000 mg | ORAL_TABLET | Freq: Every day | ORAL | Status: DC
  Administered 2023-11-10 – 2023-11-14 (×5): 50 mg via ORAL
  Filled 2023-11-10 (×7): qty 1
  Filled 2023-11-10: qty 5

## 2023-11-10 MED ORDER — DIPHENHYDRAMINE HCL 25 MG PO CAPS
50.0000 mg | ORAL_CAPSULE | Freq: Three times a day (TID) | ORAL | Status: DC | PRN
  Administered 2023-11-12: 50 mg via ORAL
  Filled 2023-11-10: qty 2

## 2023-11-10 MED ORDER — DIPHENHYDRAMINE HCL 50 MG/ML IJ SOLN: 50.0000 mg | Freq: Three times a day (TID) | INTRAMUSCULAR | Status: DC | PRN

## 2023-11-10 MED ORDER — NICOTINE 21 MG/24HR TD PT24
21.0000 mg | MEDICATED_PATCH | Freq: Every day | TRANSDERMAL | Status: DC
  Filled 2023-11-10 (×2): qty 1

## 2023-11-10 MED ORDER — HALOPERIDOL LACTATE 5 MG/ML IJ SOLN
5.0000 mg | Freq: Three times a day (TID) | INTRAMUSCULAR | Status: DC | PRN
  Administered 2023-11-11: 5 mg via INTRAMUSCULAR
  Filled 2023-11-10: qty 1

## 2023-11-10 MED ORDER — ENOXAPARIN SODIUM 40 MG/0.4ML IJ SOSY
40.0000 mg | PREFILLED_SYRINGE | INTRAMUSCULAR | Status: DC
  Administered 2023-11-10 – 2023-11-14 (×5): 40 mg via SUBCUTANEOUS
  Filled 2023-11-10 (×8): qty 0.4

## 2023-11-10 MED ORDER — SERTRALINE HCL 50 MG PO TABS
50.0000 mg | ORAL_TABLET | Freq: Every day | ORAL | Status: DC
  Administered 2023-11-10 – 2023-11-15 (×6): 50 mg via ORAL
  Filled 2023-11-10 (×5): qty 1
  Filled 2023-11-10: qty 5
  Filled 2023-11-10 (×4): qty 1

## 2023-11-10 MED ORDER — HALOPERIDOL 5 MG PO TABS
5.0000 mg | ORAL_TABLET | Freq: Three times a day (TID) | ORAL | Status: DC | PRN
  Filled 2023-11-10: qty 1

## 2023-11-10 MED ORDER — GABAPENTIN 300 MG PO CAPS
600.0000 mg | ORAL_CAPSULE | Freq: Three times a day (TID) | ORAL | Status: DC
  Administered 2023-11-10 – 2023-11-15 (×17): 600 mg via ORAL
  Filled 2023-11-10 (×16): qty 2
  Filled 2023-11-10 (×2): qty 30
  Filled 2023-11-10 (×3): qty 2
  Filled 2023-11-10: qty 30

## 2023-11-10 MED ORDER — ROPINIROLE HCL 1 MG PO TABS
2.0000 mg | ORAL_TABLET | Freq: Every day | ORAL | Status: DC
  Administered 2023-11-10 – 2023-11-11 (×2): 2 mg via ORAL
  Filled 2023-11-10 (×5): qty 2

## 2023-11-10 MED ORDER — ASPIRIN 81 MG PO TBEC
81.0000 mg | DELAYED_RELEASE_TABLET | Freq: Every day | ORAL | Status: DC
  Administered 2023-11-10 – 2023-11-15 (×6): 81 mg via ORAL
  Filled 2023-11-10: qty 1
  Filled 2023-11-10: qty 5
  Filled 2023-11-10 (×8): qty 1

## 2023-11-10 MED ORDER — BUSPIRONE HCL 5 MG PO TABS
5.0000 mg | ORAL_TABLET | Freq: Three times a day (TID) | ORAL | Status: DC
  Administered 2023-11-10: 5 mg via ORAL
  Filled 2023-11-10 (×7): qty 1

## 2023-11-10 MED ORDER — HALOPERIDOL LACTATE 5 MG/ML IJ SOLN: 10.0000 mg | Freq: Three times a day (TID) | INTRAMUSCULAR | Status: DC | PRN

## 2023-11-10 MED ORDER — FOLIC ACID 1 MG PO TABS
1.0000 mg | ORAL_TABLET | Freq: Every day | ORAL | Status: DC
  Administered 2023-11-10 – 2023-11-15 (×6): 1 mg via ORAL
  Filled 2023-11-10 (×2): qty 1
  Filled 2023-11-10: qty 5
  Filled 2023-11-10 (×6): qty 1

## 2023-11-10 NOTE — Plan of Care (Signed)
  Problem: Safety: Goal: Periods of time without injury will increase Outcome: Progressing   Problem: Medication: Goal: Compliance with prescribed medication regimen will improve Outcome: Progressing   Problem: Coping: Goal: Coping ability will improve Outcome: Not Progressing

## 2023-11-10 NOTE — [facility] Suicide Risk Assessment (Signed)
 [facility] Admission Suicide Risk Assessment   Nursing information obtained from:    Demographic factors:    Current Mental Status:    Loss Factors:    Historical Factors:    Risk Reduction Factors:     Total Time spent with patient: 30 minutes Principal Problem: Major depressive disorder, recurrent severe without psychotic features (HCC) Diagnosis:  Principal Problem:   Major depressive disorder, recurrent severe without psychotic features (HCC)  Subjective Data:  43 year old Caucasian female, married, unemployed, lives with her family.  Background history of bipolar disorder and substance use disorder.  Presented involuntarily following an impulsive overdose.  Overdose on a handful of gabapentin at home.  BAL at presentation was 106 mg/dl. Past history of multiple suicidal attempts  Continued Clinical Symptoms:    The "Alcohol Use Disorders Identification Test", Guidelines for Use in Primary Care, Second Edition.  World Science writer [facility]). Score between 0-7:  no or low risk or alcohol related problems. Score between 8-15:  moderate risk of alcohol related problems. Score between 16-19:  high risk of alcohol related problems. Score 20 or above:  warrants further diagnostic evaluation for alcohol dependence and treatment.   CLINICAL FACTORS:   Alcohol/Substance Abuse/Dependencies   Musculoskeletal: Strength & Muscle Tone: Unable to assess. Gait & Station: Mobilizes with a wheelchair. Patient leans: N/A  Psychiatric Specialty Exam:  Presentation  General Appearance:  In a wheelchair, tearful during the interview, not tremulous, normal conjugate eye movement.  Eye Contact: Fair  Speech: Spontaneous.  Not pressured or loud.  Mood and Affect  Mood: Dysphoric.  Affect: Restricted and mood congruent.  Thought Process  Thought Processes: Goal directed.  Descriptions of Associations:Intact  Orientation:Full (Time, Place and Person)  Thought Content: No  delusional theme.  No current suicidal thoughts.  No homicidal thoughts.  No thoughts of violence.  Guilty ruminations about relapsing on alcohol and having an accident.  Hallucinations: No hallucination in any modality.    Sensorium  Memory: Immediate Good  Judgment: Poor.  Insight: Fair   Chartered certified accountant: Fair  Attention Span: Fair  Recall: Good.  Fund of Knowledge: Good.  Language: Good.  Psychomotor Activity  Normal psychomotor activity.  Physical Exam: Physical Exam ROS There were no vitals taken for this visit. There is no height or weight on file to calculate BMI.   COGNITIVE FEATURES THAT CONTRIBUTE TO RISK:  Thought constriction (tunnel vision)    SUICIDE RISK:   Moderate:  Frequent suicidal ideation with limited intensity, and duration, some specificity in terms of plans, no associated intent, good self-control, limited dysphoria/symptomatology, some risk factors present, and identifiable protective factors, including available and accessible social support.  PLAN OF CARE:   Patient will be on every 15 minute checks for suicide.  We will supervise detox from alcohol.  We will continue pain management recommendations.   I certify that inpatient services furnished can reasonably be expected to improve the patient's condition.   Georgiann Cocker, MD 11/10/2023, 3:08 PM

## 2023-11-10 NOTE — Progress Notes (Signed)
   11/10/23 2300  Psych Admission Type (Psych Patients Only)  Admission Status Involuntary  Psychosocial Assessment  Patient Complaints Anger;Anxiety;Decreased concentration;Irritability  Eye Contact Fair  Facial Expression Animated;Anxious  Affect Anxious  Speech Argumentative;Rapid;Aggressive  Interaction Attention-seeking;Demanding;Hostile;Needy  Motor Activity Fidgety;Restless  Appearance/Hygiene Unremarkable  Behavior Characteristics Agressive verbally;Agitated;Anxious;Fidgety;Impulsive;Intrusive  Mood Labile  Thought Process  Coherency Circumstantial  Content Blaming others  Delusions None reported or observed  Perception WDL  Hallucination None reported or observed  Judgment WDL  Confusion None  Danger to Self  Current suicidal ideation? Denies  Danger to Others  Danger to Others None reported or observed

## 2023-11-10 NOTE — [facility] Group Notes (Signed)
 Spirituality Group Focus of discussion: Gratitude and Strength Awareness  Process: Following theoretical framework of group therapy of Chyrl Civatte and further informed by Rogerian and Relational Cultural Theory approaches, participants invited to name:  Sources of gratitude (internal>external) Articulate gratitude for self Name a personal strength/gift/skill Locate points of resonance among group members/engage the "here and now" Conclude with grounding/breathwork  Observations: [name] wass present for the first half of group. She was reserved and not clear if she was very engaged with the discussion.  [name] L. [name], M.Div [phone]

## 2023-11-10 NOTE — Group Note (Signed)
 Date:  11/10/2023 Time:  5:08 PM  Group Topic/Focus:  Dimensions of Wellness:   The focus of this group is to introduce the topic of wellness and discuss the role each dimension of wellness plays in total health.    Participation Level:  Did Not Attend  Participation Quality:   n/a  Affect:   n/a  Cognitive:   n/a  Insight: None  Engagement in Group:   n/a  Modes of Intervention:   n/a  Additional Comments:   Did not attend  Zaneta Lightcap M Bridgit Eynon 11/10/2023, 5:08 PM

## 2023-11-10 NOTE — Group Note (Signed)
 Date:  11/10/2023 Time:  4:36 PM  Group Topic/Focus:  Goals Group:   The focus of this group is to help patients establish daily goals to achieve during treatment and discuss how the patient can incorporate goal setting into their daily lives to aide in recovery. Orientation:   The focus of this group is to educate the patient on the purpose and policies of crisis stabilization and provide a format to answer questions about their admission.  The group details unit policies and expectations of patients while admitted.    Participation Level:  Did Not Attend  Participation Quality:   n/a  Affect:   n/a  Cognitive:   n/a  Insight: None  Engagement in Group:   n/a  Modes of Intervention:   n/a  Additional Comments:   Did not attend  Denario Bagot M Avia Merkley 11/10/2023, 4:36 PM
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
## 2023-11-10 NOTE — Plan of Care (Signed)
   Problem: Education: Goal: Knowledge of [facility] Education information/materials will improve Outcome: Progressing Goal: Emotional status will improve Outcome: Progressing Goal: Mental status will improve Outcome: Progressing Goal: Verbalization of understanding the information provided will improve Outcome: Progressing

## 2023-11-10 NOTE — [facility] Group Notes (Signed)
 [facility] Group Notes:  (Nursing/MHT/Case Management/Adjunct)  Date:  11/10/2023  Time:  8:36 PM  Type of Therapy:   Wrap-up group  Participation Level:  Active  Participation Quality:  Appropriate  Affect:  Appropriate  Cognitive:  Appropriate  Insight:  Appropriate  Engagement in Group:  Engaged  Modes of Intervention:  Education  Summary of Progress/Problems: Goal to manage pain. Not met. Rated her day 5/10.  Noah Delaine 11/10/2023, 8:36 PM
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
## 2023-11-11 ENCOUNTER — Encounter (HOSPITAL_COMMUNITY): Payer: Self-pay | Admitting: Nurse Practitioner

## 2023-11-11 DIAGNOSIS — F319 Bipolar disorder, unspecified: Secondary | ICD-10-CM | POA: Diagnosis not present

## 2023-11-11 MED ORDER — QUETIAPINE FUMARATE 50 MG PO TABS
50.0000 mg | ORAL_TABLET | Freq: Every day | ORAL | Status: DC
  Administered 2023-11-11: 50 mg via ORAL
  Filled 2023-11-11 (×5): qty 1

## 2023-11-11 MED ORDER — NICOTINE POLACRILEX 2 MG MT GUM: 2.0000 mg | CHEWING_GUM | OROMUCOSAL | Status: DC | PRN

## 2023-11-11 MED ORDER — IBUPROFEN 400 MG PO TABS
400.0000 mg | ORAL_TABLET | Freq: Four times a day (QID) | ORAL | Status: DC | PRN
  Administered 2023-11-11 – 2023-11-15 (×9): 400 mg via ORAL
  Filled 2023-11-11 (×10): qty 1

## 2023-11-11 NOTE — Plan of Care (Signed)
   Problem: Education: Goal: Knowledge of [facility] Education information/materials will improve Outcome: Progressing Goal: Emotional status will improve Outcome: Progressing Goal: Mental status will improve Outcome: Progressing Goal: Verbalization of understanding the information provided will improve Outcome: Progressing
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
## 2023-11-11 NOTE — Progress Notes (Addendum)
 PRN moderate agitation protocol administered at 1835. IM Haldol 5mg , Benadryl 50mg , Ativan 2mg  administered. Patient became agitated this evening due to her unresolved pain. Pt preoccupied on wanting to go to the ED for her pain. Pt's assigned nurse informed pt that the MD does not believe that she needs to be sent to the ED for her pain. Pt became loud and boisterous yelling at staff to "do our jobs" and "we don't make the decisions". Pt then demanded to speak to charge RN. Writer approached pt to discuss concern. Pt began screaming at writer demanding to go to the ED. Pt stated "If you don't send me to the ED I am going to act out". Pt returned to assigned room following medication administration and is observed laying in bed with no s/s of distress at this time.

## 2023-11-11 NOTE — Progress Notes (Signed)
   11/11/23 0900  Psych Admission Type (Psych Patients Only)  Admission Status Involuntary  Psychosocial Assessment  Patient Complaints Anger;Anxiety;Crying spells;Irritability  Eye Contact Fair  Facial Expression Anxious;Worried  Affect Anxious  Speech Aggressive;Argumentative  Actuary Activity Restless  Appearance/Hygiene Unremarkable  Behavior Characteristics Agitated;Irritable  Mood Labile  Thought Process  Coherency Circumstantial  Content Blaming others  Delusions None reported or observed  Perception WDL  Hallucination None reported or observed  Judgment WDL  Confusion None  Danger to Self  Current suicidal ideation? Denies  Agreement Not to Harm Self Yes  Description of Agreement verba  Danger to Others  Danger to Others None reported or observed

## 2023-11-11 NOTE — [facility] Group Notes (Signed)
 Psychoeducational Group Note  Date:  11/11/2023 Time:  2000  Group Topic/Focus:  Wrap up group  Participation Level: Did Not Attend  Participation Quality:  Not Applicable  Affect:  Not Applicable  Cognitive:  Not Applicable  Insight:  Not Applicable  Engagement in Group: Not Applicable  Additional Comments:  Did not attend.   Marcille Buffy 11/11/2023, 8:49 PM
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
## 2023-11-11 NOTE — Progress Notes (Signed)
 Patient came to the nurses station screaming asking go to the ER because she needs "more pain medication." She spoke with MD earlier today, pt was ordered ibuprofen for pain, patient was medicated but states nothing we are giving her is enough. Md messaged, awaiting reply. Patient stopped screaming after speaking with charge nurse, she is in the hallway sitting in her wheelchair quietly.

## 2023-11-11 NOTE — Group Note (Signed)
 LCSW Group Therapy Note   Group Date: 11/11/2023 Start Time: 1100 End Time: 1200   Participation:  patient was present  Type of Therapy:  Group Therapy  Topic:  Stronger Together: Building Healthy Relationships  Objective:  To explore loneliness, boundaries, and safe ways to build relationships.  Goals: Recognize healthy vs. unhealthy relationships. Learn safe ways to connect with others. Strengthen communication and [employer].  Summary:  Participants discussed loneliness, healthy connections, and setting boundaries. They explored safe ways to meet people and shared personal experiences. Key insights were reinforced through discussion and quotes.  Therapeutic Modalities Used: Elements of Cognitive Behavioral Therapy (CBT) - Identifying unhealthy relationship patterns, challenging negative thoughts about connection. Elements of Dialectical Behavior Therapy (DBT) - Interpersonal effectiveness, setting and maintaining boundaries. Supportive Group Therapy - Peer discussion, shared experiences, and emotional validation.   Alla Feeling, LCSWA 11/11/2023  1:55 PM

## 2023-11-12 ENCOUNTER — Encounter (HOSPITAL_COMMUNITY): Payer: Self-pay

## 2023-11-12 DIAGNOSIS — F319 Bipolar disorder, unspecified: Secondary | ICD-10-CM | POA: Diagnosis not present

## 2023-11-12 MED ORDER — ROPINIROLE HCL 1 MG PO TABS
2.0000 mg | ORAL_TABLET | Freq: Every day | ORAL | Status: DC
  Administered 2023-11-12 – 2023-11-14 (×3): 2 mg via ORAL
  Filled 2023-11-12 (×4): qty 2
  Filled 2023-11-12: qty 10
  Filled 2023-11-12: qty 2

## 2023-11-12 MED ORDER — QUETIAPINE FUMARATE 100 MG PO TABS
100.0000 mg | ORAL_TABLET | Freq: Every day | ORAL | Status: DC
  Administered 2023-11-12: 100 mg via ORAL
  Filled 2023-11-12 (×4): qty 1

## 2023-11-12 NOTE — Plan of Care (Signed)
   Problem: Education: Goal: Knowledge of [facility] Education information/materials will improve Outcome: Progressing Goal: Emotional status will improve Outcome: Progressing Goal: Mental status will improve Outcome: Progressing Goal: Verbalization of understanding the information provided will improve Outcome: Progressing

## 2023-11-12 NOTE — Plan of Care (Signed)
  Problem: Education: Goal: Mental status will improve Outcome: Progressing   Problem: Activity: Goal: Sleeping patterns will improve Outcome: Progressing   Problem: Safety: Goal: Periods of time without injury will increase Outcome: Progressing   Problem: Safety: Goal: Periods of time without injury will increase Outcome: Progressing

## 2023-11-12 NOTE — [facility] Group Notes (Signed)
 Adult Psychoeducational Group Note  Date:  11/12/2023 Time:  9:54 PM  Group Topic/Focus:  Wrap-Up Group:   The focus of this group is to help patients review their daily goal of treatment and discuss progress on daily workbooks.  Participation Level:  Active  Participation Quality:  Appropriate  Affect:  Appropriate  Cognitive:  Appropriate  Insight: Appropriate  Engagement in Group:  Engaged  Modes of Intervention:  Discussion and Support  Additional Comments:  Pt attended the evening AA meeting.  Christ Kick 11/12/2023, 9:54 PM

## 2023-11-12 NOTE — Progress Notes (Signed)
   11/11/23 2300  Psych Admission Type (Psych Patients Only)  Admission Status Involuntary  Psychosocial Assessment  Patient Complaints Anger;Anxiety;Irritability  Eye Contact Fair  Facial Expression Anxious;Worried  Affect Anxious;Irritable  Editor, commissioning Activity Restless  Appearance/Hygiene Unremarkable  Behavior Characteristics Irritable  Mood Labile  Thought Process  Coherency Circumstantial  Content Blaming others  Delusions None reported or observed  Perception WDL  Hallucination None reported or observed  Judgment WDL  Confusion None  Danger to Self  Current suicidal ideation? Denies  Agreement Not to Harm Self Yes  Description of Agreement verbal  Danger to Others  Danger to Others None reported or observed
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
## 2023-11-12 NOTE — Group Note (Signed)
 Recreation Therapy Group Note   Group Topic:Stress Management  Group Date: 11/12/2023 Start Time: 1478 End Time: 1002 Facilitators: Brayla Pat-McCall, LRT,CTRS Location: 300 Hall Dayroom   Group Topic: Stress Management  Goal Area(s) Addresses:  Patient will identify positive stress management techniques. Patient will identify benefits of using stress management post d/c.  Intervention: Insight Timer App  Activity: LRT played a meditation that focused on not holding yourself back. Patients were to visualize what they saw for their future selves. Patients were to then acknowledge the things they feel are holding them back, accept them and work through them to be person they want themselves to be. LRT also explained to patients they could find more meditations on Youtube, apps, scripts, etc.    Education:  Stress Management, Discharge Planning.   Education Outcome: Acknowledges Education   Affect/Mood: N/A   Participation Level: Did not attend    Clinical Observations/Individualized Feedback:      Plan: Continue to engage patient in RT group sessions 2-3x/week.   Chelesa Weingartner-McCall, LRT,CTRS 11/12/2023 12:34 PM

## 2023-11-12 NOTE — Progress Notes (Signed)
   11/12/23 0800  Psych Admission Type (Psych Patients Only)  Admission Status Involuntary  Psychosocial Assessment  Patient Complaints Anger;Anxiety;Agitation;Irritability  Eye Contact Fair  Facial Expression Anxious;Worried  Affect Anxious;Irritable  Editor, commissioning Activity Restless  Appearance/Hygiene Unremarkable  Behavior Characteristics Agitated;Anxious  Mood Anxious  Thought Process  Coherency Circumstantial  Content Blaming others  Delusions None reported or observed  Perception WDL  Hallucination None reported or observed  Judgment WDL  Confusion None  Danger to Self  Current suicidal ideation? Denies  Agreement Not to Harm Self Yes  Description of Agreement Verbal  Danger to Others  Danger to Others None reported or observed
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
## 2023-11-12 NOTE — BH IP Treatment Plan (Signed)
 Interdisciplinary Treatment and Diagnostic Plan Update  11/12/2023 Time of Session: 10:55 AM KIMIMILA TAUZIN MRN: [id number]  Principal Diagnosis: Bipolar I disorder (HCC)  Secondary Diagnoses: Principal Problem:   Bipolar I disorder (HCC)   Current Medications:  Current Facility-Administered Medications  Medication Dose Route Frequency Provider Last Rate Last Admin   acetaminophen (TYLENOL) tablet 650 mg  650 mg Oral Q6H PRN Dahlia Byes C, NP   650 mg at 11/11/23 0742   alum & mag hydroxide-simeth (MAALOX/MYLANTA) 200-200-20 MG/5ML suspension 30 mL  30 mL Oral Q4H PRN Earney Navy, NP       aspirin EC tablet 81 mg  81 mg Oral Daily Dahlia Byes C, NP   81 mg at 11/12/23 0813   busPIRone (BUSPAR) tablet 10 mg  10 mg Oral TID Georgiann Cocker, MD   10 mg at 11/12/23 1138   clopidogrel (PLAVIX) tablet 75 mg  75 mg Oral Daily Dahlia Byes C, NP   75 mg at 11/12/23 0813   haloperidol (HALDOL) tablet 5 mg  5 mg Oral TID PRN Earney Navy, NP       And   diphenhydrAMINE (BENADRYL) capsule 50 mg  50 mg Oral TID PRN Dahlia Byes C, NP   50 mg at 11/12/23 1308   haloperidol lactate (HALDOL) injection 5 mg  5 mg Intramuscular TID PRN Dahlia Byes C, NP   5 mg at 11/11/23 1835   And   diphenhydrAMINE (BENADRYL) injection 50 mg  50 mg Intramuscular TID PRN Earney Navy, NP   50 mg at 11/11/23 6578   And   LORazepam (ATIVAN) injection 2 mg  2 mg Intramuscular TID PRN Dahlia Byes C, NP   2 mg at 11/11/23 1836   haloperidol lactate (HALDOL) injection 10 mg  10 mg Intramuscular TID PRN Earney Navy, NP       And   diphenhydrAMINE (BENADRYL) injection 50 mg  50 mg Intramuscular TID PRN Dahlia Byes C, NP       And   LORazepam (ATIVAN) injection 2 mg  2 mg Intramuscular TID PRN Dahlia Byes C, NP       enoxaparin (LOVENOX) injection 40 mg  40 mg Subcutaneous Q24H Onuoha, Josephine C, NP   40 mg at 11/12/23 1137   folic acid  (FOLVITE) tablet 1 mg  1 mg Oral Daily Onuoha, Josephine C, NP   1 mg at 11/12/23 0813   gabapentin (NEURONTIN) capsule 600 mg  600 mg Oral TID Dahlia Byes C, NP   600 mg at 11/12/23 1137   hydrOXYzine (ATARAX) tablet 25 mg  25 mg Oral TID PRN Dahlia Byes C, NP   25 mg at 11/12/23 1249   ibuprofen (ADVIL) tablet 400 mg  400 mg Oral Q6H PRN Georgiann Cocker, MD   400 mg at 11/11/23 1440   lamoTRIgine (LAMICTAL) tablet 200 mg  200 mg Oral Daily Dahlia Byes C, NP   200 mg at 11/12/23 4696   multivitamin with minerals tablet 1 tablet  1 tablet Oral Daily Dahlia Byes C, NP   1 tablet at 11/12/23 2952   nicotine (NICODERM CQ - dosed in mg/24 hours) patch 21 mg  21 mg Transdermal Daily Armandina Stammer I, NP   21 mg at 11/12/23 8413   nicotine polacrilex (NICORETTE) gum 2 mg  2 mg Oral PRN Massengill, Harrold Donath, MD       oxyCODONE (Oxy IR/ROXICODONE) immediate release tablet 5 mg  5 mg Oral Q8H  PRN Earney Navy, NP   5 mg at 11/12/23 6578   QUEtiapine (SEROQUEL) tablet 100 mg  100 mg Oral QHS Izediuno, Delight Ovens, MD       rOPINIRole (REQUIP) tablet 2 mg  2 mg Oral Q2000 Izediuno, Delight Ovens, MD       sertraline (ZOLOFT) tablet 50 mg  50 mg Oral Daily Onuoha, Josephine C, NP   50 mg at 11/12/23 4696   thiamine (Vitamin B-1) tablet 100 mg  100 mg Oral Daily Dahlia Byes C, NP   100 mg at 11/12/23 2952   traZODone (DESYREL) tablet 50 mg  50 mg Oral QHS Dahlia Byes C, NP   50 mg at 11/11/23 2122   Facility-Administered Medications Ordered in Other Encounters  Medication Dose Route Frequency Provider Last Rate Last Admin   naproxen (NAPROSYN) tablet 375 mg  375 mg Oral TID WC Verne Spurr T, PA-C       PTA Medications: Medications Prior to Admission  Medication Sig Dispense Refill Last Dose/Taking   acetaminophen (TYLENOL) 500 MG tablet Take 1,000 mg by mouth See admin instructions. Take 1,000 mg by mouth every 6-8 hours and cannot exceed a sum total of 4,000 mg/24  hours from ALL combined sources      aspirin EC 81 MG tablet Take 81 mg by mouth daily.      busPIRone (BUSPAR) 5 MG tablet Take 5 mg by mouth 3 (three) times daily.      clopidogrel (PLAVIX) 75 MG tablet Take 75 mg by mouth daily.      folic acid (FOLVITE) 1 MG tablet Take 1 tablet (1 mg total) by mouth daily.      gabapentin (NEURONTIN) 600 MG tablet Take 600 mg by mouth 3 (three) times daily.      lamoTRIgine (LAMICTAL) 200 MG tablet Take 200 mg by mouth in the morning.      lip balm (CARMEX) ointment Apply 1 Application topically as needed (for dry lips).      LORazepam (ATIVAN) 1 MG tablet Take 1-4 tablets (1-4 mg total) by mouth every hour as needed (withdrawal symptoms:  anxiety, agitation, insomnia, diaphoresis, nausea, vomiting, tremors, tachycardia, or hypertension.).      meloxicam (MOBIC) 15 MG tablet Take 1 tablet (15 mg total) by mouth daily. (Patient not taking: Reported on 11/09/2023) 30 tablet 1    naloxone (NARCAN) nasal spray 4 mg/0.1 mL Place 1 spray into the nose as needed (AS DIRECTED BY MD).      nicotine (NICODERM CQ - DOSED IN MG/24 HOURS) 14 mg/24hr patch Place 14 mg onto the skin daily.      nicotine (NICODERM CQ - DOSED IN MG/24 HOURS) 21 mg/24hr patch Place 1 patch (21 mg total) onto the skin daily. (Patient not taking: Reported on 11/09/2023) 28 patch 0    [EXPIRED] oxyCODONE (OXY IR/ROXICODONE) 5 MG immediate release tablet Take 5 mg by mouth every 8 (eight) hours as needed for severe pain (pain score 7-10).      REFRESH TEARS PF 0.5-0.9 % SOLN Place 1 drop into both eyes 4 (four) times daily as needed (for dryness).      rOPINIRole (REQUIP) 2 MG tablet Take 1 tablet (2 mg total) by mouth at bedtime. 30 tablet 0    sertraline (ZOLOFT) 50 MG tablet Take 1 tablet (50 mg total) by mouth daily.      thiamine (VITAMIN B-1) 100 MG tablet Take 1 tablet (100 mg total) by mouth daily.  traMADol HCl 100 MG TABS Take 100 mg by mouth every 6 (six) hours as needed (for pain).       traZODone (DESYREL) 100 MG tablet Take 100 mg by mouth at bedtime.      traZODone (DESYREL) 50 MG tablet Take 1 tablet (50 mg total) by mouth at bedtime. (Patient not taking: Reported on 11/09/2023) 30 tablet 0    Vitamin D, Ergocalciferol, (DRISDOL) 1.25 MG (50000 UNIT) CAPS capsule Take 1 capsule (50,000 Units total) by mouth every 7 (seven) days. (Patient not taking: Reported on 11/09/2023) 4 capsule 2     Patient Stressors: Financial difficulties   Occupational concerns   Substance abuse   Traumatic event    Patient Strengths: Capable of independent living  Supportive family/friends  Work skills   Treatment Modalities: Medication Management, Group therapy, Case management,  1 to 1 session with clinician, Psychoeducation, Recreational therapy.   Physician Treatment Plan for Primary Diagnosis: Bipolar I disorder (HCC) Long Term Goal(s): Improvement in symptoms so as ready for discharge   Short Term Goals: Ability to identify changes in lifestyle to reduce recurrence of condition will improve  Medication Management: Evaluate patient's response, side effects, and tolerance of medication regimen.  Therapeutic Interventions: 1 to 1 sessions, Unit Group sessions and Medication administration.  Evaluation of Outcomes: Not Progressing  Physician Treatment Plan for Secondary Diagnosis: Principal Problem:   Bipolar I disorder (HCC)  Long Term Goal(s): Improvement in symptoms so as ready for discharge   Short Term Goals: Ability to identify changes in lifestyle to reduce recurrence of condition will improve     Medication Management: Evaluate patient's response, side effects, and tolerance of medication regimen.  Therapeutic Interventions: 1 to 1 sessions, Unit Group sessions and Medication administration.  Evaluation of Outcomes: Not Progressing   RN Treatment Plan for Primary Diagnosis: Bipolar I disorder (HCC) Long Term Goal(s): Knowledge of disease and therapeutic regimen to  maintain health will improve  Short Term Goals: Ability to remain free from injury will improve, Ability to verbalize frustration and anger appropriately will improve, Ability to demonstrate self-control, Ability to participate in decision making will improve, Ability to verbalize feelings will improve, Ability to disclose and discuss suicidal ideas, Ability to identify and develop effective coping behaviors will improve, and Compliance with prescribed medications will improve  Medication Management: RN will administer medications as ordered by provider, will assess and evaluate patient's response and provide education to patient for prescribed medication. RN will report any adverse and/or side effects to prescribing provider.  Therapeutic Interventions: 1 on 1 counseling sessions, Psychoeducation, Medication administration, Evaluate responses to treatment, Monitor vital signs and CBGs as ordered, Perform/monitor CIWA, COWS, AIMS and Fall Risk screenings as ordered, Perform wound care treatments as ordered.  Evaluation of Outcomes: Not Progressing   LCSW Treatment Plan for Primary Diagnosis: Bipolar I disorder (HCC) Long Term Goal(s): Safe transition to appropriate next level of care at discharge, Engage patient in therapeutic group addressing interpersonal concerns.  Short Term Goals: Engage patient in aftercare planning with referrals and resources, Increase social support, Increase ability to appropriately verbalize feelings, Increase emotional regulation, Facilitate acceptance of mental health diagnosis and concerns, Facilitate patient progression through stages of change regarding substance use diagnoses and concerns, Identify triggers associated with mental health/substance abuse issues, and Increase skills for wellness and recovery  Therapeutic Interventions: Assess for all discharge needs, 1 to 1 time with Social worker, Explore available resources and support systems, Assess for adequacy in  community support network,  Educate family and significant other(s) on suicide prevention, Complete Psychosocial Assessment, Interpersonal group therapy.  Evaluation of Outcomes: Not Progressing   Progress in Treatment: Attending groups: Attended some groups Participating in groups: Yes. Taking medication as prescribed: Yes. Toleration medication: Yes. Family/Significant other contact made: No, will contact:  Brynne Doane (husband) [phone] Patient understands diagnosis: Yes. Discussing patient identified problems/goals with staff: Yes. Medical problems stabilized or resolved: Yes. Denies suicidal/homicidal ideation: Yes. Issues/concerns per patient self-inventory: No.  New problem(s) identified:  No  New Short Term/Long Term Goal(s):    medication stabilization, elimination of SI thoughts, development of comprehensive mental wellness plan.   Patient Goals:  "I want to get mentally stable."   Discharge Plan or Barriers:  Patient recently admitted. CSW will continue to follow and assess for appropriate referrals and possible discharge planning.    Reason for Continuation of Hospitalization: Depression Medication stabilization Suicidal ideation  Estimated Length of Stay:  5 - 7 days  Last 3 [place] Suicide Severity Risk Score: Flowsheet Row Ad[facility] (Current) [facility] from 11/08/2023 in [facility] at [facility] ED from 09/02/2023 in [facility] Emergency Department at [facility]  C-SSRS RISK CATEGORY High Risk No Risk No Risk       Last PHQ 2/9 Scores:    11/20/2022    9:00 AM 06/02/2017    1:59 PM 02/24/2016    2:54 PM  Depression screen PHQ 2/9  Decreased Interest 3 0 0  Down, Depressed, Hopeless 3 0 0  PHQ - 2 Score 6 0 0  Altered sleeping 3    Tired, decreased energy 3    Change in appetite 3    Feeling bad or failure about yourself  3    Trouble concentrating 3     Moving slowly or fidgety/restless 3    Suicidal thoughts 3    PHQ-9 Score 27    Difficult doing work/chores Very difficult      Scribe for Treatment Team: Alla Feeling, LCSWA 11/12/2023 2:06 PM

## 2023-11-12 NOTE — Plan of Care (Signed)
   Problem: Activity: Goal: Interest or engagement in activities will improve Outcome: Progressing   Problem: Coping: Goal: Ability to verbalize frustrations and anger appropriately will improve Outcome: Progressing   Problem: Safety: Goal: Periods of time without injury will increase Outcome: Progressing

## 2023-11-13 DIAGNOSIS — F319 Bipolar disorder, unspecified: Secondary | ICD-10-CM | POA: Diagnosis not present

## 2023-11-13 MED ORDER — QUETIAPINE FUMARATE 200 MG PO TABS
200.0000 mg | ORAL_TABLET | Freq: Every day | ORAL | Status: DC
  Administered 2023-11-13 – 2023-11-14 (×2): 200 mg via ORAL
  Filled 2023-11-13: qty 1
  Filled 2023-11-13: qty 5
  Filled 2023-11-13 (×2): qty 1

## 2023-11-13 NOTE — Group Note (Signed)
 [facility] LCSW Group Therapy Note   Group Date: 11/13/2023 Start Time: 1000 End Time: 1110   Type of Therapy/Topic:  Group Therapy:  Emotion Regulation  Participation Level:  Active   Mood:  Description of Group:    The purpose of this group is to assist patients in learning to regulate negative emotions and experience positive emotions. Patients will be guided to discuss ways in which they have been vulnerable to their negative emotions. These vulnerabilities will be juxtaposed with experiences of positive emotions or situations, and patients challenged to use positive emotions to combat negative ones. Special emphasis will be placed on coping with negative emotions in conflict situations, and patients will process healthy conflict resolution skills.  Therapeutic Goals: Patient will identify two positive emotions or experiences to reflect on in order to balance out negative emotions:  Patient will label two or more emotions that they find the most difficult to experience:  Patient will be able to demonstrate positive conflict resolution skills through discussion or role plays:   Summary of Patient Progress:   Pt was able to identify sources of stress and trauma, but not able to identify natural supports. Pt was emotional and vulnerable during group and open to new possibilities, but still expressed feelings of being tired "when is this going to end". Pt was able to identify healthy behavior to engage in once she's able to be more stabilized on her own.     Therapeutic Modalities:   Cognitive Behavioral Therapy Feelings Identification Dialectical Behavioral Therapy   Steffanie Dunn, LCSWA

## 2023-11-13 NOTE — Plan of Care (Signed)
  Problem: Education: Goal: Mental status will improve Outcome: Progressing   Problem: Activity: Goal: Interest or engagement in activities will improve Outcome: Progressing Goal: Sleeping patterns will improve Outcome: Progressing   Problem: Coping: Goal: Ability to verbalize frustrations and anger appropriately will improve Outcome: Progressing Goal: Ability to demonstrate self-control will improve Outcome: Progressing   Problem: Safety: Goal: Periods of time without injury will increase Outcome: Progressing
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
## 2023-11-13 NOTE — Progress Notes (Signed)
   11/13/23 0823  Psych Admission Type (Psych Patients Only)  Admission Status Involuntary  Psychosocial Assessment  Patient Complaints Anxiety;Depression;Irritability  Eye Contact Fair  Facial Expression Anxious;Worried  Affect Anxious;Irritable  Comptroller Activity Restless  Appearance/Hygiene Unremarkable  Behavior Characteristics Anxious;Irritable  Mood Anxious;Depressed;Irritable  Thought Process  Coherency Circumstantial  Content Blaming others  Delusions None reported or observed  Perception WDL  Hallucination None reported or observed  Judgment WDL  Confusion None  Danger to Self  Current suicidal ideation? Denies  Agreement Not to Harm Self Yes  Description of Agreement Verbal  Danger to Others  Danger to Others None reported or observed

## 2023-11-13 NOTE — Progress Notes (Signed)
   11/12/23 2000  Psych Admission Type (Psych Patients Only)  Admission Status Involuntary  Psychosocial Assessment  Patient Complaints Anxiety;Irritability  Eye Contact Fair  Facial Expression Anxious;Worried  Affect Anxious;Irritable  Comptroller Activity Restless  Appearance/Hygiene Unremarkable  Behavior Characteristics Anxious  Mood Anxious  Thought Process  Coherency Circumstantial  Content Blaming others  Delusions None reported or observed  Perception WDL  Hallucination None reported or observed  Judgment WDL  Confusion None  Danger to Self  Current suicidal ideation? Denies (Denies)  Agreement Not to Harm Self Yes  Description of Agreement verbal  Danger to Others  Danger to Others None reported or observed   Patient c/o anxiety and pain PRN Oxycodone given and Vistaril at HS reported effective. Q 15 minutes safety checks ongoing. Patient remains safe.

## 2023-11-13 NOTE — Group Note (Signed)
 Date:  11/13/2023 Time:  9:33 PM  Group Topic/Focus:  Wrap-Up Group:   The focus of this group is to help patients review their daily goal of treatment and discuss progress on daily workbooks.    Participation Level:  Did Not Attend  Participation Quality:   DNA  Affect:   DNA  Cognitive:   DNA  Insight: DNA  Engagement in Group:  None  Modes of Intervention:  Discussion  Additional Comments:  DNA  Osa Craver 11/13/2023, 9:33 PM

## 2023-11-14 DIAGNOSIS — F319 Bipolar disorder, unspecified: Secondary | ICD-10-CM

## 2023-11-14 NOTE — Progress Notes (Addendum)
 D. Pt has been visible in the milieu, interacting appropriately with peers- observed utilizing wheelchair throughout the shift- pt reported that she hit her right arm accidentally on the arm of her chair when she attempted to stand up in her bedroom. Pt reported that she didn't fall, but her arm slammed onto the wheelchair. This nurse observed a lump on her right forearm. Pt provided with an ice pack and was encouraged to rest arm and continue to ice arm to reduce swelling. Per pt's self inventory, pt rated her depression,hopelessness and anxiety a 1/1/9, respectively. . Pt currently denies SI/HI and AVH .  A. Labs and vitals monitored. Pt given and educated on medications. Pt given prn meds throughout the shift for right foot pain and arm pain. Pt supported emotionally and encouraged to express concerns and ask questions.   R. Pt remains safe with 15 minute checks. Will continue POC.    11/14/23 1300  Psych Admission Type (Psych Patients Only)  Admission Status Involuntary  Psychosocial Assessment  Patient Complaints Anxiety;Worrying  Eye Contact Brief  Facial Expression Pained  Affect Anxious  Speech Logical/coherent  Interaction Assertive  Motor Activity Restless  Appearance/Hygiene Unremarkable  Behavior Characteristics Cooperative;Appropriate to situation  Mood Anxious;Pleasant  Thought Process  Coherency WDL  Content Preoccupation  Delusions None reported or observed  Perception WDL  Hallucination None reported or observed  Judgment Impaired  Confusion None  Danger to Self  Current suicidal ideation? Denies  Danger to Others  Danger to Others None reported or observed

## 2023-11-14 NOTE — Group Note (Signed)
 Date:  11/14/2023 Time:  9:56 PM  Group Topic/Focus:  Wrap-Up Group:   The focus of this group is to help patients review their daily goal of treatment and discuss progress on daily workbooks.    Participation Level:  Active  Participation Quality:  Appropriate  Affect:  Appropriate  Cognitive:  Appropriate  Insight: Appropriate  Engagement in Group:  Engaged  Modes of Intervention:  Education and Exploration  Additional Comments:  Patient attended and participated in group tonight. She reports that when she fell she did not hurt herself further.   Lita Mains [facility] 11/14/2023, 9:56 PM
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
## 2023-11-14 NOTE — Plan of Care (Signed)
  Problem: Education: Goal: Knowledge of [facility] Education information/materials will improve Outcome: Progressing Goal: Emotional status will improve Outcome: Progressing Goal: Mental status will improve Outcome: Progressing   Problem: Coping: Goal: Ability to verbalize frustrations and anger appropriately will improve Outcome: Progressing

## 2023-11-14 NOTE — Group Note (Signed)
 Date:  11/14/2023 Time:  9:52 AM  Group Topic/Focus:  Goals Group:   The focus of this group is to help patients establish daily goals to achieve during treatment and discuss how the patient can incorporate goal setting into their daily lives to aide in recovery. Orientation:   The focus of this group is to educate the patient on the purpose and policies of crisis stabilization and provide a format to answer questions about their admission.  The group details unit policies and expectations of patients while admitted.    Participation Level:  Active  Participation Quality:  Appropriate  Affect:  Appropriate  Cognitive:  Appropriate  Insight: Appropriate  Engagement in Group:  Engaged  Modes of Intervention:  Discussion and Orientation  Additional Comments:  Goal is to have pain control and shower  Azalee Course 11/14/2023, 9:52 AM

## 2023-11-14 NOTE — Progress Notes (Signed)
   11/13/23 2030  Psych Admission Type (Psych Patients Only)  Admission Status Involuntary  Psychosocial Assessment  Patient Complaints Anxiety;Depression;Irritability;Other (Comment) (Pt c/o ongoing pain 10/10 and receiving prn pain meds. Pt is apologetic d/t speaking abruptly d/t constant pain.  Pt winces and affect is pained expression.  Pt verbalized, "something feels wrong like the pins have shifted.")  Eye Contact Brief  Facial Expression Grimacing;Pained  Affect Depressed  Speech Logical/coherent;Pressured  Interaction Assertive  Motor Activity Restless  Appearance/Hygiene Unremarkable  Behavior Characteristics Appropriate to situation  Mood Anxious;Depressed;Other (Comment) ("Something is healing wrong in my foot/ankle." Pt appears distraught from the pain but is trying to be cooperative on unit.)  Thought Process  Coherency WDL  Content Preoccupation  Delusions None reported or observed  Perception WDL  Hallucination None reported or observed  Judgment Impaired  Confusion None  Danger to Self  Current suicidal ideation? Denies  Agreement Not to Harm Self Yes  Description of Agreement Verbal  Danger to Others  Danger to Others None reported or observed

## 2023-11-15 ENCOUNTER — Other Ambulatory Visit: Payer: Self-pay

## 2023-11-15 ENCOUNTER — Emergency Department (HOSPITAL_COMMUNITY)
Admission: EM | Admit: 2023-11-15 | Discharge: 2023-11-16 | Disposition: A | Discharge status: Other Acute Inpatient Hospital | Payer: MEDICAID | Attending: Emergency Medicine | Admitting: Emergency Medicine

## 2023-11-15 ENCOUNTER — Encounter (HOSPITAL_COMMUNITY): Payer: Self-pay | Admitting: Nurse Practitioner

## 2023-11-15 ENCOUNTER — Encounter (HOSPITAL_COMMUNITY): Payer: Self-pay | Admitting: Emergency Medicine

## 2023-11-15 DIAGNOSIS — R45851 Suicidal ideations: Secondary | ICD-10-CM | POA: Insufficient documentation

## 2023-11-15 DIAGNOSIS — F3181 Bipolar II disorder: Secondary | ICD-10-CM | POA: Diagnosis not present

## 2023-11-15 DIAGNOSIS — F603 Borderline personality disorder: Secondary | ICD-10-CM | POA: Diagnosis not present

## 2023-11-15 DIAGNOSIS — Z8659 Personal history of other mental and behavioral disorders: Secondary | ICD-10-CM

## 2023-11-15 DIAGNOSIS — F17210 Nicotine dependence, cigarettes, uncomplicated: Secondary | ICD-10-CM | POA: Insufficient documentation

## 2023-11-15 DIAGNOSIS — T1491XA Suicide attempt, initial encounter: Secondary | ICD-10-CM

## 2023-11-15 DIAGNOSIS — F319 Bipolar disorder, unspecified: Secondary | ICD-10-CM | POA: Diagnosis not present

## 2023-11-15 HISTORY — DX: Personal history of other mental and behavioral disorders: Z86.59

## 2023-11-15 HISTORY — DX: Suicide attempt, initial encounter: T14.91XA

## 2023-11-15 HISTORY — DX: Borderline personality disorder: F60.3

## 2023-11-15 LAB — COMPREHENSIVE METABOLIC PANEL WITH GFR
ALT: 21 U/L (ref 0–44)
AST: 17 U/L (ref 15–41)
Albumin: 4.2 g/dL (ref 3.5–5.0)
Alkaline Phosphatase: 70 U/L (ref 38–126)
Anion gap: 9 (ref 5–15)
BUN: 18 mg/dL (ref 6–20)
CO2: 21 mmol/L — ABNORMAL LOW (ref 22–32)
Calcium: 9.3 mg/dL (ref 8.9–10.3)
Chloride: 104 mmol/L (ref 98–111)
Creatinine, Ser: 0.76 mg/dL (ref 0.44–1.00)
GFR, Estimated: >60 mL/min (ref >60)
Glucose, Bld: 112 mg/dL — ABNORMAL HIGH (ref 70–99)
Potassium: 3.9 mmol/L (ref 3.5–5.1)
Sodium: 134 mmol/L — ABNORMAL LOW (ref 135–145)
Total Bilirubin: 0.3 mg/dL (ref 0.0–1.2)
Total Protein: 7.5 g/dL (ref 6.5–8.1)

## 2023-11-15 LAB — CBC
HCT: 40.9 % (ref 36.0–46.0)
Hemoglobin: 13.6 g/dL (ref 12.0–15.0)
MCH: 33.2 pg (ref 26.0–34.0)
MCHC: 33.3 g/dL (ref 30.0–36.0)
MCV: 99.8 fL (ref 80.0–100.0)
Platelets: 320 10*3/uL (ref 150–400)
RBC: 4.1 MIL/uL (ref 3.87–5.11)
RDW: 12.3 % (ref 11.5–15.5)
WBC: 6.8 10*3/uL (ref 4.0–10.5)
nRBC: 0 % (ref 0.0–0.2)

## 2023-11-15 LAB — ACETAMINOPHEN LEVEL: Acetaminophen (Tylenol), Serum: 30 ug/mL (ref 10–30)

## 2023-11-15 LAB — HCG, SERUM, QUALITATIVE: Preg, Serum: NEGATIVE

## 2023-11-15 LAB — SALICYLATE LEVEL: Salicylate Lvl: <7 mg/dL — ABNORMAL LOW (ref 7.0–30.0)

## 2023-11-15 LAB — ETHANOL: Alcohol, Ethyl (B): 38 mg/dL — ABNORMAL HIGH (ref <10)

## 2023-11-15 MED ORDER — BUSPIRONE HCL 10 MG PO TABS: 10.0000 mg | ORAL_TABLET | Freq: Three times a day (TID) | ORAL | 0 refills | Status: DC

## 2023-11-15 MED ORDER — ROPINIROLE HCL 2 MG PO TABS: 2.0000 mg | ORAL_TABLET | Freq: Every day | ORAL | 0 refills | Status: DC

## 2023-11-15 MED ORDER — CLOPIDOGREL BISULFATE 75 MG PO TABS
75.0000 mg | ORAL_TABLET | Freq: Every day | ORAL | Status: DC
  Filled 2023-11-15: qty 1

## 2023-11-15 MED ORDER — QUETIAPINE FUMARATE 200 MG PO TABS: 200.0000 mg | ORAL_TABLET | Freq: Every day | ORAL | 0 refills | Status: DC

## 2023-11-15 MED ORDER — CARMEX CLASSIC LIP BALM EX OINT: 1.0000 | TOPICAL_OINTMENT | CUTANEOUS | Status: DC | PRN

## 2023-11-15 MED ORDER — LAMOTRIGINE 200 MG PO TABS
200.0000 mg | ORAL_TABLET | Freq: Every day | ORAL | 0 refills | Status: DC
Start: 2023-11-16 — End: 2023-11-22

## 2023-11-15 MED ORDER — SERTRALINE HCL 50 MG PO TABS: 50.0000 mg | ORAL_TABLET | Freq: Every day | ORAL | 0 refills | Status: DC

## 2023-11-15 MED ORDER — HYDROXYZINE HCL 25 MG PO TABS
25.0000 mg | ORAL_TABLET | Freq: Three times a day (TID) | ORAL | Status: DC | PRN
Start: 2023-11-15 — End: 2023-11-16
  Administered 2023-11-16 (×2): 25 mg via ORAL
  Filled 2023-11-15 (×2): qty 1

## 2023-11-15 MED ORDER — FOLIC ACID 1 MG PO TABS
1.0000 mg | ORAL_TABLET | Freq: Every day | ORAL | Status: DC
  Administered 2023-11-16: 1 mg via ORAL
  Filled 2023-11-15: qty 1

## 2023-11-15 MED ORDER — ACETAMINOPHEN 325 MG PO TABS
650.0000 mg | ORAL_TABLET | Freq: Four times a day (QID) | ORAL | Status: DC | PRN
  Administered 2023-11-16 (×2): 650 mg via ORAL
  Filled 2023-11-15 (×2): qty 2

## 2023-11-15 MED ORDER — LAMOTRIGINE 100 MG PO TABS
200.0000 mg | ORAL_TABLET | Freq: Every day | ORAL | Status: DC
  Administered 2023-11-16: 200 mg via ORAL
  Filled 2023-11-15: qty 2

## 2023-11-15 MED ORDER — ROPINIROLE HCL 1 MG PO TABS
2.0000 mg | ORAL_TABLET | Freq: Every day | ORAL | Status: DC
Start: 2023-11-15 — End: 2023-11-16
  Administered 2023-11-16: 2 mg via ORAL
  Filled 2023-11-15 (×2): qty 2

## 2023-11-15 MED ORDER — QUETIAPINE FUMARATE 100 MG PO TABS
200.0000 mg | ORAL_TABLET | Freq: Every day | ORAL | Status: DC
  Administered 2023-11-16: 200 mg via ORAL
  Filled 2023-11-15: qty 2

## 2023-11-15 MED ORDER — POLYVINYL ALCOHOL 1.4 % OP SOLN: 1.0000 [drp] | Freq: Four times a day (QID) | OPHTHALMIC | Status: DC | PRN

## 2023-11-15 MED ORDER — HYDROXYZINE HCL 25 MG PO TABS: 25.0000 mg | ORAL_TABLET | Freq: Three times a day (TID) | ORAL | 0 refills | Status: DC | PRN

## 2023-11-15 MED ORDER — TRAZODONE HCL 50 MG PO TABS
50.0000 mg | ORAL_TABLET | Freq: Every day | ORAL | Status: DC
  Administered 2023-11-16: 50 mg via ORAL
  Filled 2023-11-15: qty 1

## 2023-11-15 MED ORDER — SERTRALINE HCL 50 MG PO TABS
50.0000 mg | ORAL_TABLET | Freq: Every day | ORAL | Status: DC
  Administered 2023-11-16: 50 mg via ORAL
  Filled 2023-11-15: qty 1

## 2023-11-15 MED ORDER — CARBOXYMETHYLCELL-GLYCERIN PF 0.5-0.9 % OP SOLN: 1.0000 [drp] | Freq: Four times a day (QID) | OPHTHALMIC | Status: DC | PRN

## 2023-11-15 MED ORDER — GABAPENTIN 300 MG PO CAPS: 600.0000 mg | ORAL_CAPSULE | Freq: Three times a day (TID) | ORAL | 0 refills | Status: DC

## 2023-11-15 MED ORDER — WHITE PETROLATUM EX OINT
TOPICAL_OINTMENT | CUTANEOUS | Status: AC
  Filled 2023-11-15: qty 5

## 2023-11-15 MED ORDER — BUSPIRONE HCL 10 MG PO TABS
10.0000 mg | ORAL_TABLET | Freq: Three times a day (TID) | ORAL | Status: DC
  Administered 2023-11-16 (×2): 10 mg via ORAL
  Filled 2023-11-15 (×2): qty 1

## 2023-11-15 MED ORDER — TRAZODONE HCL 50 MG PO TABS
50.0000 mg | ORAL_TABLET | Freq: Every day | ORAL | 0 refills | Status: DC
Start: 2023-11-15 — End: 2023-11-22

## 2023-11-15 MED ORDER — NALOXONE HCL 4 MG/0.1ML NA LIQD: 1.0000 | NASAL | Status: DC | PRN

## 2023-11-15 MED ORDER — CLOPIDOGREL BISULFATE 75 MG PO TABS: 75.0000 mg | ORAL_TABLET | Freq: Every day | ORAL | 0 refills | Status: DC

## 2023-11-15 MED ORDER — ASPIRIN 81 MG PO TBEC
81.0000 mg | DELAYED_RELEASE_TABLET | Freq: Every day | ORAL | Status: DC
  Administered 2023-11-16: 81 mg via ORAL
  Filled 2023-11-15: qty 1
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
## 2023-11-15 NOTE — Progress Notes (Addendum)
 Patient given apap, ibuprofen, and oxy for complaints of acute/chronic pain to LE. Minimal effect.   11/14/23 2133  Psych Admission Type (Psych Patients Only)  Admission Status Involuntary  Psychosocial Assessment  Patient Complaints Anger;Agitation;Anxiety;Irritability;Depression  Eye Contact Fair  Facial Expression Animated;Anxious  Affect Anxious  Speech Logical/coherent  Interaction Assertive;Attention-seeking;Hostile;Demanding  Motor Activity Restless;Fidgety  Appearance/Hygiene Unremarkable  Behavior Characteristics Cooperative;Appropriate to situation  Mood Anxious;Pleasant  Aggressive Behavior  Effect No apparent injury  Thought Process  Coherency WDL  Content Preoccupation  Delusions None reported or observed  Perception WDL  Hallucination None reported or observed  Judgment Impaired  Confusion None  Danger to Self  Current suicidal ideation? Denies  Agreement Not to Harm Self Yes  Description of Agreement verbal  Danger to Others  Danger to Others None reported or observed

## 2023-11-15 NOTE — [facility] Group Notes (Signed)
 Spirituality Group  Goal: This group is driven by participant needs and themes that emerge from current concerns.  Chaplain offers ground rules and basic framework for engagement, naming the ways that emotions, sense of self and values, grief, and relationship among other concerns all comprise spiritual life.  Theoretical basis: Using the group therapy frameworks of Chyrl Civatte as well as principles in Relational Cultural Therapy as well as Rogerian approaches, participants are invited to explore spiritual needs and to respond in ways that foster mutual empathy and relational support.  Observations: [name] was an active participant in the group discussion. She was courageous in sharing with vulnerability what she needed from peers and this resulting in their engagement in mutual empathy and greatly expanded the conversation.  [name], M.Div [phone]

## 2023-11-15 NOTE — Group Note (Signed)
 Date:  11/15/2023 Time:  10:02 AM  Group Topic/Focus:  Goals Group:   The focus of this group is to help patients establish daily goals to achieve during treatment and discuss how the patient can incorporate goal setting into their daily lives to aide in recovery.    Participation Level:  patient did not attend group  Participation Quality:    Affect:    Cognitive:    Insight:   Engagement in Group:    Modes of Intervention:    Additional Comments:    Merlene Morse 11/15/2023, 10:02 AM
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
## 2023-11-15 NOTE — [facility] Suicide Risk Assessment (Signed)
 [facility] INPATIENT:  Family/Significant Other Suicide Prevention Education  Suicide Prevention Education:   Suicide Prevention Education was reviewed thoroughly with patient, including risk factors, warning signs, and what to do. Mobile Crisis services were described and that telephone number pointed out, with encouragement to patient to put this number in personal cell phone. Brochure was provided to patient to share with natural supports. Patient acknowledged the ways in which they are at risk, and how working through each of their issues can gradually start to reduce their risk factors. Patient was encouraged to think of the information in the context of people in their own lives. Patient denied having access to firearms Patient verbalized understanding of information provided. Patient endorsed a desire to live.    Joelyn [place], LCSW 11/15/23 4:41 PM

## 2023-11-15 NOTE — Group Note (Signed)
 Date:  11/15/2023 Time:  2:28 PM  Group Topic/Focus:  Healthy Communication:   The focus of this group is to discuss communication, barriers to communication, as well as healthy ways to communicate with others.    Participation Level:  Did Not Attend  Participation Quality:    Affect:    Cognitive:    Insight:   Engagement in Group:    Modes of Intervention:    Additional Comments:    Merlene Morse 11/15/2023, 2:28 PM

## 2023-11-15 NOTE — [facility] Suicide Risk Assessment (Signed)
 [facility] INPATIENT:  Family/Significant Other Suicide Prevention Education  Suicide Prevention Education:  Family/Significant Other Refusal to Support Patient after Discharge:  Suicide Prevention Education Not Provided:  Patient has identified home of family/significant other as the place the patient will be residing after discharge.  With written consent of the patient, two attempts were made to provide Suicide Prevention Education to husband, Shaira Sova [phone], (name of family member/significant other).  This person indicates he/she will not be responsible for the patient after discharge.   Kathi Der 11/15/2023,8:19 AM

## 2023-11-15 NOTE — Progress Notes (Signed)
   11/15/23 1200  Psych Admission Type (Psych Patients Only)  Admission Status Involuntary  Psychosocial Assessment  Patient Complaints Irritability;Anxiety  Eye Contact Fair  Facial Expression Flat;Pained  Affect Anxious  Speech Logical/coherent  Interaction Assertive  Motor Activity Restless  Appearance/Hygiene Unremarkable  Behavior Characteristics Cooperative  Mood Anxious;Pleasant  Thought Process  Coherency WDL  Content WDL  Delusions None reported or observed  Perception WDL  Hallucination None reported or observed  Judgment Impaired  Confusion None  Danger to Self  Current suicidal ideation? Denies  Agreement Not to Harm Self Yes  Description of Agreement verbal  Danger to Others  Danger to Others None reported or observed
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
## 2023-11-15 NOTE — Group Note (Signed)
 Date:  11/15/2023 Time:  11:09 AM  Group Topic/Focus:  Developing a Wellness Toolbox:   The focus of this group is to help patients develop a "wellness toolbox" with skills and strategies to promote recovery upon discharge. Dimensions of Wellness:   The focus of this group is to introduce the topic of wellness and discuss the role each dimension of wellness plays in total health.    Participation Level:  Minimal  Participation Quality:    Affect:    Cognitive:    Insight:   Engagement in Group:  Patient left early   Modes of Intervention:    Additional Comments:  Patient left early due to it being "to hot"  Merlene Morse 11/15/2023, 11:09 AM

## 2023-11-15 NOTE — [facility] Suicide Risk Assessment (Deleted)
 [facility] INPATIENT:  Family/Significant Other Suicide Prevention Education  Suicide Prevention Education:  Education Completed; Husband, Marguerita Stapp [phone],  (name of family member/significant other) has been identified by the patient as the family member/significant other with whom the patient will be residing, and identified as the person(s) who will aid the patient in the event of a mental health crisis (suicidal ideations/suicide attempt).  With written consent from the patient, the family member/significant other has been provided the following suicide prevention education, prior to the and/or following the discharge of the patient.  The suicide prevention education provided includes the following: Suicide risk factors Suicide prevention and interventions National Suicide Hotline telephone number [facility] assessment telephone number [facility] Emergency Assistance 911 [facility] and/or Residential Mobile Crisis Unit telephone number  Request made of family/significant other to: Remove weapons (e.g., guns, rifles, knives), all items previously/currently identified as safety concern.   Remove drugs/medications (over-the-counter, prescriptions, illicit drugs), all items previously/currently identified as a safety concern.  The family member/significant other verbalizes understanding of the suicide prevention education information provided.  The family member/significant other agrees to remove the items of safety concern listed above.  Kathi Der 11/15/2023, 8:04 AM

## 2023-11-15 NOTE — Group Note (Signed)
 Recreation Therapy Group Note   Group Topic:Healthy Decision Making  Group Date: 11/15/2023 Start Time: 1610 End Time: 1015 Facilitators: Alexande Sheerin-McCall, LRT,CTRS Location: [address] Dayroom   Group Topic: Decision Making, Problem Solving, Communication  Goal Area(s) Addresses:  Patient will effectively work with peer towards shared goal.  Patient will identify factors that guided their decision making.  Patient will pro-socially communicate ideas during group session.   Intervention: Survival Scenario - pencil, paper  Activity: Patients were given a scenario that they were going to be stranded on a deserted [place] for several months before being rescued. Writer tasked them with making a list of 15 things they would choose to bring with them for "survival". The list of items was prioritized most important to least. Each patient would come up with their own list, then work together to create a new list of 15 items while in a group of 3-5 peers. LRT discussed each person's list and how it differed from others. The debrief included discussion of priorities, good decisions versus bad decisions, and how it is important to think before acting so we can make the best decision possible. LRT tied the concept of effective communication among group members to patient's support systems outside of the hospital and its benefit post discharge.  Education: Pharmacist, community, Priorities, Support System, Discharge Planning   Education Outcome: Acknowledges education/In group clarification/Needs additional education   Affect/Mood: Appropriate   Participation Level: Engaged   Participation Quality: Independent   Behavior: Appropriate   Speech/Thought Process: Focused   Insight: Good   Judgement: Good   Modes of Intervention: Group work   Patient Response to Interventions:  Engaged   Education Outcome:  In group clarification offered    Clinical Observations/Individualized Feedback: Pt  was bright and appeared focused on trying to come up with items for being on the deserted [place]. Pt worked well with peers and helped come up with items like blankets, saw/axe, pots/cups, radio, clothes, sleeping bags, compass, flashlight, etc.     Plan: Continue to engage patient in RT group sessions 2-3x/week.   Yarnell Arvidson-McCall, LRT,CTRS 11/15/2023 1:30 PM
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
## 2023-11-15 NOTE — Progress Notes (Signed)
 [facility] Adult Case Management Discharge Plan :  Will you be returning to the same living situation after discharge:  Yes,  HOME W/ SPOUSE At discharge, do you have transportation home?: Yes,  SPOUSE TO PROVIDE Do you have the ability to pay for your medications: Yes,  ACTIVE MEDICAID  Release of information consent forms completed and in the chart;  Patient's signature needed at discharge.  Patient to Follow up at:  Follow-up Information     [facility] Follow up on 11/18/2023.   Why: You have an appointment for medication management services on 11/18/23 at 10:20 am, Virtual telehealth. Contact information: [address], [place], [place] [address] Phone: [phone]        [facility]. Call on 11/17/2023.   Why: Please call to schedule a hospital follow up appointment for therapy services as soon as possible, as we were unable to do so prior to your discharge.  It will be a Scientific laboratory technician. Contact information: [address]  [address] [place] [address] [phone]                 Next level of care provider has access to [facility] Link:no  Safety Planning and Suicide Prevention discussed: Yes,  COMPLETED WITH SPOUSE WHO INITIALLY REFUSED PICKUP     Has patient been referred to the Quitline?: Patient refused referral for treatment  Patient has been referred for addiction treatment: Yes, referral information given but appointment not made [facility] (list facility).  Joelyn Oms [place], LCSW 11/15/2023, 4:18 PM

## 2023-11-15 NOTE — Plan of Care (Signed)
  Problem: Education: Goal: Knowledge of [facility] Education information/materials will improve Outcome: Progressing Goal: Emotional status will improve Outcome: Progressing Goal: Mental status will improve Outcome: Progressing Goal: Verbalization of understanding the information provided will improve Outcome: Progressing   Problem: Activity: Goal: Interest or engagement in activities will improve Outcome: Progressing Goal: Sleeping patterns will improve Outcome: Progressing   Problem: Coping: Goal: Ability to verbalize frustrations and anger appropriately will improve Outcome: Progressing Goal: Ability to demonstrate self-control will improve Outcome: Progressing   Problem: Safety: Goal: Periods of time without injury will increase Outcome: Progressing

## 2023-11-16 ENCOUNTER — Encounter: Payer: Self-pay | Admitting: Nurse Practitioner

## 2023-11-16 ENCOUNTER — Inpatient Hospital Stay
Admission: AD | Admit: 2023-11-16 | Discharge: 2023-11-22 | DRG: 885 | Disposition: A | Discharge status: Home / Self Care | Payer: MEDICAID | Source: Intra-hospital | Attending: Psychiatry | Admitting: Psychiatry

## 2023-11-16 DIAGNOSIS — Z91410 Personal history of adult physical and sexual abuse: Secondary | ICD-10-CM | POA: Diagnosis not present

## 2023-11-16 DIAGNOSIS — F1510 Other stimulant abuse, uncomplicated: Secondary | ICD-10-CM | POA: Diagnosis present

## 2023-11-16 DIAGNOSIS — F515 Nightmare disorder: Secondary | ICD-10-CM | POA: Diagnosis present

## 2023-11-16 DIAGNOSIS — F1010 Alcohol abuse, uncomplicated: Secondary | ICD-10-CM | POA: Diagnosis present

## 2023-11-16 DIAGNOSIS — Y901 Blood alcohol level of 20-39 mg/100 ml: Secondary | ICD-10-CM | POA: Diagnosis present

## 2023-11-16 DIAGNOSIS — G8929 Other chronic pain: Secondary | ICD-10-CM | POA: Diagnosis present

## 2023-11-16 DIAGNOSIS — F603 Borderline personality disorder: Secondary | ICD-10-CM | POA: Diagnosis present

## 2023-11-16 DIAGNOSIS — F3181 Bipolar II disorder: Secondary | ICD-10-CM

## 2023-11-16 DIAGNOSIS — Z8052 Family history of malignant neoplasm of bladder: Secondary | ICD-10-CM | POA: Diagnosis not present

## 2023-11-16 DIAGNOSIS — Z5941 Food insecurity: Secondary | ICD-10-CM

## 2023-11-16 DIAGNOSIS — Z7902 Long term (current) use of antithrombotics/antiplatelets: Secondary | ICD-10-CM

## 2023-11-16 DIAGNOSIS — F410 Panic disorder [episodic paroxysmal anxiety] without agoraphobia: Secondary | ICD-10-CM | POA: Diagnosis present

## 2023-11-16 DIAGNOSIS — Z801 Family history of malignant neoplasm of trachea, bronchus and lung: Secondary | ICD-10-CM

## 2023-11-16 DIAGNOSIS — Z818 Family history of other mental and behavioral disorders: Secondary | ICD-10-CM | POA: Diagnosis not present

## 2023-11-16 DIAGNOSIS — Z7982 Long term (current) use of aspirin: Secondary | ICD-10-CM

## 2023-11-16 DIAGNOSIS — T426X2A Poisoning by other antiepileptic and sedative-hypnotic drugs, intentional self-harm, initial encounter: Secondary | ICD-10-CM | POA: Diagnosis present

## 2023-11-16 DIAGNOSIS — F4310 Post-traumatic stress disorder, unspecified: Secondary | ICD-10-CM | POA: Diagnosis present

## 2023-11-16 DIAGNOSIS — Z9151 Personal history of suicidal behavior: Secondary | ICD-10-CM

## 2023-11-16 DIAGNOSIS — Z9152 Personal history of nonsuicidal self-harm: Secondary | ICD-10-CM

## 2023-11-16 DIAGNOSIS — R45851 Suicidal ideations: Secondary | ICD-10-CM | POA: Diagnosis present

## 2023-11-16 DIAGNOSIS — Z803 Family history of malignant neoplasm of breast: Secondary | ICD-10-CM

## 2023-11-16 DIAGNOSIS — Z87410 Personal history of cervical dysplasia: Secondary | ICD-10-CM | POA: Diagnosis not present

## 2023-11-16 DIAGNOSIS — Z809 Family history of malignant neoplasm, unspecified: Secondary | ICD-10-CM | POA: Diagnosis not present

## 2023-11-16 DIAGNOSIS — Z91199 Patient's noncompliance with other medical treatment and regimen due to unspecified reason: Secondary | ICD-10-CM

## 2023-11-16 DIAGNOSIS — Z79899 Other long term (current) drug therapy: Secondary | ICD-10-CM

## 2023-11-16 DIAGNOSIS — F17210 Nicotine dependence, cigarettes, uncomplicated: Secondary | ICD-10-CM | POA: Diagnosis present

## 2023-11-16 LAB — RAPID URINE DRUG SCREEN, HOSP PERFORMED
Amphetamines: NOT DETECTED
Barbiturates: NOT DETECTED
Benzodiazepines: NOT DETECTED
Cocaine: NOT DETECTED
Opiates: NOT DETECTED
Tetrahydrocannabinol: NOT DETECTED

## 2023-11-16 MED ORDER — CLOPIDOGREL BISULFATE 75 MG PO TABS
75.0000 mg | ORAL_TABLET | Freq: Every day | ORAL | Status: DC
  Administered 2023-11-17 – 2023-11-22 (×6): 75 mg via ORAL
  Filled 2023-11-16 (×7): qty 1

## 2023-11-16 MED ORDER — CARMEX CLASSIC LIP BALM EX OINT: 1.0000 | TOPICAL_OINTMENT | CUTANEOUS | Status: DC | PRN

## 2023-11-16 MED ORDER — NALOXONE HCL 4 MG/0.1ML NA LIQD: 1.0000 | NASAL | Status: DC | PRN

## 2023-11-16 MED ORDER — ROPINIROLE HCL 1 MG PO TABS
2.0000 mg | ORAL_TABLET | Freq: Every day | ORAL | Status: DC
Start: 2023-11-16 — End: 2023-11-22
  Administered 2023-11-16 – 2023-11-21 (×6): 2 mg via ORAL
  Filled 2023-11-16 (×7): qty 2

## 2023-11-16 MED ORDER — LORAZEPAM 2 MG/ML IJ SOLN: 2.0000 mg | Freq: Three times a day (TID) | INTRAMUSCULAR | Status: DC | PRN

## 2023-11-16 MED ORDER — DIPHENHYDRAMINE HCL 50 MG/ML IJ SOLN: 50.0000 mg | Freq: Three times a day (TID) | INTRAMUSCULAR | Status: DC | PRN

## 2023-11-16 MED ORDER — POLYVINYL ALCOHOL 1.4 % OP SOLN: 1.0000 [drp] | Freq: Four times a day (QID) | OPHTHALMIC | Status: DC | PRN

## 2023-11-16 MED ORDER — HALOPERIDOL 5 MG PO TABS
5.0000 mg | ORAL_TABLET | Freq: Three times a day (TID) | ORAL | Status: DC | PRN
  Administered 2023-11-18: 5 mg via ORAL
  Filled 2023-11-16: qty 1

## 2023-11-16 MED ORDER — HALOPERIDOL LACTATE 5 MG/ML IJ SOLN: 10.0000 mg | Freq: Three times a day (TID) | INTRAMUSCULAR | Status: DC | PRN

## 2023-11-16 MED ORDER — LORAZEPAM 2 MG/ML IJ SOLN
2.0000 mg | Freq: Three times a day (TID) | INTRAMUSCULAR | Status: DC | PRN
Start: 2023-11-16 — End: 2023-11-22

## 2023-11-16 MED ORDER — MAGNESIUM HYDROXIDE 400 MG/5ML PO SUSP
30.0000 mL | Freq: Every day | ORAL | Status: DC | PRN
  Administered 2023-11-19 – 2023-11-21 (×2): 30 mL via ORAL
  Filled 2023-11-16 (×2): qty 30

## 2023-11-16 MED ORDER — DIPHENHYDRAMINE HCL 25 MG PO CAPS
50.0000 mg | ORAL_CAPSULE | Freq: Three times a day (TID) | ORAL | Status: DC | PRN
  Administered 2023-11-18 – 2023-11-22 (×6): 50 mg via ORAL
  Filled 2023-11-16 (×5): qty 2

## 2023-11-16 MED ORDER — GABAPENTIN 300 MG PO CAPS: 600.0000 mg | ORAL_CAPSULE | Freq: Three times a day (TID) | ORAL | Status: DC

## 2023-11-16 MED ORDER — HYDROXYZINE HCL 25 MG PO TABS
25.0000 mg | ORAL_TABLET | Freq: Three times a day (TID) | ORAL | Status: DC | PRN
Start: 2023-11-16 — End: 2023-11-18
  Administered 2023-11-17 – 2023-11-18 (×5): 25 mg via ORAL
  Filled 2023-11-16 (×5): qty 1

## 2023-11-16 MED ORDER — ALUM & MAG HYDROXIDE-SIMETH 200-200-20 MG/5ML PO SUSP: 30.0000 mL | ORAL | Status: DC | PRN

## 2023-11-16 MED ORDER — GABAPENTIN 300 MG PO CAPS
600.0000 mg | ORAL_CAPSULE | Freq: Three times a day (TID) | ORAL | Status: DC
  Administered 2023-11-16 (×2): 600 mg via ORAL
  Filled 2023-11-16 (×2): qty 2

## 2023-11-16 MED ORDER — NICOTINE 21 MG/24HR TD PT24
21.0000 mg | MEDICATED_PATCH | Freq: Once | TRANSDERMAL | Status: DC
  Administered 2023-11-16: 21 mg via TRANSDERMAL
  Filled 2023-11-16: qty 1

## 2023-11-16 MED ORDER — HALOPERIDOL LACTATE 5 MG/ML IJ SOLN: 5.0000 mg | Freq: Three times a day (TID) | INTRAMUSCULAR | Status: DC | PRN

## 2023-11-16 MED ORDER — LAMOTRIGINE 100 MG PO TABS
200.0000 mg | ORAL_TABLET | Freq: Every day | ORAL | Status: DC
  Administered 2023-11-17 – 2023-11-22 (×6): 200 mg via ORAL
  Filled 2023-11-16 (×6): qty 2

## 2023-11-16 MED ORDER — QUETIAPINE FUMARATE 200 MG PO TABS
200.0000 mg | ORAL_TABLET | Freq: Every day | ORAL | Status: DC
  Administered 2023-11-16 – 2023-11-21 (×6): 200 mg via ORAL
  Filled 2023-11-16 (×6): qty 1

## 2023-11-16 MED ORDER — ACETAMINOPHEN 325 MG PO TABS
650.0000 mg | ORAL_TABLET | Freq: Four times a day (QID) | ORAL | Status: DC | PRN
  Administered 2023-11-16 – 2023-11-17 (×4): 650 mg via ORAL
  Filled 2023-11-16 (×4): qty 2

## 2023-11-16 MED ORDER — IBUPROFEN 200 MG PO TABS
400.0000 mg | ORAL_TABLET | Freq: Once | ORAL | Status: AC
  Administered 2023-11-16: 400 mg via ORAL
  Filled 2023-11-16: qty 2

## 2023-11-16 MED ORDER — FOLIC ACID 1 MG PO TABS
1.0000 mg | ORAL_TABLET | Freq: Every day | ORAL | Status: DC
  Administered 2023-11-17 – 2023-11-22 (×6): 1 mg via ORAL
  Filled 2023-11-16 (×6): qty 1

## 2023-11-16 MED ORDER — ASPIRIN 81 MG PO TBEC
81.0000 mg | DELAYED_RELEASE_TABLET | Freq: Every day | ORAL | Status: DC
  Administered 2023-11-17 – 2023-11-22 (×6): 81 mg via ORAL
  Filled 2023-11-16 (×6): qty 1

## 2023-11-16 MED ORDER — SERTRALINE HCL 25 MG PO TABS
50.0000 mg | ORAL_TABLET | Freq: Every day | ORAL | Status: DC
  Administered 2023-11-17 – 2023-11-22 (×6): 50 mg via ORAL
  Filled 2023-11-16 (×6): qty 2

## 2023-11-16 MED ORDER — GABAPENTIN 300 MG PO CAPS
600.0000 mg | ORAL_CAPSULE | Freq: Three times a day (TID) | ORAL | Status: DC
  Administered 2023-11-16 – 2023-11-22 (×18): 600 mg via ORAL
  Filled 2023-11-16 (×18): qty 2

## 2023-11-16 MED ORDER — TRAZODONE HCL 50 MG PO TABS
50.0000 mg | ORAL_TABLET | Freq: Every day | ORAL | Status: DC
  Administered 2023-11-16 – 2023-11-21 (×6): 50 mg via ORAL
  Filled 2023-11-16 (×6): qty 1

## 2023-11-16 MED ORDER — IBUPROFEN 600 MG PO TABS
800.0000 mg | ORAL_TABLET | Freq: Once | ORAL | Status: AC
  Administered 2023-11-16: 800 mg via ORAL
  Filled 2023-11-16: qty 1

## 2023-11-16 MED ORDER — BUSPIRONE HCL 5 MG PO TABS
10.0000 mg | ORAL_TABLET | Freq: Three times a day (TID) | ORAL | Status: DC
  Administered 2023-11-16 – 2023-11-22 (×18): 10 mg via ORAL
  Filled 2023-11-16 (×18): qty 2

## 2023-11-16 NOTE — ED Notes (Signed)
 [employer] er route for transport.

## 2023-11-16 NOTE — ED Notes (Signed)
 [facility] en route. ETA 10 mins.

## 2023-11-16 NOTE — ED Notes (Signed)
 Pt complaining about foot pain, MD [facility] notified about need for pain med.

## 2023-11-16 NOTE — Progress Notes (Addendum)
 Pt was accepted to [facility]  11/16/2023 Bed Assignment  302  Address: [address] [place], [place], [place] [address]  BMU FAX Number [phone]  Pt meets inpatient criteria per: Dahlia Byes NP  Attending Physician will be Dr. Callie Fielding  Report can be called to: [phone]  Pt can arrive after discharges   Care Team notified:[facility], Dahlia Byes NP, Arnette Felts RN, Gigi Maniattu RN, [facility] RN    [place] Alydia Gosser, MSW, [facility] 11/16/2023 11:29 AM
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
## 2023-11-16 NOTE — Group Note (Unsigned)
 Date:  11/16/2023 Time:  8:57 PM  Group Topic/Focus:  Wrap-Up Group:   The focus of this group is to help patients review their daily goal of treatment and discuss progress on daily workbooks.     Participation Level:  {BHH PARTICIPATION VWUJW:11914}  Participation Quality:  {BHH PARTICIPATION QUALITY:22265}  Affect:  {BHH AFFECT:22266}  Cognitive:  {BHH COGNITIVE:22267}  Insight: {BHH Insight2:20797}  Engagement in Group:  {BHH ENGAGEMENT IN NWGNF:62130}  Modes of Intervention:  {BHH MODES OF INTERVENTION:22269}  Additional Comments:  ***  Belva Crome 11/16/2023, 8:57 PM

## 2023-11-16 NOTE — ED Notes (Signed)
 Report given to Gigi at [facility].

## 2023-11-16 NOTE — Group Note (Signed)
 Date:  11/16/2023 Time:  10:19 PM  Group Topic/Focus:  Wrap-Up Group:   The focus of this group is to help patients review their daily goal of treatment and discuss progress on daily workbooks.    Participation Level:  Active  Participation Quality:  Appropriate  Affect:  Depressed  Cognitive:  Appropriate  Insight: Limited  Engagement in Group:  Limited  Modes of Intervention:  Discussion  Additional Comments:     Belva Crome 11/16/2023, 10:19 PM

## 2023-11-16 NOTE — BH Assessment (Signed)
 Patient was deferred to IRIS for a telepsych assessment. The assigned care coordinator will provide updates regarding the scheduling of the assessment. IRIS care coordinator can be reached at [phone] for further information on the timing of the telepsych evaluation.
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
## 2023-11-16 NOTE — ED Notes (Signed)
 Inpt psych

## 2023-11-16 NOTE — ED Notes (Signed)
 Pt was accepted to [facility]  11/16/2023 Bed Assignment  302  Address: [address] [place], [place], [place] [address]  BMU FAX Number [phone]  Pt meets inpatient criteria per: Dahlia Byes NP  Attending Physician will be Dr. Callie Fielding  Report can be called to: [phone]  Pt can arrive after discharges   Care Team notified:Danika [facility], Dahlia Byes NP, Arnette Felts RN, Gigi Maniattu RN, [facility] RN    [place] Alydia Gosser, MSW, [facility] 11/16/2023 11:29 AM

## 2023-11-17 MED ORDER — IBUPROFEN 600 MG PO TABS
800.0000 mg | ORAL_TABLET | Freq: Four times a day (QID) | ORAL | Status: DC | PRN
  Administered 2023-11-17 – 2023-11-22 (×15): 800 mg via ORAL
  Filled 2023-11-17 (×16): qty 1

## 2023-11-17 MED ORDER — NAPROXEN 500 MG PO TABS
250.0000 mg | ORAL_TABLET | Freq: Two times a day (BID) | ORAL | Status: DC
  Administered 2023-11-17 – 2023-11-22 (×10): 250 mg via ORAL
  Filled 2023-11-17 (×10): qty 1

## 2023-11-17 MED ORDER — NICOTINE 21 MG/24HR TD PT24
21.0000 mg | MEDICATED_PATCH | Freq: Every day | TRANSDERMAL | Status: DC
  Administered 2023-11-17 – 2023-11-22 (×6): 21 mg via TRANSDERMAL
  Filled 2023-11-17 (×6): qty 1

## 2023-11-17 MED ORDER — ACETAMINOPHEN 325 MG PO TABS
650.0000 mg | ORAL_TABLET | Freq: Four times a day (QID) | ORAL | Status: DC | PRN
Start: 2023-11-17 — End: 2023-11-22
  Administered 2023-11-17 – 2023-11-22 (×11): 650 mg via ORAL
  Filled 2023-11-17 (×11): qty 2

## 2023-11-17 MED ORDER — NICOTINE POLACRILEX 2 MG MT GUM
2.0000 mg | CHEWING_GUM | OROMUCOSAL | Status: DC | PRN
  Administered 2023-11-17 – 2023-11-21 (×2): 2 mg via ORAL
  Filled 2023-11-17 (×2): qty 1

## 2023-11-17 NOTE — BHH Suicide Risk Assessment (Signed)
 BHH INPATIENT:  Family/Significant Other Suicide Prevention Education  Suicide Prevention Education:  Contact Attempts: [name], husband, [phone], (name of family member/significant other) has been identified by the patient as the family member/significant other with whom the patient will be residing, and identified as the person(s) who will aid the patient in the event of a mental health crisis.  With written consent from the patient, two attempts were made to provide suicide prevention education, prior to and/or following the patient's discharge.  We were unsuccessful in providing suicide prevention education.  A suicide education pamphlet was given to the patient to share with family/significant other.  Date and time of first attempt:3:79M 11/17/2023 Date and time of second attempt: Second attempt is needed.  CSW left HIPAA compliant voicemail.  [name] 11/17/2023, 3:51 PM

## 2023-11-17 NOTE — Group Note (Signed)
 Date:  11/17/2023 Time:  7:21 PM  Group Topic/Focus:  Activity Group: The focus of the group is to promote activity for the patients and encourage them to go outside in the courtyard for exercise and fresh air.    Participation Level:  Active  Participation Quality:  Appropriate  Affect:  Appropriate  Cognitive:  Appropriate  Insight: Appropriate  Engagement in Group:  Engaged  Modes of Intervention:  Activity  Additional Comments:    [name] 11/17/2023, 7:21 PM

## 2023-11-17 NOTE — BH IP Treatment Plan (Signed)
 Interdisciplinary Treatment and Diagnostic Plan Update  11/17/2023 Time of Session: 10:10 AM [name] MRN: [id number]  Principal Diagnosis: Bipolar 2 disorder, major depressive episode (HCC)  Secondary Diagnoses: Principal Problem:   Bipolar 2 disorder, major depressive episode (HCC)   Current Medications:  Current Facility-Administered Medications  Medication Dose Route Frequency Provider Last Rate Last Admin   acetaminophen (TYLENOL) tablet 650 mg  650 mg Oral Q6H PRN Dahlia Byes C, NP   650 mg at 11/17/23 0817   alum & mag hydroxide-simeth (MAALOX/MYLANTA) 200-200-20 MG/5ML suspension 30 mL  30 mL Oral Q4H PRN Earney Navy, NP       aspirin EC tablet 81 mg  81 mg Oral Daily Dahlia Byes C, NP   81 mg at 11/17/23 0817   busPIRone (BUSPAR) tablet 10 mg  10 mg Oral TID Dahlia Byes C, NP   10 mg at 11/17/23 1224   clopidogrel (PLAVIX) tablet 75 mg  75 mg Oral Daily Dahlia Byes C, NP   75 mg at 11/17/23 7829   haloperidol (HALDOL) tablet 5 mg  5 mg Oral TID PRN Earney Navy, NP       And   diphenhydrAMINE (BENADRYL) capsule 50 mg  50 mg Oral TID PRN Dahlia Byes C, NP       haloperidol lactate (HALDOL) injection 5 mg  5 mg Intramuscular TID PRN Dahlia Byes C, NP       And   diphenhydrAMINE (BENADRYL) injection 50 mg  50 mg Intramuscular TID PRN Dahlia Byes C, NP       And   LORazepam (ATIVAN) injection 2 mg  2 mg Intramuscular TID PRN Dahlia Byes C, NP       haloperidol lactate (HALDOL) injection 10 mg  10 mg Intramuscular TID PRN Dahlia Byes C, NP       And   diphenhydrAMINE (BENADRYL) injection 50 mg  50 mg Intramuscular TID PRN Earney Navy, NP       And   LORazepam (ATIVAN) injection 2 mg  2 mg Intramuscular TID PRN Earney Navy, NP       folic acid (FOLVITE) tablet 1 mg  1 mg Oral Daily Dahlia Byes C, NP   1 mg at 11/17/23 0817   gabapentin (NEURONTIN) capsule 600 mg  600 mg Oral TID  Dahlia Byes C, NP   600 mg at 11/17/23 1224   hydrOXYzine (ATARAX) tablet 25 mg  25 mg Oral TID PRN Dahlia Byes C, NP   25 mg at 11/17/23 1031   lamoTRIgine (LAMICTAL) tablet 200 mg  200 mg Oral Daily Dahlia Byes C, NP   200 mg at 11/17/23 0817   lip balm (CARMEX) ointment 1 Application  1 Application Topical PRN Dahlia Byes C, NP       magnesium hydroxide (MILK OF MAGNESIA) suspension 30 mL  30 mL Oral Daily PRN Welford Roche, Josephine C, NP       naloxone (NARCAN) nasal spray 4 mg/0.1 mL  1 spray Nasal PRN Onuoha, Josephine C, NP       naproxen (NAPROSYN) tablet 250 mg  250 mg Oral BID WC Verner Chol, MD       nicotine (NICODERM CQ - dosed in mg/24 hours) patch 21 mg  21 mg Transdermal Daily Verner Chol, MD   21 mg at 11/17/23 1033   nicotine polacrilex (NICORETTE) gum 2 mg  2 mg Oral PRN Verner Chol, MD   2 mg at 11/17/23 1031   polyvinyl  alcohol (LIQUIFILM TEARS) 1.4 % ophthalmic solution 1 drop  1 drop Both Eyes QID PRN Dahlia Byes C, NP       QUEtiapine (SEROQUEL) tablet 200 mg  200 mg Oral QHS Onuoha, Josephine C, NP   200 mg at 11/16/23 2049   rOPINIRole (REQUIP) tablet 2 mg  2 mg Oral Q2000 Onuoha, Josephine C, NP   2 mg at 11/16/23 2048   sertraline (ZOLOFT) tablet 50 mg  50 mg Oral Daily Dahlia Byes C, NP   50 mg at 11/17/23 7829   traZODone (DESYREL) tablet 50 mg  50 mg Oral QHS Dahlia Byes C, NP   50 mg at 11/16/23 2048   Facility-Administered Medications Ordered in Other Encounters  Medication Dose Route Frequency Provider Last Rate Last Admin   naproxen (NAPROSYN) tablet 375 mg  375 mg Oral TID WC Verne Spurr T, PA-C       PTA Medications: Medications Prior to Admission  Medication Sig Dispense Refill Last Dose/Taking   acetaminophen (TYLENOL) 500 MG tablet Take 1,000 mg by mouth See admin instructions. Take 1,000 mg by mouth every 6-8 hours and cannot exceed a sum total of 4,000 mg/24 hours from ALL combined sources       aspirin EC 81 MG tablet Take 81 mg by mouth daily.      busPIRone (BUSPAR) 10 MG tablet Take 1 tablet (10 mg total) by mouth 3 (three) times daily. 90 tablet 0    clopidogrel (PLAVIX) 75 MG tablet Take 1 tablet (75 mg total) by mouth daily. 30 tablet 0    folic acid (FOLVITE) 1 MG tablet Take 1 tablet (1 mg total) by mouth daily. (Patient not taking: Reported on 11/16/2023)      gabapentin (NEURONTIN) 600 MG tablet Take 600 mg by mouth 3 (three) times daily.      hydrOXYzine (ATARAX) 25 MG tablet Take 1 tablet (25 mg total) by mouth 3 (three) times daily as needed for anxiety. (Patient not taking: Reported on 11/16/2023) 30 tablet 0    lamoTRIgine (LAMICTAL) 200 MG tablet Take 1 tablet (200 mg total) by mouth daily. 30 tablet 0    lip balm (CARMEX) ointment Apply 1 Application topically as needed (for dry lips).      naloxone (NARCAN) nasal spray 4 mg/0.1 mL Place 1 spray into the nose as needed (AS DIRECTED BY MD).      oxyCODONE-acetaminophen (PERCOCET/ROXICET) 5-325 MG tablet Take 1 tablet by mouth every 8 (eight) hours as needed for severe pain (pain score 7-10).      QUEtiapine (SEROQUEL) 200 MG tablet Take 1 tablet (200 mg total) by mouth at bedtime. 30 tablet 0    REFRESH TEARS PF 0.5-0.9 % SOLN Place 1 drop into both eyes 4 (four) times daily as needed (for dryness).      rOPINIRole (REQUIP) 2 MG tablet Take 1 tablet (2 mg total) by mouth daily at 8 pm. 14 tablet 0    sertraline (ZOLOFT) 50 MG tablet Take 1 tablet (50 mg total) by mouth daily. 30 tablet 0    traZODone (DESYREL) 50 MG tablet Take 1 tablet (50 mg total) by mouth at bedtime. 14 tablet 0     Patient Stressors:    Patient Strengths:    Treatment Modalities: Medication Management, Group therapy, Case management,  1 to 1 session with clinician, Psychoeducation, Recreational therapy.   Physician Treatment Plan for Primary Diagnosis: Bipolar 2 disorder, major depressive episode (HCC) Long Term Goal(s):  Short Term Goals:     Medication Management: Evaluate patient's response, side effects, and tolerance of medication regimen.  Therapeutic Interventions: 1 to 1 sessions, Unit Group sessions and Medication administration.  Evaluation of Outcomes: Not Met  Physician Treatment Plan for Secondary Diagnosis: Principal Problem:   Bipolar 2 disorder, major depressive episode (HCC)  Long Term Goal(s):     Short Term Goals:       Medication Management: Evaluate patient's response, side effects, and tolerance of medication regimen.  Therapeutic Interventions: 1 to 1 sessions, Unit Group sessions and Medication administration.  Evaluation of Outcomes: Not Met   RN Treatment Plan for Primary Diagnosis: Bipolar 2 disorder, major depressive episode (HCC) Long Term Goal(s): Knowledge of disease and therapeutic regimen to maintain health will improve  Short Term Goals: Ability to remain free from injury will improve, Ability to verbalize frustration and anger appropriately will improve, Ability to demonstrate self-control, Ability to participate in decision making will improve, Ability to verbalize feelings will improve, Ability to disclose and discuss suicidal ideas, and Ability to identify and develop effective coping behaviors will improve  Medication Management: RN will administer medications as ordered by provider, will assess and evaluate patient's response and provide education to patient for prescribed medication. RN will report any adverse and/or side effects to prescribing provider.  Therapeutic Interventions: 1 on 1 counseling sessions, Psychoeducation, Medication administration, Evaluate responses to treatment, Monitor vital signs and CBGs as ordered, Perform/monitor CIWA, COWS, AIMS and Fall Risk screenings as ordered, Perform wound care treatments as ordered.  Evaluation of Outcomes: Not Met   LCSW Treatment Plan for Primary Diagnosis: Bipolar 2 disorder, major depressive episode (HCC) Long Term  Goal(s): Safe transition to appropriate next level of care at discharge, Engage patient in therapeutic group addressing interpersonal concerns.  Short Term Goals: Engage patient in aftercare planning with referrals and resources, Increase social support, Increase ability to appropriately verbalize feelings, Increase emotional regulation, Facilitate acceptance of mental health diagnosis and concerns, Facilitate patient progression through stages of change regarding substance use diagnoses and concerns, Identify triggers associated with mental health/substance abuse issues, and Increase skills for wellness and recovery  Therapeutic Interventions: Assess for all discharge needs, 1 to 1 time with Social worker, Explore available resources and support systems, Assess for adequacy in community support network, Educate family and significant other(s) on suicide prevention, Complete Psychosocial Assessment, Interpersonal group therapy.  Evaluation of Outcomes: Not Met   Progress in Treatment: Attending groups: Yes. Participating in groups: Yes. Taking medication as prescribed: Yes. Toleration medication: Yes. Family/Significant other contact made: No, will contact:  CSW to contact once permission is granted.  Patient understands diagnosis: Yes. Discussing patient identified problems/goals with staff: Yes. Medical problems stabilized or resolved: Yes. Denies suicidal/homicidal ideation: Yes. Issues/concerns per patient self-inventory: No. Other: None  New problem(s) identified: Yes, Describe:  Patient reports that she recently relapsed in December. Patient reports constant body pain.   New Short Term/Long Term Goal(s): detox, elimination of symptoms of psychosis, medication management for mood stabilization; elimination of SI thoughts; development of comprehensive mental wellness/sobriety plan.    Patient Goals:  "I'm thinking about something long term treatment for mental health and pain  control."  Discharge Plan or Barriers: CSW to assist in the development of appropriate discharge planning.   Reason for Continuation of Hospitalization: Aggression Anxiety Delusions  Depression Hallucinations Man[facility] Medical Issues Suicidal ideation  Estimated Length of Stay: 1-7 days.   Last 3 [place] Suicide Severity Risk Score: Flowsheet Row Admission (  Current) from 11/16/20[facility] in [facility] ED from 11/15/2023 in [facility] Emergency Department [facility]) [facility]  C-SSRS RISK CATEGORY Low Risk High Risk High Risk       Last PHQ 2/9 Scores:    11/20/2022    9:00 AM 06/02/2017    1:59 PM 02/24/2016    2:54 PM  Depression screen PHQ 2/9  Decreased Interest 3 0 0  Down, Depressed, Hopeless 3 0 0  PHQ - 2 Score 6 0 0  Altered sleeping 3    Tired, decreased energy 3    Change in appetite 3    Feeling bad or failure about yourself  3    Trouble concentrating 3    Moving slowly or fidgety/restless 3    Suicidal thoughts 3    PHQ-9 Score 27    Difficult doing work/chores Very difficult      Scribe for Treatment Team: Lowry Ram, LCSW 11/17/2023 12:30 PM

## 2023-11-17 NOTE — Progress Notes (Signed)
   11/17/23 1345  Spiritual Encounters  Type of Visit Initial  Care provided to: Patient  Conversation partners present during encounter Social worker/Care management/TOC  Reason for visit Routine spiritual support  OnCall Visit No   Patient requested Chaplain visit while Chaplain was rounding on the Unit.  Patient shared feelings of shame because of the past and decisions that have impacted relationships.  Patient also managing addiction and feelings related to that. Chaplain provided a compassionate presence and reflective listening.  Chaplain offered encouragement and prayer.  Patient requested a Bible and Chaplain will provide.    Rev. Rana M. Earlene Plater, M.Div. Chaplain Resident [facility]

## 2023-11-17 NOTE — Progress Notes (Signed)
   11/17/23 2000  Psych Admission Type (Psych Patients Only)  Admission Status Involuntary  Psychosocial Assessment  Patient Complaints Anxiety;Irritability  Eye Contact Brief  Facial Expression Anxious  Affect Anxious  Speech Logical/coherent  Interaction Guarded  Motor Activity Fidgety  Appearance/Hygiene Unremarkable  Behavior Characteristics Restless  Mood Anxious  Thought Process  Coherency WDL  Content Preoccupation  Delusions None reported or observed  Perception WDL  Hallucination None reported or observed  Judgment WDL  Confusion None  Danger to Self  Current suicidal ideation? Denies  Agreement Not to Harm Self Yes  Description of Agreement verbal  Danger to Others  Danger to Others None reported or observed   Patient with complaints of pain to foot, prn given for good relief. Denies SI, HI, AVH. Complains of anxiety. Prn given with relief. Ambulates independently in w/c. Visible in milieu. Appropriate with staff and peers. Encouragement and support provided. Safety checks maintained. Medications given as prescribed. Pt receptive and remains safe on unit with q 15 min checks.

## 2023-11-17 NOTE — Progress Notes (Signed)
   11/17/23 0200  Psych Admission Type (Psych Patients Only)  Admission Status Involuntary  Psychosocial Assessment  Patient Complaints Sadness;Depression;Anxiety  Eye Contact Brief  Facial Expression Anxious  Affect Anxious  Speech Logical/coherent  Interaction Guarded  Motor Activity Fidgety  Appearance/Hygiene Unremarkable  Behavior Characteristics Restless  Mood Anxious  Thought Process  Coherency WDL  Content Preoccupation  Delusions None reported or observed  Perception WDL  Judgment Impaired  Confusion None  Danger to Self  Current suicidal ideation? Denies  Agreement Not to Harm Self Yes  Description of Agreement Verbal  Danger to Others  Danger to Others None reported or observed

## 2023-11-17 NOTE — Plan of Care (Signed)
  Problem: Education: Goal: Verbalization of understanding the information provided will improve Outcome: Not Progressing   

## 2023-11-17 NOTE — Group Note (Signed)
 LCSW Group Therapy Note   Group Date: 11/17/2023 Start Time: 1300 End Time: 1422   Type of Therapy and Topic:  Group Therapy: Challenging Core Beliefs  Participation Level:  Active  Description of Group:  Patients were educated about core beliefs and asked to identify one harmful core belief that they have. Patients were asked to explore from where those beliefs originate. Patients were asked to discuss how those beliefs make them feel and the resulting behaviors of those beliefs. They were then be asked if those beliefs are true and, if so, what evidence they have to support them. Lastly, group members were challenged to replace those negative core beliefs with helpful beliefs.   Therapeutic Goals:   1. Patient will identify harmful core beliefs and explore the origins of such beliefs. 2. Patient will identify feelings and behaviors that result from those core beliefs. 3. Patient will discuss whether such beliefs are true. 4.  Patient will replace harmful core beliefs with helpful ones.  Summary of Patient Progress:  Patient actively engaged in processing and exploring how core beliefs are formed and how they impact thoughts, feelings, and behaviors. Patient proved open to input from peers and feedback from CSW. Patient demonstrated proficient insight into the subject matter, was respectful and supportive of peers, and participated throughout the entire session.  Therapeutic Modalities: Cognitive Behavioral Therapy; Solution-Focused Therapy   Perrin Smack 11/17/2023  2:41 PM

## 2023-11-17 NOTE — Progress Notes (Signed)
   11/17/23 0817  Psych Admission Type (Psych Patients Only)  Admission Status Involuntary  Psychosocial Assessment  Patient Complaints Sadness;Depression;Anxiety  Eye Contact Brief  Facial Expression Anxious  Affect Anxious  Speech Logical/coherent  Interaction Guarded  Motor Activity Fidgety  Appearance/Hygiene Unremarkable  Behavior Characteristics Restless  Mood Anxious  Thought Process  Coherency WDL  Content Preoccupation  Delusions None reported or observed  Perception WDL  Hallucination None reported or observed  Judgment WDL  Confusion None  Danger to Self  Current suicidal ideation? Denies  Agreement Not to Harm Self Yes  Description of Agreement verbal  Danger to Others  Danger to Others None reported or observed   D: Patient is alert and oriented. She reports experiencing anxiety and  depression 6/10 at the time of assessment, as well as generalized and right leg pain 7/10. She denies any suicidal or homicidal ideation, or auditory or visual hallucinations. Patient was verbal abuse towards [facility] staff w/o provocation, required multiple verbal redirection to calm down. Increase in agitation, cursing, and anxiety noted throughout this shift. Patient reported she unintentionally, "I got water in my cast while taking a shower." She continues to demand that, "you'll got to make sure my cast is changed tonight." Patient had adequate meal and fluid intake this shift,  have spent most of the shift in her wheelchair, with no injury reported or noted.    A: Scheduled medications were administered per provider orders. Hydroxyzine 25 mg po prn offered at 1031 and 1643 for c/o anxiety and agitation. Patient encouraged to refrain from causing/verbal abuse towards staff. Tylenol 650 mg po prn offered at 0815 and 1330 for c/o of moderate generalized pain. Support and encouragement were provided, with frequent verbal contact. Routine safety checks conducted every 15 minutes.  R: No adverse  drug reactions were observed. Patient reported some relief from offered Prns. Patient is agreeable to notifying staff of any safety concerns and remains compliant with medications. She interact minimally with peers on the unit,  she remains safe at this time. The plan of care is ongoing.

## 2023-11-17 NOTE — Progress Notes (Signed)
   11/17/23 1800  Spiritual Encounters  Type of Visit Initial  Care provided to: Patient  Referral source Chaplain team  Reason for visit Routine spiritual support (Pt requested a Bible)  OnCall Visit Yes  Spiritual Framework  Presenting Themes Meaning/purpose/sources of inspiration;Goals in life/care;Coping tools;Impactful experiences and emotions;Courage hope and growth;Rituals and practive;Other (comment) (Pt was upset because she got her cast wet in the shower. I checked w/her nurse and had permission to let her know her nurse was contacting her doctor to see what can be done.)  Patient Stress Factors Major life changes  Interventions  Spiritual Care Interventions Made Established relationship of care and support;Compassionate presence;Reflective listening;Reconciliation with self/others;Narrative/life review;Explored values/beliefs/practices/strengths;Prayer;Encouragement  Intervention Outcomes  Outcomes Connection to spiritual care;Awareness around self/spiritual resourses;Connection to values and goals of care;Awareness of support;Reduced anxiety;Other (comment) (Pt recognized she was reactive because she was afraid. We worked on some ways she could help herself keep calm.)

## 2023-11-17 NOTE — Plan of Care (Signed)
   Problem: Education: Goal: Emotional status will improve Outcome: Progressing Goal: Mental status will improve Outcome: Progressing Goal: Verbalization of understanding the information provided will improve Outcome: Progressing
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
## 2023-11-17 NOTE — Group Note (Signed)
 Date:  11/17/2023 Time:  9:22 PM  Group Topic/Focus:  Wrap-Up Group:   The focus of this group is to help patients review their daily goal of treatment and discuss progress on daily workbooks.    Participation Level:  Active  Participation Quality:  Appropriate  Affect:  Appropriate  Cognitive:  Appropriate  Insight: Appropriate  Engagement in Group:  Engaged  Modes of Intervention:  Discussion  Lenore Cordia 11/17/2023, 9:22 PM
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
## 2023-11-17 NOTE — [facility] Suicide Risk Assessment (Signed)
 [facility] Admission Suicide Risk Assessment   Nursing information obtained from:    Demographic factors:  Caucasian Current Mental Status:  NA Loss Factors:  Decrease in vocational status, Decline in physical health Historical Factors:  Impulsivity Risk Reduction Factors:  Sense of responsibility to family  Total Time spent with patient: 30 minutes Principal Problem: Bipolar 2 disorder, major depressive episode (HCC) Diagnosis:  Principal Problem:   Bipolar 2 disorder, major depressive episode (HCC)  Subjective Data: [name] is a 43 year old female with a history of depression, borderline personality disorder, PTSD, bipolar II disorder, alcohol use disorder, stimulant use disorder, chronic pain, discharged from psychiatric hospitalization 3/31, who presents to the ED with suicidal ideation. Chart reviewed, ethanol 38, UDS negative. Psychiatry consulted for disposition recommendations. On evaluation, patient noted to be tearful, dysphoric, linear, not appearing internally preoccupied, not responding to internal stimuli, alert and oriented x 4. Patient endorses active suicidal ideation with intent and plan to overdose on medication.   Continued Clinical Symptoms:  Alcohol Use Disorder Identification Test Final Score (AUDIT): 14 The "Alcohol Use Disorders Identification Test", Guidelines for Use in Primary Care, Second Edition.  World Science writer [facility]). Score between 0-7:  no or low risk or alcohol related problems. Score between 8-15:  moderate risk of alcohol related problems. Score between 16-19:  high risk of alcohol related problems. Score 20 or above:  warrants further diagnostic evaluation for alcohol dependence and treatment.   CLINICAL FACTORS:   Depression:   Impulsivity Previous Psychiatric Diagnoses and Treatments Medical Diagnoses and Treatments/Surgeries   Musculoskeletal: Strength & Muscle Tone: abnormal Gait & Station: unsteady Patient leans:  N/A  Psychiatric Specialty Exam:  Presentation  General Appearance:  Appropriate for Environment; Casual  Eye Contact: Fair  Speech: Clear and Coherent  Speech Volume: Normal  Handedness: Right   Mood and Affect  Mood: Dysphoric; Anxious  Affect: Depressed; Labile   Thought Process  Thought Processes: Coherent  Descriptions of Associations:Intact  Orientation:Full (Time, Place and Person)  Thought Content:Illogical  History of Schizophrenia/Schizoaffective disorder:No  Duration of Psychotic Symptoms:No data recorded Hallucinations:Hallucinations: None  Ideas of Reference:None  Suicidal Thoughts:Suicidal Thoughts: Yes, Passive SI Passive Intent and/or Plan: Without Intent; Without Plan  Homicidal Thoughts:Homicidal Thoughts: No   Sensorium  Memory: Immediate Fair; Recent Fair; Remote Fair  Judgment: Impaired  Insight: Shallow   Executive Functions  Concentration: Fair  Attention Span: Fair  Recall: [employer] of Knowledge: Fair  Language: Fair   Psychomotor Activity  Psychomotor Activity: Psychomotor Activity: Normal   Assets  Assets: Communication Skills; Desire for Improvement; Resilience   Sleep  Sleep: Sleep: Fair    Physical Exam: Physical Exam ROS Blood pressure 129/85, pulse (!) 103, temperature (!) 97.3 F (36.3 C), resp. rate 20, last menstrual period 11/07/2023, SpO2 97%. There is no height or weight on file to calculate BMI.   COGNITIVE FEATURES THAT CONTRIBUTE TO RISK:  None    SUICIDE RISK:   Moderate:  Frequent suicidal ideation with limited intensity, and duration, some specificity in terms of plans, no associated intent, good self-control, limited dysphoria/symptomatology, some risk factors present, and identifiable protective factors, including available and accessible social support.  PLAN OF CARE: Patient is admitted to [facility] psych unit with Q15 min safety monitoring. Multidisciplinary team  approach is offered. Medication management; group/milieu therapy is offered.   I certify that inpatient services furnished can reasonably be expected to improve the patient's condition.   Verner Chol, MD 11/17/2023, 4:45 PM

## 2023-11-18 DIAGNOSIS — F3181 Bipolar II disorder: Principal | ICD-10-CM

## 2023-11-18 MED ORDER — HYDROXYZINE HCL 50 MG PO TABS
50.0000 mg | ORAL_TABLET | Freq: Three times a day (TID) | ORAL | Status: DC | PRN
  Administered 2023-11-19 – 2023-11-22 (×10): 50 mg via ORAL
  Filled 2023-11-18 (×10): qty 1

## 2023-11-18 MED ORDER — HYDROXYZINE HCL 50 MG PO TABS
50.0000 mg | ORAL_TABLET | Freq: Once | ORAL | Status: AC
  Administered 2023-11-18: 50 mg via ORAL
  Filled 2023-11-18: qty 1

## 2023-11-18 NOTE — Progress Notes (Signed)
 Pt was agitated related to another patient's comments on the unit.  Pt was tearful and anxious.  Pt requested medication for agitation and was given PO meds for mild agitation per protocol.  Continued monitoring for safety.    11/18/23 2300  Psych Admission Type (Psych Patients Only)  Admission Status Involuntary  Psychosocial Assessment  Patient Complaints Agitation  Eye Contact Fair  Facial Expression Anxious  Affect Labile  Speech Logical/coherent  Interaction Assertive  Motor Activity Slow  Appearance/Hygiene Unremarkable  Behavior Characteristics Cooperative;Agitated  Mood Labile  Thought Process  Coherency WDL  Content Blaming others  Delusions None reported or observed  Perception WDL  Hallucination None reported or observed  Judgment Poor  Confusion None  Danger to Self  Current suicidal ideation? Denies  Description of Suicide Plan n/a  Agreement Not to Harm Self No  Danger to Others  Danger to Others None reported or observed

## 2023-11-18 NOTE — Group Note (Signed)
 LCSW Group Therapy Note  Group Date: 11/18/2023 Start Time: 1300 End Time: 1350   Type of Therapy and Topic:  Group Therapy: Anger Cues and Responses  Participation Level:  Active   Description of Group:   In this group, patients learned how to recognize the physical, cognitive, emotional, and behavioral responses they have to anger-provoking situations.  They identified a recent time they became angry and how they reacted.  They analyzed how their reaction was possibly beneficial and how it was possibly unhelpful.  The group discussed a variety of healthier coping skills that could help with such a situation in the future.  Focus was placed on how helpful it is to recognize the underlying emotions to our anger, because working on those can lead to a more permanent solution as well as our ability to focus on the important rather than the urgent.  Therapeutic Goals: Patients will remember their last incident of anger and how they felt emotionally and physically, what their thoughts were at the time, and how they behaved. Patients will identify how their behavior at that time worked for them, as well as how it worked against them. Patients will explore possible new behaviors to use in future anger situations. Patients will learn that anger itself is normal and cannot be eliminated, and that healthier reactions can assist with resolving conflict rather than worsening situations.  Summary of Patient Progress:   Patient was active during the group. She shared a recent occurrence wherein feeling vulnerable led to anger. She demonstrated fair insight into the subject matter, was respectful of peers, and participated throughout the entire session.  Patient reports that she would like to work on decreasing her anger by increasing her coping skills.   Therapeutic Modalities:   Cognitive Behavioral Therapy    [name], LCSW 11/18/2023  1:55 PM

## 2023-11-18 NOTE — Group Note (Signed)
 Date:  11/18/2023 Time:  9:50 AM  Group Topic/Focus:  Self Care:   The focus of this group is to help patients understand the importance of self-care in order to improve or restore emotional, physical, spiritual, interpersonal, and financial health.    Participation Level:  Did Not Attend   [name] Sella Nyeem Stoke 11/18/2023, 9:50 AM

## 2023-11-18 NOTE — Progress Notes (Signed)
   11/18/23 1215  Spiritual Encounters  Type of Visit Follow up  Care provided to: Patient  Conversation partners present during encounter Nurse  Reason for visit Routine spiritual support  OnCall Visit No   Chaplain followed-up with patient from visit yesterday.  Colleague visited with patient last night and brought Bible.  Patient was happy with the visit provided by the Chaplain's colleague.  Patient shared frustrations and Chaplain offered encouragement and presence.  Chaplain and patient prayed together as well.    Rev. Rana M. Earlene Plater, M.Div. Chaplain Resident [facility]

## 2023-11-18 NOTE — Group Note (Signed)
 Date:  11/18/2023 Time:  10:46 PM  Group Topic/Focus:  Relapse Prevention Planning:   The focus of this group is to define relapse and discuss the need for planning to combat relapse.    Participation Level:  Active  Participation Quality:  Redirectable  Affect:  Defensive and Irritable  Cognitive:  Alert  Insight: Appropriate  Engagement in Group:  Distracting  Modes of Intervention:  Confrontation and Discussion  Additional Comments:  Patient present under normal limits and was irritable throughout group. Patient made an attempt to verbally target a peer and had to be redirected throughout group. However, once group rules were restated the patient was able to understand  the importance of supporting one another. Patient was able to identify triggers which caused her to use, and through peer support, identify healthy coping strategies to combat negative emotional triggers.  Lorenda Ishihara 11/18/2023, 10:46 PM

## 2023-11-18 NOTE — Progress Notes (Signed)
 Ortho was consulted for possible cast placement.  Patient was admitted to [facility], she is s/p ORIF of multiple tarosmetatarsal joint fracture-dislocations in addition to percutaneous pinning, pins are still in place.  She got her cast wet yesterday.  Patient has been NWB to the leg.  Cast was removed today.  Skin demonstrated not signs of infection.  Pins intact without erythema or purulent drainage.  Pins were cleaned before a new dry dressing was applied over the pin sites and a new short leg gas was placed with the ankle in neutral position.  She will continue NWB to the right leg.  Follow-up with orthopaedic provider who performed surgery at scheduled post-op appointment in two weeks as planned.  Haley Batman, PA-C, CAQ-OS [facility]

## 2023-11-18 NOTE — Progress Notes (Signed)
 [facility] MD Progress Note  11/18/2023 8:33 PM VERDIS BASSETTE  MRN:  [id number] [name] is a 43 year old female with a history of depression, borderline personality disorder, PTSD, bipolar II disorder, alcohol use disorder, stimulant use disorder, chronic pain, discharged from psychiatric hospitalization 3/31, who presents to the ED with suicidal ideation. Chart reviewed, ethanol 38, UDS negative. Psychiatry consulted for disposition recommendations. On evaluation, patient noted to be tearful, dysphoric, linear, not appearing internally preoccupied, not responding to internal stimuli, alert and oriented x 4. Patient endorses active suicidal ideation with intent and plan to overdose on medication. Patient is admitted to [facility] unit with Q15 min safety monitoring. Multidisciplinary team approach is offered. Medication management; group/milieu therapy is offered.   Subjective:  Chart reviewed, case discussed in multidisciplinary meeting, patient seen during rounds.  Patient is noted to be sitting in her room.  Orthopedic doctor came today morning and change in her cast.  She remains discharge focused and wants to know when she can go home.  She expressed her frustration about one of the staff members being inappropriate with the peers.  She reports that she is feeling a lot better.  Provider discussed long-term rehab but patient declined all services at this time.  Patient is taking her medications with no problems.  Provider explained that her husband will be contacted and depending on the safety concerns discharge will be planned accordingly.  Patient denies SI/HI/intent/plan.  Patient denies auditory/visual hallucinations.   Sleep: Fair  Appetite:  Fair  Past Psychiatric History: see h&P Family History:  Family History  Problem Relation Age of Onset   Cancer Father        prostate   Cancer Sister        bladder   Lung disease Mother    Cancer Maternal Uncle        Lung   Cancer Paternal  Aunt        Breast   Cancer Maternal Grandfather    Cancer Paternal Grandfather    Cancer Paternal Aunt        Breast   Social History:  Social History   Substance and Sexual Activity  Alcohol Use Yes   Comment: 1 beer 11/15/2023     Social History   Substance and Sexual Activity  Drug Use Yes   Types: Marijuana   Comment: pt denies (there is documented hx cannibus use)    Social History   Socioeconomic History   Marital status: Married    Spouse name: Not on file   Number of children: Not on file   Years of education: Not on file   Highest education level: Not on file  Occupational History   Not on file  Tobacco Use   Smoking status: Every Day    Current packs/day: 0.50    Average packs/day: 0.5 packs/day for 15.0 years (7.5 ttl pk-yrs)    Types: Cigarettes   Smokeless tobacco: Never  Vaping Use   Vaping status: Never Used  Substance and Sexual Activity   Alcohol use: Yes    Comment: 1 beer 11/15/2023   Drug use: Yes    Types: Marijuana    Comment: pt denies (there is documented hx cannibus use)   Sexual activity: Yes    Birth control/protection: Surgical  Other Topics Concern   Not on file  Social History Narrative   Pt also has hx of an aunt and uncle with lung cancer.  Social Drivers of Corporate investment banker Strain: Not on file  Food Insecurity: No Food Insecurity (11/16/2023)   Hunger Vital Sign    Worried About Running Out of Food in the Last Year: Never true    Ran Out of Food in the Last Year: Never true  Recent Concern: Food Insecurity - Food Insecurity Present (11/10/2023)   Hunger Vital Sign    Worried About Running Out of Food in the Last Year: Sometimes true    Ran Out of Food in the Last Year: Sometimes true  Transportation Needs: No Transportation Needs (11/16/2023)   PRAPARE - Administrator, Civil Service (Medical): No    Lack of Transportation (Non-Medical): No  Physical Activity: Not on file  Stress: Not on  file  Social Connections: Unknown (12/28/2021)   Received from [facility], [facility]   Social Network    Social Network: Not on file   Past Medical History:  Past Medical History:  Diagnosis Date   Alcohol abuse    Borderline personality disorder (HCC) 11/15/2023   H/O psychiatric hospitalization 11/15/2023   History of cervical dysplasia    CIN I  in  2004   Inflamed external hemorrhoid    Inflamed internal hemorrhoid    Major depression    PTSD (post-traumatic stress disorder)    Rectal pain    Scoliosis 11/12/2012   Suicide attempt (HCC) 11/15/2023    Past Surgical History:  Procedure Laterality Date   APPENDECTOMY  08/17/1992   CERVICAL BIOPSY  W/ LOOP ELECTRODE EXCISION  06/11/2003   EVALUATION UNDER ANESTHESIA WITH HEMORRHOIDECTOMY N/A 01/22/2014   Procedure: EXAM UNDER ANESTHESIA WITH HEMORRHOIDECTOMY;  Surgeon: Romie Levee, MD;  Location: [facility];  Service: General;  Laterality: N/A;   FOOT SURGERY Right    KNEE SURGERY Bilateral    PELVIC FRACTURE SURGERY     TUBAL LIGATION  05/08/2005   W/  FILSHIE CLIPS    Current Medications: Current Facility-Administered Medications  Medication Dose Route Frequency Provider Last Rate Last Admin   acetaminophen (TYLENOL) tablet 650 mg  650 mg Oral Q6H PRN Verner Chol, MD   650 mg at 11/18/23 1758   alum & mag hydroxide-simeth (MAALOX/MYLANTA) 200-200-20 MG/5ML suspension 30 mL  30 mL Oral Q4H PRN Earney Navy, NP       aspirin EC tablet 81 mg  81 mg Oral Daily Dahlia Byes C, NP   81 mg at 11/18/23 6045   busPIRone (BUSPAR) tablet 10 mg  10 mg Oral TID Dahlia Byes C, NP   10 mg at 11/18/23 1758   clopidogrel (PLAVIX) tablet 75 mg  75 mg Oral Daily Dahlia Byes C, NP   75 mg at 11/18/23 4098   haloperidol (HALDOL) tablet 5 mg  5 mg Oral TID PRN Earney Navy, NP       And   diphenhydrAMINE (BENADRYL) capsule 50 mg  50 mg Oral TID PRN Dahlia Byes C, NP        haloperidol lactate (HALDOL) injection 5 mg  5 mg Intramuscular TID PRN Dahlia Byes C, NP       And   diphenhydrAMINE (BENADRYL) injection 50 mg  50 mg Intramuscular TID PRN Dahlia Byes C, NP       And   LORazepam (ATIVAN) injection 2 mg  2 mg Intramuscular TID PRN Dahlia Byes C, NP       haloperidol lactate (HALDOL) injection 10 mg  10 mg Intramuscular TID  PRN Earney Navy, NP       And   diphenhydrAMINE (BENADRYL) injection 50 mg  50 mg Intramuscular TID PRN Dahlia Byes C, NP       And   LORazepam (ATIVAN) injection 2 mg  2 mg Intramuscular TID PRN Earney Navy, NP       folic acid (FOLVITE) tablet 1 mg  1 mg Oral Daily Dahlia Byes C, NP   1 mg at 11/18/23 7846   gabapentin (NEURONTIN) capsule 600 mg  600 mg Oral TID Dahlia Byes C, NP   600 mg at 11/18/23 1757   hydrOXYzine (ATARAX) tablet 50 mg  50 mg Oral TID PRN Verner Chol, MD       ibuprofen (ADVIL) tablet 800 mg  800 mg Oral Q6H PRN Verner Chol, MD   800 mg at 11/18/23 1228   lamoTRIgine (LAMICTAL) tablet 200 mg  200 mg Oral Daily Dahlia Byes C, NP   200 mg at 11/18/23 9629   lip balm (CARMEX) ointment 1 Application  1 Application Topical PRN Dahlia Byes C, NP       magnesium hydroxide (MILK OF MAGNESIA) suspension 30 mL  30 mL Oral Daily PRN Dahlia Byes C, NP       naloxone (NARCAN) nasal spray 4 mg/0.1 mL  1 spray Nasal PRN Dahlia Byes C, NP       naproxen (NAPROSYN) tablet 250 mg  250 mg Oral BID WC Verner Chol, MD   250 mg at 11/18/23 1758   nicotine (NICODERM CQ - dosed in mg/24 hours) patch 21 mg  21 mg Transdermal Daily Verner Chol, MD   21 mg at 11/18/23 5284   nicotine polacrilex (NICORETTE) gum 2 mg  2 mg Oral PRN Verner Chol, MD   2 mg at 11/17/23 1031   polyvinyl alcohol (LIQUIFILM TEARS) 1.4 % ophthalmic solution 1 drop  1 drop Both Eyes QID PRN Dahlia Byes C, NP       QUEtiapine (SEROQUEL) tablet 200 mg  200 mg Oral QHS  Onuoha, Josephine C, NP   200 mg at 11/17/23 2121   rOPINIRole (REQUIP) tablet 2 mg  2 mg Oral Q2000 Onuoha, Josephine C, NP   2 mg at 11/17/23 2122   sertraline (ZOLOFT) tablet 50 mg  50 mg Oral Daily Dahlia Byes C, NP   50 mg at 11/18/23 1324   traZODone (DESYREL) tablet 50 mg  50 mg Oral QHS Dahlia Byes C, NP   50 mg at 11/17/23 2122    Lab Results: No results found for this or any previous visit (from the past 48 hours).  Blood Alcohol level:  Lab Results  Component Value Date   ETH 38 (H) 11/15/2023   ETH 106 (H) 11/08/2023    Metabolic Disorder Labs: Lab Results  Component Value Date   HGBA1C 5.2 11/19/2022   MPG 103 11/19/2022   MPG 103 01/30/2020   No results found for: "PROLACTIN" Lab Results  Component Value Date   CHOL 186 11/19/2022   TRIG 176 (H) 11/19/2022   HDL 45 11/19/2022   CHOLHDL 4.1 11/19/2022   VLDL 35 11/19/2022   LDLCALC 106 (H) 11/19/2022   LDLCALC 82 01/30/2020    Physical Findings: AIMS:  , ,  ,  ,    CIWA:    COWS:      Psychiatric Specialty Exam:  Presentation  General Appearance:  Appropriate for Environment; Casual  Eye Contact: Fair  Speech: Clear and Coherent  Speech  Volume: Normal    Mood and Affect  Mood: Irritable; Anxious  Affect: Appropriate   Thought Process  Thought Processes: Coherent  Descriptions of Associations:Intact  Orientation:Full (Time, Place and Person)  Thought Content:Logical  Hallucinations:Hallucinations: None  Ideas of Reference:None  Suicidal Thoughts:Suicidal Thoughts: No SI Passive Intent and/or Plan: Without Intent; Without Plan  Homicidal Thoughts:Homicidal Thoughts: No   Sensorium  Memory: Immediate Fair; Recent Fair; Remote Fair  Judgment: Impaired  Insight: Shallow   Executive Functions  Concentration: Fair  Attention Span: Fair  Recall: Fair  Fund of Knowledge: Fair  Language: Fair   Psychomotor Activity  Psychomotor  Activity: Psychomotor Activity: Normal  Musculoskeletal: Strength & Muscle Tone: within normal limits Gait & Station: normal Assets  Assets: Manufacturing systems engineer; Desire for Improvement; Resilience    Physical Exam: Physical Exam Vitals and nursing note reviewed.  HENT:     Head: Normocephalic.     Nose: Nose normal.     Mouth/Throat:     Mouth: Mucous membranes are moist.  Eyes:     Pupils: Pupils are equal, round, and reactive to light.  Cardiovascular:     Rate and Rhythm: Normal rate.     Pulses: Normal pulses.  Pulmonary:     Effort: Pulmonary effort is normal.  Abdominal:     General: Bowel sounds are normal.  Skin:    General: Skin is warm.  Neurological:     General: No focal deficit present.     Mental Status: She is alert.    Review of Systems  Constitutional: Negative.   HENT: Negative.    Eyes: Negative.   Cardiovascular: Negative.   Gastrointestinal: Negative.   Skin: Negative.   Neurological: Negative.    Blood pressure (!) 141/78, pulse 97, temperature 97.6 F (36.4 C), resp. rate 20, last menstrual period 11/07/2023, SpO2 98%. There is no height or weight on file to calculate BMI.  Diagnosis: Principal Problem:   Bipolar 2 disorder, major depressive episode (HCC)   PLAN: Safety and Monitoring:  -- Involuntary admission to inpatient psychiatric unit for safety, stabilization and treatment  -- Daily contact with patient to assess and evaluate symptoms and progress in treatment  -- Patient's case to be discussed in multi-disciplinary team meeting  -- Observation Level : q15 minute checks  -- Vital signs:  q12 hours  -- Precautions: suicide, elopement, and assault -- Encouraged patient to participate in unit milieu and in scheduled group therapies  2. Psychiatric Diagnoses and Treatment:    Seroquel 200mg  at bedtime Lamictal 200mg  daily Zoloft 50 mg daily Trazodone 50 mg at bedtime Requip 2mg  daily     3. Medical Issues Being  Addressed:   Consulted orthopedics for soaked cast  4. Discharge Planning:   -- Social work and case management to assist with discharge planning and identification of hospital follow-up needs prior to discharge  -- Estimated LOS: 3-4 days  Verner Chol, MD 11/18/2023, 8:33 PM

## 2023-11-18 NOTE — Group Note (Signed)
 Recreation Therapy Group Note   Group Topic:General Recreation  Group Date: 11/18/2023 Start Time: 1000 End Time: 1100 Facilitators: Rosina Lowenstein, LRT, CTRS Location: Courtyard  Group Description: [employer]. LRT and patients played games of basketball, drew with chalk, and played corn hole while outside in the courtyard while getting fresh air and sunlight. Music was being played in the background. LRT and peers conversed about different games they have played before, what they do in their free time and anything else that is on their minds. LRT encouraged pts to drink water after being outside, sweating and getting their heart rate up.  Goal Area(s) Addressed: Patient will build on frustration tolerance skills. Patients will partake in a competitive play game with peers. Patients will gain knowledge of new leisure interest/hobby.    Affect/Mood: N/A   Participation Level: Did not attend    Clinical Observations/Individualized Feedback: Patient did not attend group.   Plan: Continue to engage patient in RT group sessions 2-3x/week.   Rosina Lowenstein, LRT, CTRS 11/18/2023 1:25 PM

## 2023-11-18 NOTE — [facility] Counselor (Signed)
 CSW spoke with PHP program.  Patient was declined from services from [facility] program.   CSW to follow up with the patient on SAIOP and/or ACTT services.   Penni Homans, MSW, LCSW 11/18/2023 4:12 PM

## 2023-11-18 NOTE — Progress Notes (Signed)
 Patient presents irritable this morning and upset regarding an argument with MHT in the dayroom this morning. Actively listened to the patients concerns/complaints and then patient requested anxiety medication. Patient given hydroxyzine po prn which provided relief for patient. Patient appearing much calmer at this moment and observed socializing appropriately. Patient is med compliant and denies SI,HI, and A/V/H with no plan or intent stating "I am ready to go!" Patient reports adequate appetite and remains cooperative at this time.

## 2023-11-18 NOTE — Plan of Care (Signed)
  Problem: Education: Goal: Mental status will improve Outcome: Progressing   Problem: Activity: Goal: Interest or engagement in activities will improve Outcome: Progressing Goal: Sleeping patterns will improve Outcome: Progressing   Problem: Health Behavior/Discharge Planning: Goal: Compliance with treatment plan for underlying cause of condition will improve Outcome: Progressing   Problem: Safety: Goal: Periods of time without injury will increase Outcome: Progressing
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
## 2023-11-18 NOTE — BHH Suicide Risk Assessment (Signed)
 BHH INPATIENT:  Family/Significant Other Suicide Prevention Education  Suicide Prevention Education:  Education Completed; [name], husband, [phone] ,  (name of family member/significant other) has been identified by the patient as the family member/significant other with whom the patient will be residing, and identified as the person(s) who will aid the patient in the event of a mental health crisis (suicidal ideations/suicide attempt).  With written consent from the patient, the family member/significant other has been provided the following suicide prevention education, prior to the and/or following the discharge of the patient.  The suicide prevention education provided includes the following: Suicide risk factors Suicide prevention and interventions National Suicide Hotline telephone number [facility] assessment telephone number [facility] Emergency Assistance 911 [facility] and/or Residential Mobile Crisis Unit telephone number  Request made of family/significant other to: Remove weapons (e.g., guns, rifles, knives), all items previously/currently identified as safety concern.   Remove drugs/medications (over-the-counter, prescriptions, illicit drugs), all items previously/currently identified as a safety concern.  The family member/significant other verbalizes understanding of the suicide prevention education information provided.  The family member/significant other agrees to remove the items of safety concern listed above.  [name] 11/18/2023, 10:30 AM
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
## 2023-11-19 NOTE — Group Note (Signed)
 Date:  11/19/2023 Time:  10:11 PM  Group Topic/Focus:  Wrap-Up Group:   The focus of this group is to help patients review their daily goal of treatment and discuss progress on daily workbooks.    Participation Level:  Active  Participation Quality:  Appropriate  Affect:  Appropriate  Cognitive:  Appropriate  Insight: Improving  Engagement in Group:  Engaged  Modes of Intervention:  Discussion    Lenore Cordia 11/19/2023, 10:11 PM

## 2023-11-19 NOTE — Plan of Care (Signed)
  Problem: Education: Goal: Mental status will improve Outcome: Progressing   

## 2023-11-19 NOTE — Plan of Care (Signed)
  Problem: Education: Goal: Knowledge of [facility] Education information/materials will improve Outcome: Not Progressing Goal: Emotional status will improve Outcome: Not Progressing Goal: Mental status will improve Outcome: Not Progressing Goal: Verbalization of understanding the information provided will improve Outcome: Not Progressing   Problem: Activity: Goal: Interest or engagement in activities will improve Outcome: Not Progressing Goal: Sleeping patterns will improve Outcome: Not Progressing   Problem: Coping: Goal: Ability to verbalize frustrations and anger appropriately will improve Outcome: Not Progressing Goal: Ability to demonstrate self-control will improve Outcome: Not Progressing   Problem: Health Behavior/Discharge Planning: Goal: Identification of resources available to assist in meeting health care needs will improve Outcome: Not Progressing Goal: Compliance with treatment plan for underlying cause of condition will improve Outcome: Not Progressing   Problem: Physical Regulation: Goal: Ability to maintain clinical measurements within normal limits will improve Outcome: Not Progressing   Problem: Safety: Goal: Periods of time without injury will increase Outcome: Not Progressing   Problem: Safety: Goal: Ability to disclose and discuss suicidal ideas will improve Outcome: Not Progressing Goal: Ability to identify and utilize support systems that promote safety will improve Outcome: Not Progressing   Problem: Education: Goal: Ability to make informed decisions regarding treatment will improve Outcome: Not Progressing   Problem: Coping: Goal: Coping ability will improve Outcome: Not Progressing   Problem: Health Behavior/Discharge Planning: Goal: Identification of resources available to assist in meeting health care needs will improve Outcome: Not Progressing   Problem: Medication: Goal: Compliance with prescribed medication regimen will  improve Outcome: Not Progressing   Problem: Self-Concept: Goal: Ability to disclose and discuss suicidal ideas will improve Outcome: Not Progressing Goal: Will verbalize positive feelings about self Outcome: Not Progressing Note:
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
## 2023-11-19 NOTE — Group Note (Signed)
 Recreation Therapy Group Note   Group Topic:Leisure Education  Group Date: 11/19/2023 Start Time: 1000 End Time: 1100 Facilitators: Rosina Lowenstein, LRT, CTRS Location:  Craft Room  Group Description: Leisure. Patients were given the option to choose from singing karaoke, coloring mandalas, using oil pastels, journaling, or playing with play-doh. LRT and pts discussed the meaning of leisure, the importance of participating in leisure during their free time/when they're outside of the hospital, as well as how our leisure interests can also serve as coping skills.  Goal Area(s) Addressed:  Patient will identify a current leisure interest.  Patient will learn the definition of "leisure". Patient will practice making a positive decision. Patient will have the opportunity to try a new leisure activity. Patient will communicate with peers and LRT.    Affect/Mood: Appropriate   Participation Level: Minimal    Clinical Observations/Individualized Feedback: [name] came to group with 10 minutes remaining. Pt sang a song of karaoke while present.   Plan: Continue to engage patient in RT group sessions 2-3x/week.   Rosina Lowenstein, LRT, CTRS 11/19/2023 1:26 PM
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
## 2023-11-19 NOTE — Group Note (Signed)
 Date:  11/19/2023 Time:  10:16 AM  Group Topic/Focus:  Goals Group:   The focus of this group is to help patients establish daily goals to achieve during treatment and discuss how the patient can incorporate goal setting into their daily lives to aide in recovery.    Participation Level:  Minimal  Participation Quality:  Appropriate  Affect:  Appropriate  Cognitive:  Appropriate  Insight: Appropriate  Engagement in Group:  Limited  Modes of Intervention:  Discussion and Education  Additional Comments:    Wilford Corner 11/19/2023, 10:16 AM

## 2023-11-19 NOTE — Progress Notes (Signed)
   11/19/23 1300  Psych Admission Type (Psych Patients Only)  Admission Status Involuntary  Psychosocial Assessment  Patient Complaints Crying spells;Anxiety;Depression (goal " stay clam" depression 2/10 anxiety 7/10)  Eye Contact Fair  Facial Expression Anxious;Sad  Affect Labile  Speech Logical/coherent  Interaction Assertive;Attention-seeking;Needy  Motor Activity Slow  Appearance/Hygiene In scrubs  Behavior Characteristics Cooperative;Anxious;Restless  Mood Labile (pt has periods of crying)  Thought Process  Coherency WDL  Content WDL  Delusions None reported or observed  Perception WDL  Hallucination None reported or observed  Judgment Poor  Confusion None  Danger to Self  Current suicidal ideation?  (denies)  Agreement Not to Harm Self No  Description of Agreement verbal  Danger to Others  Danger to Others None reported or observed

## 2023-11-20 MED ORDER — TRAMADOL HCL 50 MG PO TABS
50.0000 mg | ORAL_TABLET | Freq: Once | ORAL | Status: AC | PRN
  Administered 2023-11-20: 50 mg via ORAL
  Filled 2023-11-20: qty 1

## 2023-11-20 NOTE — Group Note (Signed)
 LCSW Group Therapy Note   Group Date: 11/20/2023 Start Time: 1400 End Time: 1500   Type of Therapy and Topic:  Group Therapy: AA/NA Group  Participation Level:  Active  Description of Group: AA/NA Group  Summary of Patient Progress:    Patient attended group.     [name], LCSW 11/20/2023  3:16 PM

## 2023-11-20 NOTE — Progress Notes (Signed)
   11/20/23 0831  Psych Admission Type (Psych Patients Only)  Admission Status Involuntary  Psychosocial Assessment  Patient Complaints Anxiety;Depression  Eye Contact Fair  Facial Expression Anxious  Affect Anxious  Speech Logical/coherent  Interaction Assertive;Attention-seeking  Motor Activity Slow  Appearance/Hygiene In scrubs  Behavior Characteristics Restless  Mood Anxious  Aggressive Behavior  Effect No apparent injury  Thought Process  Coherency WDL  Content WDL  Delusions None reported or observed  Perception WDL  Hallucination None reported or observed  Judgment WDL  Confusion None  Danger to Self  Current suicidal ideation? Denies  Description of Suicide Plan verbal  Agreement Not to Harm Self Yes  Danger to Others  Danger to Others None reported or observed   D: Pt alert and oriented. Pt reports experiencing anxiety 8/10, and denies any depression at time of assessment (0830). Pt denies reports experiencing generalized/ R. leg pain 7-8  out of 10. Pt denies experiencing any SI/HI, or AVH at this time.  Pt remains wheelchair bound w/o any falls event.   A: Scheduled medications administered per MD orders, Tyenol 650mg  po prn offered at 1124 for c/o 7/10 generalized pain, Ibuprofen 800 mg po prn offered at 1356 for c/o R. Leg pain 8/10, Hydroxyzine 50 mg po prn offered at 0831 and 1356 for c/o anxiety rated 8/10 and 8/10 respectively, benadryl 50 mg po prn at 1634 for c/o mild agitation. Support and encouragement provided. Frequent verbal contact made. Routine safety checks conducted q15 minutes.   R: No adverse drug reactions noted. Patient reported effective and/or relief from offered prns. Pt verbally contracts for safety. Pt complaint with medications. Pt interacts appropriately with others on the unit. Pt remains safe at this time. Will continue plan of care.

## 2023-11-20 NOTE — Plan of Care (Signed)
   Problem: Education: Goal: Knowledge of [facility] Education information/materials will improve Outcome: Progressing Goal: Emotional status will improve Outcome: Progressing Goal: Mental status will improve Outcome: Progressing

## 2023-11-20 NOTE — Progress Notes (Signed)
   11/19/23 2000  Psych Admission Type (Psych Patients Only)  Admission Status Involuntary  Psychosocial Assessment  Patient Complaints Anxiety;Depression  Eye Contact Fair  Facial Expression Anxious  Affect Labile  Speech Logical/coherent  Interaction Assertive;Attention-seeking  Motor Activity Slow  Appearance/Hygiene In scrubs  Behavior Characteristics Restless  Mood Labile  Aggressive Behavior  Effect No apparent injury  Thought Process  Coherency WDL  Content WDL  Delusions None reported or observed  Perception WDL  Hallucination None reported or observed  Judgment WDL  Confusion None  Danger to Self  Current suicidal ideation? Denies  Agreement Not to Harm Self Yes  Danger to Others  Danger to Others None reported or observed

## 2023-11-20 NOTE — Group Note (Deleted)
 Date:  11/20/2023 Time:  9:53 PM  Group Topic/Focus:  Wrap-Up Group:   The focus of this group is to help patients review their daily goal of treatment and discuss progress on daily workbooks.     Participation Level:  {BHH PARTICIPATION WUJWJ:19147}  Participation Quality:  {BHH PARTICIPATION QUALITY:22265}  Affect:  {BHH AFFECT:22266}  Cognitive:  {BHH COGNITIVE:22267}  Insight: {BHH Insight2:20797}  Engagement in Group:  {BHH ENGAGEMENT IN WGNFA:21308}  Modes of Intervention:  {BHH MODES OF INTERVENTION:22269}  Additional Comments:  ***  Maglione,Angell Honse E 11/20/2023, 9:53 PM

## 2023-11-20 NOTE — Progress Notes (Signed)
 [facility] MD Progress Note  11/20/2023 11:51 AM [name]  MRN:  [id number] [name] is a 43 year old female with a history of depression, borderline personality disorder, PTSD, bipolar II disorder, alcohol use disorder, stimulant use disorder, chronic pain, discharged from psychiatric hospitalization 3/31, who presents to the ED with suicidal ideation. Chart reviewed, ethanol 38, UDS negative. Psychiatry consulted for disposition recommendations. On evaluation, patient noted to be tearful, dysphoric, linear, not appearing internally preoccupied, not responding to internal stimuli, alert and oriented x 4. Patient endorses active suicidal ideation with intent and plan to overdose on medication. Patient is admitted to [facility] unit with Q15 min safety monitoring. Multidisciplinary team approach is offered. Medication management; group/milieu therapy is offered.   Subjective:  Chart reviewed, case discussed in multidisciplinary meeting, patient seen during rounds.  Patient continues to be discharged focused.  States that she was admitted to the inpatient unit because she was struggling with depression, experiencing suicidal ideations for a day, took a handful of Tylenol 500 mg pills, had recently relapsed on alcohol, conflicts with her husband at home.  At this time she is denying any SI/HI and AVH.  Reports good sleep and appetite.  Does endorse some anxiety which she rates as a 5 out of 10.  Denies any medication side effects.  Denies history of withdrawal symptoms, DTs, seizures.  That she lives at home with her husband and 23 year old son who is autistic.  Per nursing staff she does have upcoming appointment scheduled with [facility] on April 11, denies all psychiatric symptoms, continues to request pain medications.  She is med compliant, interacts appropriately with others.    Sleep: Fair  Appetite:  Fair  Past Psychiatric History: see h&P Family History:  Family History  Problem Relation Age  of Onset   Cancer Father        prostate   Cancer Sister        bladder   Lung disease Mother    Cancer Maternal Uncle        Lung   Cancer Paternal Aunt        Breast   Cancer Maternal Grandfather    Cancer Paternal Grandfather    Cancer Paternal Aunt        Breast   Social History:  Social History   Substance and Sexual Activity  Alcohol Use Yes   Comment: 1 beer 11/15/2023     Social History   Substance and Sexual Activity  Drug Use Yes   Types: Marijuana   Comment: pt denies (there is documented hx cannibus use)    Social History   Socioeconomic History   Marital status: Married    Spouse name: Not on file   Number of children: Not on file   Years of education: Not on file   Highest education level: Not on file  Occupational History   Not on file  Tobacco Use   Smoking status: Every Day    Current packs/day: 0.50    Average packs/day: 0.5 packs/day for 15.0 years (7.5 ttl pk-yrs)    Types: Cigarettes   Smokeless tobacco: Never  Vaping Use   Vaping status: Never Used  Substance and Sexual Activity   Alcohol use: Yes    Comment: 1 beer 11/15/2023   Drug use: Yes    Types: Marijuana    Comment: pt denies (there is documented hx cannibus use)   Sexual activity: Yes    Birth control/protection: Surgical  Other Topics Concern  Not on file  Social History Narrative   Pt also has hx of an aunt and uncle with lung cancer.          Social Drivers of Corporate investment banker Strain: Not on file  Food Insecurity: No Food Insecurity (11/16/2023)   Hunger Vital Sign    Worried About Running Out of Food in the Last Year: Never true    Ran Out of Food in the Last Year: Never true  Recent Concern: Food Insecurity - Food Insecurity Present (11/10/2023)   Hunger Vital Sign    Worried About Running Out of Food in the Last Year: Sometimes true    Ran Out of Food in the Last Year: Sometimes true  Transportation Needs: No Transportation Needs (11/16/2023)   PRAPARE  - Administrator, Civil Service (Medical): No    Lack of Transportation (Non-Medical): No  Physical Activity: Not on file  Stress: Not on file  Social Connections: Unknown (12/28/2021)   Received from [facility]   Social Network    Social Network: Not on file   Past Medical History:  Past Medical History:  Diagnosis Date   Alcohol abuse    Borderline personality disorder (HCC) 11/15/2023   H/O psychiatric hospitalization 11/15/2023   History of cervical dysplasia    CIN I  in  2004   Inflamed external hemorrhoid    Inflamed internal hemorrhoid    Major depression    PTSD (post-traumatic stress disorder)    Rectal pain    Scoliosis 11/12/2012   Suicide attempt (HCC) 11/15/2023    Past Surgical History:  Procedure Laterality Date   APPENDECTOMY  08/17/1992   CERVICAL BIOPSY  W/ LOOP ELECTRODE EXCISION  06/11/2003   EVALUATION UNDER ANESTHESIA WITH HEMORRHOIDECTOMY N/A 01/22/2014   Procedure: EXAM UNDER ANESTHESIA WITH HEMORRHOIDECTOMY;  Surgeon: Romie Levee, MD;  Location: [facility];  Service: General;  Laterality: N/A;   FOOT SURGERY Right    KNEE SURGERY Bilateral    PELVIC FRACTURE SURGERY     TUBAL LIGATION  05/08/2005   W/  FILSHIE CLIPS    Current Medications: Current Facility-Administered Medications  Medication Dose Route Frequency Provider Last Rate Last Admin   acetaminophen (TYLENOL) tablet 650 mg  650 mg Oral Q6H PRN Verner Chol, MD   650 mg at 11/20/23 1124   alum & mag hydroxide-simeth (MAALOX/MYLANTA) 200-200-20 MG/5ML suspension 30 mL  30 mL Oral Q4H PRN Dahlia Byes C, NP       aspirin EC tablet 81 mg  81 mg Oral Daily Onuoha, Josephine C, NP   81 mg at 11/20/23 0831   busPIRone (BUSPAR) tablet 10 mg  10 mg Oral TID Dahlia Byes C, NP   10 mg at 11/20/23 1123   clopidogrel (PLAVIX) tablet 75 mg  75 mg Oral Daily Onuoha, Josephine C, NP   75 mg at 11/20/23 6962   haloperidol (HALDOL) tablet 5 mg   5 mg Oral TID PRN Dahlia Byes C, NP   5 mg at 11/18/23 2110   And   diphenhydrAMINE (BENADRYL) capsule 50 mg  50 mg Oral TID PRN Dahlia Byes C, NP   50 mg at 11/18/23 2109   haloperidol lactate (HALDOL) injection 5 mg  5 mg Intramuscular TID PRN Dahlia Byes C, NP       And   diphenhydrAMINE (BENADRYL) injection 50 mg  50 mg Intramuscular TID PRN Earney Navy, NP  And   LORazepam (ATIVAN) injection 2 mg  2 mg Intramuscular TID PRN Dahlia Byes C, NP       haloperidol lactate (HALDOL) injection 10 mg  10 mg Intramuscular TID PRN Dahlia Byes C, NP       And   diphenhydrAMINE (BENADRYL) injection 50 mg  50 mg Intramuscular TID PRN Earney Navy, NP       And   LORazepam (ATIVAN) injection 2 mg  2 mg Intramuscular TID PRN Earney Navy, NP       folic acid (FOLVITE) tablet 1 mg  1 mg Oral Daily Dahlia Byes C, NP   1 mg at 11/20/23 0831   gabapentin (NEURONTIN) capsule 600 mg  600 mg Oral TID Dahlia Byes C, NP   600 mg at 11/20/23 1124   hydrOXYzine (ATARAX) tablet 50 mg  50 mg Oral TID PRN Verner Chol, MD   50 mg at 11/20/23 0831   ibuprofen (ADVIL) tablet 800 mg  800 mg Oral Q6H PRN Verner Chol, MD   800 mg at 11/20/23 0505   lamoTRIgine (LAMICTAL) tablet 200 mg  200 mg Oral Daily Dahlia Byes C, NP   200 mg at 11/20/23 0830   lip balm (CARMEX) ointment 1 Application  1 Application Topical PRN Dahlia Byes C, NP       magnesium hydroxide (MILK OF MAGNESIA) suspension 30 mL  30 mL Oral Daily PRN Dahlia Byes C, NP   30 mL at 11/19/23 0855   naloxone (NARCAN) nasal spray 4 mg/0.1 mL  1 spray Nasal PRN Dahlia Byes C, NP       naproxen (NAPROSYN) tablet 250 mg  250 mg Oral BID WC Verner Chol, MD   250 mg at 11/20/23 0831   nicotine (NICODERM CQ - dosed in mg/24 hours) patch 21 mg  21 mg Transdermal Daily Verner Chol, MD   21 mg at 11/20/23 1308   nicotine polacrilex (NICORETTE) gum 2 mg  2 mg Oral PRN  Verner Chol, MD   2 mg at 11/17/23 1031   polyvinyl alcohol (LIQUIFILM TEARS) 1.4 % ophthalmic solution 1 drop  1 drop Both Eyes QID PRN Dahlia Byes C, NP       QUEtiapine (SEROQUEL) tablet 200 mg  200 mg Oral QHS Onuoha, Josephine C, NP   200 mg at 11/19/23 2116   rOPINIRole (REQUIP) tablet 2 mg  2 mg Oral Q2000 Onuoha, Josephine C, NP   2 mg at 11/19/23 2116   sertraline (ZOLOFT) tablet 50 mg  50 mg Oral Daily Onuoha, Josephine C, NP   50 mg at 11/20/23 0830   traZODone (DESYREL) tablet 50 mg  50 mg Oral QHS Dahlia Byes C, NP   50 mg at 11/19/23 2116    Lab Results: No results found for this or any previous visit (from the past 48 hours).  Blood Alcohol level:  Lab Results  Component Value Date   ETH 38 (H) 11/15/2023   ETH 106 (H) 11/08/2023    Metabolic Disorder Labs: Lab Results  Component Value Date   HGBA1C 5.2 11/19/2022   MPG 103 11/19/2022   MPG 103 01/30/2020   No results found for: "PROLACTIN" Lab Results  Component Value Date   CHOL 186 11/19/2022   TRIG 176 (H) 11/19/2022   HDL 45 11/19/2022   CHOLHDL 4.1 11/19/2022   VLDL 35 11/19/2022   LDLCALC 106 (H) 11/19/2022   LDLCALC 82 01/30/2020    Physical Findings: AIMS:  , ,  ,  ,  CIWA:    COWS:      Psychiatric Specialty Exam:  Presentation  General Appearance:  Appropriate for Environment; Casual  Eye Contact: Fair  Speech: Clear and Coherent  Speech Volume: Normal    Mood and Affect  Mood: Euthymic  Affect: Appropriate   Thought Process  Thought Processes: Coherent  Descriptions of Associations:Intact  Orientation:Full (Time, Place and Person)  Thought Content:Logical  Hallucinations:Hallucinations: None  Ideas of Reference:None  Suicidal Thoughts:Suicidal Thoughts: No  Homicidal Thoughts:Homicidal Thoughts: No   Sensorium  Memory: Immediate Fair; Recent Fair; Remote Fair  Judgment: Impaired  Insight: Shallow   Executive Functions   Concentration: Fair  Attention Span: Fair  Recall: Fair  Fund of Knowledge: Fair  Language: Fair   Psychomotor Activity  Psychomotor Activity: Psychomotor Activity: Normal  Musculoskeletal: Strength & Muscle Tone: within normal limits Gait & Station: normal Assets  Assets: Manufacturing systems engineer; Desire for Improvement; Resilience    Physical Exam: Physical Exam Vitals and nursing note reviewed.  HENT:     Head: Normocephalic.     Nose: Nose normal.     Mouth/Throat:     Mouth: Mucous membranes are moist.  Eyes:     Pupils: Pupils are equal, round, and reactive to light.  Cardiovascular:     Rate and Rhythm: Normal rate.     Pulses: Normal pulses.  Pulmonary:     Effort: Pulmonary effort is normal.  Abdominal:     General: Bowel sounds are normal.  Skin:    General: Skin is warm.  Neurological:     General: No focal deficit present.     Mental Status: She is alert.    Review of Systems  Constitutional: Negative.   HENT: Negative.    Eyes: Negative.   Cardiovascular: Negative.   Gastrointestinal: Negative.   Skin: Negative.   Neurological: Negative.    Blood pressure 118/71, pulse 87, temperature (!) 97.4 F (36.3 C), resp. rate 16, last menstrual period 11/07/2023, SpO2 98%. There is no height or weight on file to calculate BMI.  Diagnosis: Principal Problem:   Bipolar 2 disorder, major depressive episode (HCC)   PLAN: Safety and Monitoring:  -- Involuntary admission to inpatient psychiatric unit for safety, stabilization and treatment  -- Daily contact with patient to assess and evaluate symptoms and progress in treatment  -- Patient's case to be discussed in multi-disciplinary team meeting  -- Observation Level : q15 minute checks  -- Vital signs:  q12 hours  -- Precautions: suicide, elopement, and assault -- Encouraged patient to participate in unit milieu and in scheduled group therapies   2. Psychiatric Diagnoses and Treatment:    Buspar 10 TID Seroquel 200mg  at bedtime Lamictal 200mg  daily Zoloft 50 mg daily Gabapentin 600 mg TID Trazodone 50 mg at bedtime Requip 2mg  HS     3. Medical Issues Being Addressed:   Pt continued on home meds Plavix 75 mg daily, ASA EC 81 mg daily  Consulted orthopedics for soaked cast  4. Discharge Planning: Husband is requesting for discharge on Monday so that he can have the weekend to rearrange things and prepare his son for patient's arrival.  -- Social work and case management to assist with discharge planning and identification of hospital follow-up needs prior to discharge  -- Estimated LOS: 2-3 days  [employer], PA-C 11/20/2023, 11:51 AM

## 2023-11-20 NOTE — Group Note (Signed)
 Date:  11/20/2023 Time:  10:03 PM  Group Topic/Focus:  Wrap-Up Group:   The focus of this group is to help patients review their daily goal of treatment and discuss progress on daily workbooks.    Participation Level:  Did Not Attend    Maglione,Suren Payne E 11/20/2023, 10:03 PM

## 2023-11-21 NOTE — Group Note (Signed)
 Date:  11/21/2023 Time:  5:41 PM  Group Topic/Focus:  Activity Group: The focus of the group is to promote activity for the patients and encourage them to go outside in the courtyard and get some fresh air and some exercise.    Participation Level:  Active  Participation Quality:  Appropriate  Affect:  Appropriate  Cognitive:  Appropriate  Insight: Appropriate  Engagement in Group:  Engaged  Modes of Intervention:  Activity  Additional Comments:    [name] 11/21/2023, 5:41 PM

## 2023-11-21 NOTE — Group Note (Signed)
 Date:  11/21/2023 Time:  11:03 PM  Group Topic/Focus:  Wrap-Up Group:   The focus of this group is to help patients review their daily goal of treatment and discuss progress on daily workbooks.    Participation Level:  Active  Participation Quality:  Appropriate and Attentive  Affect:  Appropriate, Excited, and Flat  Cognitive:  Alert and Appropriate  Insight: Appropriate  Engagement in Group:  Engaged and Supportive  Modes of Intervention:  Education, Orientation, and Support  Additional Comments:     Maglione,Marilyn Wing E 11/21/2023, 11:03 PM

## 2023-11-21 NOTE — Plan of Care (Signed)
 Patient was cooperative with treatment on the shift, she was compliant with medication, she still endorses anxiety and chronic pain, medication as needed per [facility], she denies SI, HI & AVH.

## 2023-11-21 NOTE — Plan of Care (Signed)
   Problem: Education: Goal: Knowledge of [facility] Education information/materials will improve Outcome: Progressing Goal: Emotional status will improve Outcome: Progressing Goal: Mental status will improve Outcome: Progressing

## 2023-11-21 NOTE — Progress Notes (Signed)
   11/21/23 0525  Psych Admission Type (Psych Patients Only)  Admission Status Involuntary  Psychosocial Assessment  Patient Complaints Anxiety  Eye Contact Fair  Facial Expression Anxious  Affect Labile  Speech Logical/coherent  Interaction Assertive;Attention-seeking  Motor Activity Slow  Appearance/Hygiene In scrubs  Behavior Characteristics Restless  Mood Anxious  Aggressive Behavior  Effect No apparent injury  Thought Process  Coherency WDL  Content WDL  Delusions None reported or observed  Perception WDL  Hallucination None reported or observed  Judgment WDL  Confusion None  Danger to Self  Current suicidal ideation? Denies  Agreement Not to Harm Self Yes  Danger to Others  Danger to Others None reported or observed
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
## 2023-11-22 MED ORDER — HYDROXYZINE HCL 50 MG PO TABS: 50.0000 mg | ORAL_TABLET | Freq: Three times a day (TID) | ORAL | 0 refills | Status: AC | PRN

## 2023-11-22 MED ORDER — NICOTINE 21 MG/24HR TD PT24: 21.0000 mg | MEDICATED_PATCH | Freq: Every day | TRANSDERMAL | 0 refills | Status: AC

## 2023-11-22 MED ORDER — GABAPENTIN 300 MG PO CAPS: 600.0000 mg | ORAL_CAPSULE | Freq: Three times a day (TID) | ORAL | 0 refills | Status: AC

## 2023-11-22 MED ORDER — CLOPIDOGREL BISULFATE 75 MG PO TABS
75.0000 mg | ORAL_TABLET | Freq: Every day | ORAL | 0 refills | Status: AC
Start: 2023-11-23 — End: ?

## 2023-11-22 MED ORDER — FOLIC ACID 1 MG PO TABS: 1.0000 mg | ORAL_TABLET | Freq: Every day | ORAL | Status: AC

## 2023-11-22 MED ORDER — ASPIRIN 81 MG PO TBEC
81.0000 mg | DELAYED_RELEASE_TABLET | Freq: Every day | ORAL | 12 refills | Status: AC
Start: 2023-11-23 — End: ?

## 2023-11-22 MED ORDER — ROPINIROLE HCL 2 MG PO TABS: 2.0000 mg | ORAL_TABLET | Freq: Every day | ORAL | 0 refills | Status: DC

## 2023-11-22 MED ORDER — QUETIAPINE FUMARATE 200 MG PO TABS: 200.0000 mg | ORAL_TABLET | Freq: Every day | ORAL | 0 refills | Status: AC

## 2023-11-22 MED ORDER — NAPROXEN 250 MG PO TABS
250.0000 mg | ORAL_TABLET | Freq: Two times a day (BID) | ORAL | 0 refills | Status: AC
Start: 2023-11-22 — End: ?

## 2023-11-22 MED ORDER — TRAZODONE HCL 50 MG PO TABS: 50.0000 mg | ORAL_TABLET | Freq: Every day | ORAL | 0 refills | Status: AC

## 2023-11-22 MED ORDER — POLYVINYL ALCOHOL 1.4 % OP SOLN: 1.0000 [drp] | Freq: Four times a day (QID) | OPHTHALMIC | 0 refills | Status: AC | PRN

## 2023-11-22 MED ORDER — SERTRALINE HCL 50 MG PO TABS: 50.0000 mg | ORAL_TABLET | Freq: Every day | ORAL | 0 refills | Status: AC

## 2023-11-22 MED ORDER — BUSPIRONE HCL 10 MG PO TABS: 10.0000 mg | ORAL_TABLET | Freq: Three times a day (TID) | ORAL | 0 refills | Status: AC

## 2023-11-22 MED ORDER — LAMOTRIGINE 200 MG PO TABS: 200.0000 mg | ORAL_TABLET | Freq: Every day | ORAL | 0 refills | Status: AC

## 2023-11-22 NOTE — Progress Notes (Signed)
   11/22/23 0900  Psych Admission Type (Psych Patients Only)  Admission Status Involuntary  Psychosocial Assessment  Patient Complaints Anxiety  Eye Contact Fair  Facial Expression Animated  Affect Anxious  Speech Logical/coherent  Interaction Assertive  Motor Activity Slow  Appearance/Hygiene Unremarkable  Behavior Characteristics Cooperative;Appropriate to situation  Mood Anxious  Aggressive Behavior  Effect No apparent injury  Thought Process  Coherency WDL  Content WDL  Delusions None reported or observed  Perception WDL  Hallucination None reported or observed  Judgment WDL  Confusion None  Danger to Self  Current suicidal ideation? Denies  Agreement Not to Harm Self Yes  Description of Agreement verbal

## 2023-11-22 NOTE — BHH Suicide Risk Assessment (Signed)
 [facility] Discharge Suicide Risk Assessment   Principal Problem: Bipolar 2 disorder, major depressive episode (HCC) Discharge Diagnoses: Principal Problem:   Bipolar 2 disorder, major depressive episode (HCC)   Total Time spent with patient: 30 minutes  Musculoskeletal: Strength & Muscle Tone: wheelchair based Gait & Station: wheelchair based Patient leans: N/A  Psychiatric Specialty Exam  Presentation  General Appearance:  Appropriate for Environment; Casual  Eye Contact: Fair  Speech: Clear and Coherent  Speech Volume: Normal  Handedness: Right   Mood and Affect  Mood: Euthymic  Duration of Depression Symptoms: No data recorded Affect: Appropriate   Thought Process  Thought Processes: Coherent  Descriptions of Associations:Intact  Orientation:Full (Time, Place and Person)  Thought Content:Logical  History of Schizophrenia/Schizoaffective disorder:No data recorded Duration of Psychotic Symptoms:No data recorded Hallucinations:Hallucinations: None  Ideas of Reference:None  Suicidal Thoughts:Suicidal Thoughts: No  Homicidal Thoughts:Homicidal Thoughts: No   Sensorium  Memory: Immediate Fair; Recent Fair; Remote Fair  Judgment: Fair  Insight: Fair   Art therapist  Concentration: Fair  Attention Span: Fair  Recall: [employer] of Knowledge: Fair  Language: Fair   Psychomotor Activity  Psychomotor Activity: Psychomotor Activity: Normal   Assets  Assets: Communication Skills; Desire for Improvement; Physical Health   Sleep  Sleep: Sleep: Fair   Physical Exam: Physical Exam ROS Blood pressure 119/85, pulse 91, temperature 97.9 F (36.6 C), resp. rate 20, last menstrual period 11/07/2023, SpO2 97%. There is no height or weight on file to calculate BMI.  Mental Status Per Nursing Assessment::   On Admission:  NA  Demographic Factors:  Caucasian  Loss Factors: Decrease in vocational status  Historical  Factors: Impulsivity  Risk Reduction Factors:   Sense of responsibility to family, Religious beliefs about death, Living with another person, especially a relative, Positive social support, Positive therapeutic relationship, and Positive coping skills or problem solving skills  Continued Clinical Symptoms:  Bipolar Disorder:   Bipolar II Previous Psychiatric Diagnoses and Treatments  Cognitive Features That Contribute To Risk:  None    Suicide Risk:  Minimal: No identifiable suicidal ideation.  Patients presenting with no risk factors but with morbid ruminations; may be classified as minimal risk based on the severity of the depressive symptoms   Follow-up Information     [facility] Go to.   Why: Virtual appointment is 10/26/23 at 3 PM. During this initial appointment, if eligible, you will be set up with ACT team and needed services. Contact information: [address]  [address] [place] [address] [phone]         [facility] Follow up.   Why: They have a chemical dependent intensive outpatient program you may be appropriate for.  Please call Monday at 8AM to see if determine if appropriate for service, [phone]. Contact information: [address] [place] [address]                Plan Of Care/Follow-up recommendations:  Activity:  As tolearted  Verner Chol, MD 11/22/2023, 10:39 AM

## 2023-11-22 NOTE — Group Note (Signed)
 Recreation Therapy Group Note   Group Topic:Coping Skills  Group Date: 11/22/2023 Start Time: 1010 End Time: 1110 Facilitators: Rosina Lowenstein, LRT, CTRS Location:  Craft Room  Group Description: Mind Map.  Patient was provided a blank template of a diagram with 32 blank boxes in a tiered system, branching from the center (similar to a bubble chart). LRT directed patients to label the middle of the diagram "Coping Skills". LRT and patients then came up with 8 different coping skills as examples. Pt were directed to record their coping skills in the 2nd tier boxes closest to the center.  Patients would then share their coping skills with the group as LRT wrote them out. LRT gave a handout of 99 different coping skills at the end of group.   Goal Area(s) Addressed: Patients will be able to define "coping skills". Patient will identify new coping skills.  Patient will increase communication.   Affect/Mood: Appropriate   Participation Level: Active and Engaged   Participation Quality: Independent   Behavior: Calm and Cooperative   Speech/Thought Process: Coherent   Insight: Good   Judgement: Good   Modes of Intervention: Education, Exploration, Guided Discussion, Worksheet, and Writing   Patient Response to Interventions:  Attentive, Engaged, Interested , and Receptive   Education Outcome:  Acknowledges education   Clinical Observations/Individualized Feedback: Milany was active in their participation of session activities and group discussion. Pt identified "walking away, showering, brushing my hair" as coping skills. Pt spontaneously contributed to group discussion. Pt interacted well with LRT and peers duration of session.    Plan: Continue to engage patient in RT group sessions 2-3x/week.   Rosina Lowenstein, LRT, CTRS 11/22/2023 11:39 AM

## 2023-11-22 NOTE — Progress Notes (Signed)
   11/21/23 2000  Psych Admission Type (Psych Patients Only)  Admission Status Involuntary  Psychosocial Assessment  Patient Complaints Anxiety  Eye Contact Fair  Facial Expression Anxious  Affect Anxious  Speech Logical/coherent  Interaction Attention-seeking;Assertive  Motor Activity Slow  Appearance/Hygiene In scrubs  Behavior Characteristics Anxious  Mood Anxious  Aggressive Behavior  Effect No apparent injury  Thought Process  Coherency WDL  Content WDL  Delusions None reported or observed  Perception WDL  Hallucination None reported or observed  Judgment WDL  Confusion None  Danger to Self  Current suicidal ideation? Denies  Agreement Not to Harm Self Yes  Danger to Others  Danger to Others None reported or observed   Patient tearful this shift about what she would do when she gets home. Reports that she does not have a good support system. States that husband drinks as well and is belittling  and the anxiety causes her to drink. Patient upset, requesting prn to help sleep. Continues to complain of foot pain. Denies SI, HI, AVH. Encouragement and support provided. Safety checks maintained. Medications given as prescribed. Pt receptive and remains safe on unit with q 15 min checks.

## 2023-11-22 NOTE — Progress Notes (Signed)
   11/22/23 0945  Spiritual Encounters  Type of Visit Follow up  Care provided to: Patient  Referral source Patient request  Reason for visit Routine spiritual support  OnCall Visit Yes  Spiritual Framework  Presenting Themes Impactful experiences and emotions;Other (comment) (Pt expressed her appreciation of the chaplain visit last week.)  Interventions  Spiritual Care Interventions Made Compassionate presence;Reflective listening;Meaning making;Encouragement  Intervention Outcomes  Outcomes Connection to spiritual care;Awareness of support;Reduced anxiety;Autonomy/agency

## 2023-11-22 NOTE — Plan of Care (Signed)
  Problem: Education: Goal: Emotional status will improve Outcome: Not Progressing Goal: Mental status will improve Outcome: Progressing Goal: Verbalization of understanding the information provided will improve Outcome: Progressing   Problem: Activity: Goal: Interest or engagement in activities will improve Outcome: Progressing   Problem: Coping: Goal: Ability to verbalize frustrations and anger appropriately will improve Outcome: Progressing

## 2023-11-22 NOTE — Plan of Care (Signed)
  Problem: Education: Goal: Knowledge of [facility] Education information/materials will improve Outcome: Progressing Goal: Emotional status will improve Outcome: Progressing Goal: Mental status will improve Outcome: Progressing Goal: Verbalization of understanding the information provided will improve Outcome: Progressing   Problem: Activity: Goal: Interest or engagement in activities will improve Outcome: Progressing Goal: Sleeping patterns will improve Outcome: Progressing   Problem: Coping: Goal: Ability to verbalize frustrations and anger appropriately will improve Outcome: Progressing Goal: Ability to demonstrate self-control will improve Outcome: Progressing   Problem: Health Behavior/Discharge Planning: Goal: Identification of resources available to assist in meeting health care needs will improve Outcome: Progressing Goal: Compliance with treatment plan for underlying cause of condition will improve Outcome: Progressing   Problem: Physical Regulation: Goal: Ability to maintain clinical measurements within normal limits will improve Outcome: Progressing   Problem: Safety: Goal: Periods of time without injury will increase Outcome: Progressing   Problem: Safety: Goal: Ability to disclose and discuss suicidal ideas will improve Outcome: Progressing Goal: Ability to identify and utilize support systems that promote safety will improve Outcome: Progressing   Problem: Education: Goal: Ability to make informed decisions regarding treatment will improve Outcome: Progressing   Problem: Coping: Goal: Coping ability will improve Outcome: Progressing   Problem: Health Behavior/Discharge Planning: Goal: Identification of resources available to assist in meeting health care needs will improve Outcome: Progressing   Problem: Medication: Goal: Compliance with prescribed medication regimen will improve Outcome: Progressing   Problem: Self-Concept: Goal:  Ability to disclose and discuss suicidal ideas will improve Outcome: Progressing Goal: Will verbalize positive feelings about self Outcome: Progressing Note: Patient is on track. Patient will maintain adherence

## 2023-11-22 NOTE — Progress Notes (Signed)
 Discharge Note:  Patient denies SI/HI/AVH at this time. Discharge instructions, AVS, prescriptions, and transition record gone over with patient. Patient agrees to comply with medication management, follow-up visit, and outpatient therapy. Patient belongings returned to patient. Patient questions and concerns addressed and answered. Patient  will discharge to home VIA safe transport

## 2023-11-22 NOTE — Progress Notes (Signed)
  [facility] Adult Case Management Discharge Plan :  Will you be returning to the same living situation after discharge:  Yes,  Patient to return home.  At discharge, do you have transportation home?: Yes,  CSW to arrange transportation services on patient's behalf.  Do you have the ability to pay for your medications: Yes, [employer] / [employer]   Release of information consent forms completed and in the chart;  Patient's signature needed at discharge.  Patient to Follow up at:  Follow-up Information     [facility]. Go to.   Why: Virtual appointment is 10/26/23 at 3 PM. During this initial appointment, if eligible, you will be set up with ACT team and needed services. Contact information: [address]  [address] [place] [address] [phone]         [facility] Follow up.   Why: They have a chemical dependent intensive outpatient program you may be appropriate for.  Please call Monday at 8AM to see if determine if appropriate for service, [phone]. Contact information: [address] [place] [address]                Next level of care provider has access to [facility] Link:no  Safety Planning and Suicide Prevention discussed: Yes, Education Completed; Doyle Askew, husband, [phone] ,  (name of family member/significant other) has been identified by the patient as the family member/significant other with whom the patient will be residing, and identified as the person(s) who will aid the patient in the event of a mental health crisis (suicidal ideations/suicide attempt).  With written consent from the patient, the family member/significant other has been provided the following suicide prevention education, prior to the and/or following the discharge of the patient.      Has patient been referred to the Quitline?: Patient refused referral for treatment  Patient has been referred for addiction treatment: Yes,  the patient will follow up with an outpatient provider for substance use disorder. Psychiatrist/APP: appointment made and Therapist: appointment made  Lowry Ram, LCSW 11/22/2023, 9:36 AM

## 2023-11-23 NOTE — Discharge Summary (Signed)
 Physician Discharge Summary Note  Patient:  [name] is an 43 y.o., female MRN:  [id number] DOB:  1980/11/09 Patient phone:  [phone] (home)  Patient address:   [address] [place] [address],    Date of Admission:  11/16/2023 Date of Discharge: 11/22/23  Reason for Admission:  [name] is a 43 year old female with a history of depression, borderline personality disorder, PTSD, bipolar II disorder, alcohol use disorder, stimulant use disorder, chronic pain, discharged from psychiatric hospitalization 3/31, who presents to the ED with suicidal ideation. Chart reviewed, ethanol 38, UDS negative. Psychiatry consulted for disposition recommendations. On evaluation, patient noted to be tearful, dysphoric, linear, not appearing internally preoccupied, not responding to internal stimuli, alert and oriented x 4. Patient endorses active suicidal ideation with intent and plan to overdose on medication. Patient is admitted to [facility] unit with Q15 min safety monitoring. Multidisciplinary team approach is offered. Medication management; group/milieu therapy is offered.   Principal Problem: Bipolar 2 disorder, major depressive episode [facility] [address]) Discharge Diagnoses: Principal Problem:   Bipolar 2 disorder, major depressive episode (HCC)   Past Psychiatric History: see h&p  Family Psychiatric  History: see h&p Social History:  Social History   Substance and Sexual Activity  Alcohol Use Yes   Comment: 1 beer 11/15/2023     Social History   Substance and Sexual Activity  Drug Use Yes   Types: Marijuana   Comment: pt denies (there is documented hx cannibus use)    Social History   Socioeconomic History   Marital status: Married    Spouse name: Not on file   Number of children: Not on file   Years of education: Not on file   Highest education level: Not on file  Occupational History   Not on file  Tobacco Use   Smoking status: Every Day    Current packs/day: 0.50     Average packs/day: 0.5 packs/day for 15.0 years (7.5 ttl pk-yrs)    Types: Cigarettes   Smokeless tobacco: Never  Vaping Use   Vaping status: Never Used  Substance and Sexual Activity   Alcohol use: Yes    Comment: 1 beer 11/15/2023   Drug use: Yes    Types: Marijuana    Comment: pt denies (there is documented hx cannibus use)   Sexual activity: Yes    Birth control/protection: Surgical  Other Topics Concern   Not on file  Social History Narrative   Pt also has hx of an aunt and uncle with lung cancer.          Social Drivers of Corporate investment banker Strain: Not on file  Food Insecurity: No Food Insecurity (11/16/2023)   Hunger Vital Sign    Worried About Running Out of Food in the Last Year: Never true    Ran Out of Food in the Last Year: Never true  Recent Concern: Food Insecurity - Food Insecurity Present (11/10/2023)   Hunger Vital Sign    Worried About Running Out of Food in the Last Year: Sometimes true    Ran Out of Food in the Last Year: Sometimes true  Transportation Needs: No Transportation Needs (11/16/2023)   PRAPARE - Administrator, Civil Service (Medical): No    Lack of Transportation (Non-Medical): No  Physical Activity: Not on file  Stress: Not on file  Social Connections: Unknown (12/28/2021)   [facility]   Social Network    Social Network: Not  on file   Past Medical History:  Past Medical History:  Diagnosis Date   Alcohol abuse    Borderline personality disorder (HCC) 11/15/2023   H/O psychiatric hospitalization 11/15/2023   History of cervical dysplasia    CIN I  in  2004   Inflamed external hemorrhoid    Inflamed internal hemorrhoid    Major depression    PTSD (post-traumatic stress disorder)    Rectal pain    Scoliosis 11/12/2012   Suicide attempt (HCC) 11/15/2023    Past Surgical History:  Procedure Laterality Date   APPENDECTOMY  08/17/1992   CERVICAL BIOPSY  W/ LOOP ELECTRODE EXCISION   06/11/2003   EVALUATION UNDER ANESTHESIA WITH HEMORRHOIDECTOMY N/A 01/22/2014   Procedure: EXAM UNDER ANESTHESIA WITH HEMORRHOIDECTOMY;  Surgeon: Romie Levee, MD;  Location: [facility];  Service: General;  Laterality: N/A;   FOOT SURGERY Right    KNEE SURGERY Bilateral    PELVIC FRACTURE SURGERY     TUBAL LIGATION  05/08/2005   W/  FILSHIE CLIPS   Family History:  Family History  Problem Relation Age of Onset   Cancer Father        prostate   Cancer Sister        bladder   Lung disease Mother    Cancer Maternal Uncle        Lung   Cancer Paternal Aunt        Breast   Cancer Maternal Grandfather    Cancer Paternal Grandfather    Cancer Paternal Aunt        Breast    Hospital Course:  [name] is a 43 year old female with a history of depression, borderline personality disorder, PTSD, bipolar II disorder, alcohol use disorder, stimulant use disorder, chronic pain, discharged from psychiatric hospitalization 3/31, who presents to the ED with suicidal ideation. Chart reviewed, ethanol 38, UDS negative. Psychiatry consulted for disposition recommendations. On evaluation, patient noted to be tearful, dysphoric, linear, not appearing internally preoccupied, not responding to internal stimuli, alert and oriented x 4. Patient endorses active suicidal ideation with intent and plan to overdose on medication. Patient is admitted to [facility] unit with Q15 min safety monitoring. Multidisciplinary team approach is offered. Medication management; group/milieu therapy is offered.  Patient is started on her home medications Seroquel 200 mg nightly, BuSpar 10 mg 3 times daily, Zoloft 50 mg, gabapentin 600 mg 3 times daily.  Patient was on CIWA protocol to monitor for alcohol withdrawal.  Patient managed very well on home medications with no reported side effects.  Patient has displayed safe behaviors and calm nature during her stay.  She had few arguments with the staff but she was  redirectable.  Patient has not displayed any psychotic symptoms or SI/HI/intent/plan during the hospitalization.  She participated in the treatment plan.  On the day of discharge patient denied SI/HI/intent/plan, denies auditory/visual hallucinations.  She remains future oriented and is willing to participate in outpatient mental health services.  Discussed with her husband who expressed his frustration about patient's alcohol use and mood lability.  He was educated about outpatient resources offered to the patient and her agreement to participate in outpatient resources.  Husband has confirmed no access to guns or firearms in the house.  Patient was discharged home to her family.  Physical Findings: AIMS:  , ,  ,  ,    CIWA:    COWS:        Psychiatric Specialty Exam:  Presentation  General Appearance:  Appropriate for Environment; Casual  Eye Contact: Fair  Speech: Clear and Coherent  Speech Volume: Normal    Mood and Affect  Mood: Euthymic  Affect: Appropriate   Thought Process  Thought Processes: Coherent  Descriptions of Associations:Intact  Orientation:Full (Time, Place and Person)  Thought Content:Logical  Hallucinations:Hallucinations: None  Ideas of Reference:None  Suicidal Thoughts:Suicidal Thoughts: No  Homicidal Thoughts:Homicidal Thoughts: No   Sensorium  Memory: Immediate Fair; Recent Fair; Remote Fair  Judgment: Fair  Insight: Fair   Art therapist  Concentration: Fair  Attention Span: Fair  Recall: [employer] of Knowledge: Fair  Language: Fair   Psychomotor Activity  Psychomotor Activity: Psychomotor Activity: Normal  Musculoskeletal: Strength & Muscle Tone: within normal limits Gait & Station: normal Assets  Assets: Manufacturing systems engineer; Desire for Improvement; Physical Health   Sleep  Sleep: Sleep: Fair    Physical Exam: Physical Exam Vitals and nursing note reviewed.  HENT:     Head:  Normocephalic.     Nose: Nose normal.     Mouth/Throat:     Mouth: Mucous membranes are moist.  Cardiovascular:     Rate and Rhythm: Normal rate.  Pulmonary:     Effort: Pulmonary effort is normal.  Abdominal:     General: Bowel sounds are normal.  Musculoskeletal:        General: Normal range of motion.  Neurological:     General: No focal deficit present.     Mental Status: She is alert.    Review of Systems  Constitutional: Negative.   HENT: Negative.    Eyes: Negative.   Cardiovascular: Negative.   Skin: Negative.   Neurological: Negative.    Blood pressure 119/85, pulse 91, temperature 97.9 F (36.6 C), resp. rate 20, last menstrual period 11/07/2023, SpO2 97%. There is no height or weight on file to calculate BMI.   Social History   Tobacco Use  Smoking Status Every Day   Current packs/day: 0.50   Average packs/day: 0.5 packs/day for 15.0 years (7.5 ttl pk-yrs)   Types: Cigarettes  Smokeless Tobacco Never   Tobacco Cessation:  A prescription for an FDA-approved tobacco cessation medication provided at discharge   Blood Alcohol level:  Lab Results  Component Value Date   ETH 38 (H) 11/15/2023   ETH 106 (H) 11/08/2023    Metabolic Disorder Labs:  Lab Results  Component Value Date   HGBA1C 5.2 11/19/2022   MPG 103 11/19/2022   MPG 103 01/30/2020   No results found for: "PROLACTIN" Lab Results  Component Value Date   CHOL 186 11/19/2022   TRIG 176 (H) 11/19/2022   HDL 45 11/19/2022   CHOLHDL 4.1 11/19/2022   VLDL 35 11/19/2022   LDLCALC 106 (H) 11/19/2022   LDLCALC 82 01/30/2020    See Psychiatric Specialty Exam and Suicide Risk Assessment completed by Attending Physician prior to discharge.  Discharge destination:  Home  Is patient on multiple antipsychotic therapies at discharge:  No   Has Patient had three or more failed trials of antipsychotic monotherapy by history:  No  Recommended Plan for Multiple Antipsychotic  Therapies: NA  Discharge Instructions     Diet - low sodium heart healthy   Complete by: As directed    Increase activity slowly   Complete by: As directed       Allergies as of 11/22/2023   No Known Allergies      Medication List     STOP taking these medications  gabapentin 600 MG tablet Commonly known as: NEURONTIN Replaced by: gabapentin 300 MG capsule   oxyCODONE-acetaminophen 5-325 MG tablet Commonly known as: PERCOCET/ROXICET   Refresh Tears PF 0.5-0.9 % Soln Generic drug: Carboxymethylcell-Glycerin PF       TAKE these medications      Indication  acetaminophen 500 MG tablet Commonly known as: TYLENOL Take 1,000 mg by mouth See admin instructions. Take 1,000 mg by mouth every 6-8 hours and cannot exceed a sum total of 4,000 mg/24 hours from ALL combined sources  Indication: Pain   aspirin EC 81 MG tablet Take 1 tablet (81 mg total) by mouth daily. Swallow whole. What changed: additional instructions  Indication: Treatment to Prevent Disease with Thrombosis or Embolism   busPIRone 10 MG tablet Commonly known as: BUSPAR Take 1 tablet (10 mg total) by mouth 3 (three) times daily.  Indication: Anxiety Disorder   clopidogrel 75 MG tablet Commonly known as: PLAVIX Take 1 tablet (75 mg total) by mouth daily.  Indication: Treatment to Prevent Disease with Thrombosis or Embolism   folic acid 1 MG tablet Commonly known as: FOLVITE Take 1 tablet (1 mg total) by mouth daily.  Indication: Anemia From Inadequate Folic Acid   gabapentin 300 MG capsule Commonly known as: NEURONTIN Take 2 capsules (600 mg total) by mouth 3 (three) times daily. Replaces: gabapentin 600 MG tablet  Indication: Abuse or Misuse of Alcohol   hydrOXYzine 50 MG tablet Commonly known as: ATARAX Take 1 tablet (50 mg total) by mouth 3 (three) times daily as needed for anxiety. What changed:  medication strength how much to take  Indication: Feeling Anxious   lamoTRIgine 200 MG  tablet Commonly known as: LAMICTAL Take 1 tablet (200 mg total) by mouth daily.  Indication: Depressive Phase of Manic-Depression   lip balm ointment Apply 1 Application topically as needed (for dry lips).  Indication: dry lips   naloxone 4 MG/0.1ML Liqd nasal spray kit Commonly known as: NARCAN Place 1 spray into the nose as needed (AS DIRECTED BY MD).  Indication: Opioid Overdose   naproxen 250 MG tablet Commonly known as: NAPROSYN Take 1 tablet (250 mg total) by mouth 2 (two) times daily with a meal.  Indication: Pain   nicotine 21 mg/24hr patch Commonly known as: NICODERM CQ - dosed in mg/24 hours Place 1 patch (21 mg total) onto the skin daily.  Indication: Nicotine Addiction   polyvinyl alcohol 1.4 % ophthalmic solution Commonly known as: LIQUIFILM TEARS Place 1 drop into both eyes 4 (four) times daily as needed for dry eyes.  Indication: dry eyes   QUEtiapine 200 MG tablet Commonly known as: SEROQUEL Take 1 tablet (200 mg total) by mouth at bedtime.  Indication: Manic Phase of Manic-Depression   rOPINIRole 2 MG tablet Commonly known as: REQUIP Take 1 tablet (2 mg total) by mouth daily at 8 pm.  Indication: Restless Leg Syndrome   sertraline 50 MG tablet Commonly known as: ZOLOFT Take 1 tablet (50 mg total) by mouth daily.  Indication: Major Depressive Disorder   traZODone 50 MG tablet Commonly known as: DESYREL Take 1 tablet (50 mg total) by mouth at bedtime.  Indication: Trouble Sleeping        Follow-up [facility]. Go to.   Why: Virtual appointment is 10/26/23 at 3 PM. During this initial appointment, if eligible, you will be set up with ACT team and needed services. Contact information: [address]  [address] [place] [address] [phone]  [facility] Follow up.   Why: They have a chemical dependent intensive outpatient program you may be appropriate for.  Please call Monday at 8AM to see if determine  if appropriate for service, [phone]. Contact information: [address] [place] [address]                Follow-up recommendations:  Activity:  As tolerated    Signed: Verner Chol, MD 11/23/2023, 8:47 PM

## 2024-03-20 ENCOUNTER — Ambulatory Visit: Payer: MEDICAID | Admitting: Physician Assistant

## 2024-03-20 VITALS — BP 130/76 | HR 88 | Ht 66.0 in | Wt 185.0 lb

## 2024-03-20 DIAGNOSIS — N946 Dysmenorrhea, unspecified: Secondary | ICD-10-CM

## 2024-03-20 DIAGNOSIS — N926 Irregular menstruation, unspecified: Secondary | ICD-10-CM

## 2024-03-20 DIAGNOSIS — F332 Major depressive disorder, recurrent severe without psychotic features: Secondary | ICD-10-CM

## 2024-03-20 DIAGNOSIS — M2550 Pain in unspecified joint: Secondary | ICD-10-CM

## 2024-03-20 DIAGNOSIS — K5900 Constipation, unspecified: Secondary | ICD-10-CM | POA: Diagnosis not present

## 2024-03-20 DIAGNOSIS — E559 Vitamin D deficiency, unspecified: Secondary | ICD-10-CM | POA: Diagnosis not present

## 2024-03-20 DIAGNOSIS — F319 Bipolar disorder, unspecified: Secondary | ICD-10-CM

## 2024-03-20 DIAGNOSIS — G2581 Restless legs syndrome: Secondary | ICD-10-CM

## 2024-03-20 DIAGNOSIS — F1010 Alcohol abuse, uncomplicated: Secondary | ICD-10-CM

## 2024-03-20 MED ORDER — VITAMIN D (ERGOCALCIFEROL) 1.25 MG (50000 UNIT) PO CAPS: 50000.0000 [IU] | ORAL_CAPSULE | ORAL | 2 refills | Status: AC

## 2024-03-20 MED ORDER — VAGISIL INTIMATE WIPES MISC: 1.0000 | Freq: Every day | 1 refills | Status: AC | PRN

## 2024-03-20 MED ORDER — POLYETHYLENE GLYCOL 3350 17 GM/SCOOP PO POWD
ORAL | 1 refills | Status: AC
Start: 2024-03-20 — End: ?

## 2024-03-20 MED ORDER — ROPINIROLE HCL 3 MG PO TABS: 3.0000 mg | ORAL_TABLET | Freq: Every day | ORAL | 1 refills | Status: AC

## 2024-03-20 NOTE — Progress Notes (Unsigned)
 Established Patient Office Visit  Subjective   Patient ID: [name], female    DOB: [birth year]  Age: 43 y.o. MRN: [id number]  Chief Complaint  Patient presents with   Restless Legs    History of Present Illness [name] is a 43 year old female who presents with concerns about constipation and restless legs.  She is undergoing treatment for substance abuse at [facility] recovery and plans to transition to [facility]. She recently started experiencing significant constipation, using Milk of Magnesia every other day, with three uses last week. Without it, she experiences straining and rectal pain, but no rectal bleeding. She has gained 20 pounds, which she attributes to starting Seroquel  400 mg recently, a medication she finds beneficial.  She has had restless legs for many years and was started on ropinirole  2 mg, but it is no longer as effective.  She believes she is in perimenopause, experiencing prolonged menstrual periods, with the last one lasting two months. She feels overdue for her next period. She requests written permission to use a vaginal douche, as it is not allowed at [facility] without such permission.       Past Medical History:  Diagnosis Date   Alcohol  abuse    Borderline personality disorder (HCC) 11/15/2023   H/O psychiatric hospitalization 11/15/2023   History of cervical dysplasia    CIN I  in  2004   Inflamed external hemorrhoid    Inflamed internal hemorrhoid    Major depression    PTSD (post-traumatic stress disorder)    Rectal pain    Scoliosis 11/12/2012   Suicide attempt (HCC) 11/15/2023   Social History   Socioeconomic History   Marital status: Married    Spouse name: Not on file   Number of children: Not on file   Years of education: Not on file   Highest education level: Not on file  Occupational History   Not on file  Tobacco Use   Smoking status: Every Day    Current packs/day: 0.50    Average packs/day: 0.5 packs/day  for 15.0 years (7.5 ttl pk-yrs)    Types: Cigarettes   Smokeless tobacco: Never  Vaping Use   Vaping status: Never Used  Substance and Sexual Activity   Alcohol  use: Yes    Comment: 1 beer 11/15/2023   Drug use: Yes    Types: Marijuana    Comment: pt denies (there is documented hx cannibus use)   Sexual activity: Yes    Birth control/protection: Surgical  Other Topics Concern   Not on file  Social History Narrative   Pt also has hx of an aunt and uncle with lung cancer.          Social Drivers of Corporate investment banker Strain: Not on file  Food Insecurity: No Food Insecurity (11/16/2023)   Hunger Vital Sign    Worried About Running Out of Food in the Last Year: Never true    Ran Out of Food in the Last Year: Never true  Recent Concern: Food Insecurity - Food Insecurity Present (11/10/2023)   Hunger Vital Sign    Worried About Running Out of Food in the Last Year: Sometimes true    Ran Out of Food in the Last Year: Sometimes true  Transportation Needs: No Transportation Needs (11/16/2023)   PRAPARE - Administrator, Civil Service (Medical): No    Lack of Transportation (Non-Medical): No  Physical Activity: Not on file  Stress: Not on  file  Social Connections: Unknown (12/28/2021)   Received from [facility]   Social Network    Social Network: Not on file  Intimate Partner Violence: Not At Risk (11/16/2023)   Humiliation, Afraid, Rape, and Kick questionnaire    Fear of Current or Ex-Partner: No    Emotionally Abused: No    Physically Abused: No    Sexually Abused: No   Family History  Problem Relation Age of Onset   Cancer Father        prostate   Cancer Sister        bladder   Lung disease Mother    Cancer Maternal Uncle        Lung   Cancer Paternal Aunt        Breast   Cancer Maternal Grandfather    Cancer Paternal Grandfather    Cancer Paternal Aunt        Breast   No Known Allergies  Review of Systems  Constitutional: Negative.   HENT:  Negative.    Eyes: Negative.   Respiratory:  Negative for shortness of breath.   Cardiovascular:  Negative for chest pain.  Gastrointestinal:  Positive for constipation. Negative for blood in stool, diarrhea, nausea and vomiting.  Genitourinary: Negative.   Musculoskeletal: Negative.   Skin: Negative.   Neurological: Negative.   Endo/Heme/Allergies: Negative.   Psychiatric/Behavioral: Negative.        Objective:     BP 130/76 (BP Location: Left Arm, Patient Position: Sitting, Cuff Size: Large)   Pulse 88   Ht 5' 6 (1.676 m)   Wt 185 lb (83.9 kg)   SpO2 96%   BMI 29.86 kg/m  BP Readings from Last 3 Encounters:  03/21/24 130/76  11/16/23 (!) 145/93  11/10/23 133/89   Wt Readings from Last 3 Encounters:  03/21/24 185 lb (83.9 kg)  11/15/23 175 lb (79.4 kg)  09/02/23 173 lb 15.1 oz (78.9 kg)    Physical Exam Vitals and nursing note reviewed.    GENERAL: Alert, cooperative, well developed, no acute distress HEENT: Normocephalic, normal oropharynx, moist mucous membranes CHEST: Clear to auscultation bilaterally, No wheezes, rhonchi, or crackles CARDIOVASCULAR: Normal heart rate and rhythm, S1 and S2 normal without murmurs ABDOMEN: Soft, non-tender, non-distended, without organomegaly, Normal bowel sounds EXTREMITIES: No cyanosis or edema NEUROLOGICAL: Cranial nerves grossly intact, Moves all extremities without gross motor or sensory deficit   Assessment & Plan:   Problem List Items Addressed This Visit       Genitourinary   Dysmenorrhea   Relevant Medications   Incontinence Supply Disposable (VAGISIL INTIMATE WIPES) MISC     Other   Alcohol  abuse (Chronic)   Polyarthralgia   Restless legs - Primary   Relevant Medications   rOPINIRole  (REQUIP ) 3 MG tablet   Vitamin D  deficiency   Relevant Medications   Vitamin D , Ergocalciferol , (DRISDOL ) 1.25 MG (50000 UNIT) CAPS capsule   Other Visit Diagnoses       Constipation, unspecified constipation type        Relevant Medications   polyethylene glycol powder (GLYCOLAX /MIRALAX ) 17 GM/SCOOP powder     Assessment and Plan Constipation Chronic constipation with straining and rectal pain. - Encouraged use of Miralax  instead of Milk of Magnesia. - Advised to drink at least 64 ounces of water daily.  Perimenopausal symptoms with abnormal uterine bleeding Perimenopausal symptoms with prolonged menstrual periods. - Discouraged use of vaginal douches. - Provided printed prescription for Vagisil cleansing wipes.  Restless legs syndrome Chronic restless legs syndrome,  previously managed with 2 mg of ropinirole , now ineffective. - Increased ropinirole  to 3 mg. - Follow up with the mobile unit in two weeks for further evaluation.[facility] may 23rd  -  caring services    I have reviewed the patient's medical history (PMH, PSH, Social History, Family History, Medications, and allergies) , and have been updated if relevant. I spent 30 minutes reviewing chart and  face to face time with patient.    Return in about 2 weeks (around 04/03/2024) for With MMU.    Kirk RAMAN Mayers, PA-C

## 2024-03-20 NOTE — Patient Instructions (Addendum)
 VISIT SUMMARY:  Today, we discussed your concerns about constipation, restless legs, and perimenopausal symptoms. We reviewed your current medications and made some adjustments to help manage your symptoms more effectively.  YOUR PLAN:  -CONSTIPATION: Constipation is when you have infrequent or difficult bowel movements. To help manage this, I recommend switching from Milk of Magnesia to Miralax  and drinking at least 64 ounces of water daily.  -PERIMENOPAUSAL SYMPTOMS WITH ABNORMAL UTERINE BLEEDING: Perimenopause is the transition period before menopause when you may experience irregular menstrual cycles. I advise against using vaginal douches and have provided a printed prescription for Vagisil white to help with your symptoms.  -RESTLESS LEGS SYNDROME: Restless legs syndrome is a condition that causes an uncontrollable urge to move your legs, usually due to discomfort. We have increased your ropinirole  dosage to 3 mg to help manage this. Please follow up with the mobile unit in two weeks for further evaluation.  Perimenopause: What to Know Perimenopause is the time in your life when your levels of estrogen start to go down. Estrogen is the female hormone made by your ovaries. Perimenopause can start 2-8 years before menopause. It can cause changes to your menstrual period. During this time, your ovaries may or may not make an egg. In many cases, you can still get pregnant. What are the causes? Perimenopause is a natural change in your homone levels that happens as you get older. What increases the risk? You're more likely to start perimenopause early if: You have an abnormal growth (tumor) of the pituitary gland in your brain. You have a disease that affects your ovaries. You've had certain treatments for cancer. These include: Chemotherapy. Hormone therapy. Radiation therapy on the area between your hips (pelvis). You smoke a lot or drink a lot of alcohol . Other family members have gone  through menopause early. What are the signs or symptoms? Symptoms are unique to each person. You may have: Hot flashes. Irregular periods. Night sweats. Changes in how you feel about sex. You may have less of a sex drive or feel more discomfort around your sexuality. Vaginal dryness. Headaches. Mood swings. Other symptoms may include: Depression. This is when you feel sad or hopeless. Trouble sleeping. Memory problems or trouble focusing. Irritability. This means getting annoyed easily. Tiredness. Weight gain. Anxiety. This is feeling worried or nervous. You can also have trouble getting pregnant. How is this diagnosed? You may be diagnosed based on: Your medical history. An exam. Your age. Your history of menstrual periods. Your symptoms. Hormone tests. How is this treated? In some cases, no treatment is needed. Talk with your health care provider about if you should get treated. Treatments may include: Menopausal hormone therapy (MHT). Medicines to treat certain symptoms. Acupuncture. Vitamin or herbal supplements. Before you start treatment, let your provider know if you or anyone in your family has or has had: Heart disease. Breast cancer. Blood clots. Diabetes. Osteoporosis. Follow these instructions at home: Eating and drinking  Eat a balanced diet. It should include: Fresh fruits and vegetables. Whole grains. Soybeans. Eggs. Lean meat. Low-fat dairy. To help prevent hot flashes, stay away from: Alcohol . Drinks with caffeine in them. Spicy foods. Lifestyle Do not smoke, vape, or use nicotine  or tobacco. Get at least 30 minutes of physical activity on 5 or more days each week. Get 7-8 hours of sleep each night. Dress in layers that can be taken off if you have a hot flash. Find ways to manage stress. You may want to try: Deep breathing.  Meditation. Writing in a journal. General instructions  Take your medicines only as told. Keep track of your  periods. Track: When they happen. How heavy they are. How long they last. How much time passes between periods. Keep track of your symptoms. Track: When they start. How often you have them. How long they last. Use vaginal lubricants or moisturizers. These can help with: Vaginal dryness. Comfort during sex. You can still get pregnant if you're having any periods. Make sure you use birth control if you don't want to get pregnant. Contact a health care provider if: You have a very heavy period or pass blood clots. Your period lasts more than 2 days longer than normal. Your period comes back sooner than 21 days. You bleed after having sex. You have pain during sex. You have pain when you pee. You get very bad headaches. You have trouble with your eyesight. Get help right away if: You have chest pain. You have trouble breathing. You have trouble talking. You have very bad depression. This information is not intended to replace advice given to you by your health care provider. Make sure you discuss any questions you have with your health care provider. Document Revised: 04/08/2023 Document Reviewed: 04/08/2023 [employer] Patient Education  2024 [employer].

## 2024-03-21 ENCOUNTER — Encounter: Payer: Self-pay | Admitting: Physician Assistant
